# Patient Record
Sex: Female | Born: 1980 | Race: Black or African American | Hispanic: No | Marital: Single | Smoking: Never smoker
Health system: Southern US, Community
[De-identification: ages and names within clinical notes are randomized; demographics above are authoritative.]

## PROBLEM LIST (undated history)

## (undated) DIAGNOSIS — R600 Localized edema: Secondary | ICD-10-CM

## (undated) DIAGNOSIS — E7800 Pure hypercholesterolemia, unspecified: Secondary | ICD-10-CM

## (undated) DIAGNOSIS — R7303 Prediabetes: Secondary | ICD-10-CM

## (undated) DIAGNOSIS — F419 Anxiety disorder, unspecified: Secondary | ICD-10-CM

## (undated) DIAGNOSIS — R0602 Shortness of breath: Secondary | ICD-10-CM

## (undated) DIAGNOSIS — K5900 Constipation, unspecified: Secondary | ICD-10-CM

## (undated) DIAGNOSIS — N979 Female infertility, unspecified: Secondary | ICD-10-CM

## (undated) DIAGNOSIS — I10 Essential (primary) hypertension: Secondary | ICD-10-CM

## (undated) DIAGNOSIS — M549 Dorsalgia, unspecified: Secondary | ICD-10-CM

## (undated) DIAGNOSIS — K219 Gastro-esophageal reflux disease without esophagitis: Secondary | ICD-10-CM

## (undated) DIAGNOSIS — Z8742 Personal history of other diseases of the female genital tract: Secondary | ICD-10-CM

## (undated) DIAGNOSIS — R079 Chest pain, unspecified: Secondary | ICD-10-CM

## (undated) HISTORY — DX: Pure hypercholesterolemia, unspecified: E78.00

## (undated) HISTORY — DX: Female infertility, unspecified: N97.9

## (undated) HISTORY — DX: Anxiety disorder, unspecified: F41.9

## (undated) HISTORY — DX: Shortness of breath: R06.02

## (undated) HISTORY — PX: DILATION AND CURETTAGE OF UTERUS: SHX78

## (undated) HISTORY — DX: Personal history of other diseases of the female genital tract: Z87.42

## (undated) HISTORY — DX: Localized edema: R60.0

## (undated) HISTORY — DX: Dorsalgia, unspecified: M54.9

## (undated) HISTORY — DX: Prediabetes: R73.03

## (undated) HISTORY — DX: Gastro-esophageal reflux disease without esophagitis: K21.9

## (undated) HISTORY — DX: Constipation, unspecified: K59.00

## (undated) HISTORY — DX: Chest pain, unspecified: R07.9

## (undated) HISTORY — PX: NO PAST SURGERIES: SHX2092

---

## 2013-04-21 ENCOUNTER — Encounter: Payer: Self-pay | Admitting: Maternal & Fetal Medicine

## 2013-05-24 ENCOUNTER — Encounter: Payer: Self-pay | Admitting: Pediatric Cardiology

## 2013-06-23 ENCOUNTER — Encounter: Payer: Self-pay | Admitting: Obstetrics and Gynecology

## 2013-06-23 LAB — FERRITIN: FERRITIN (ARMC): 10 ng/mL (ref 8–388)

## 2013-06-23 LAB — T4, FREE: Free Thyroxine: 0.84 ng/dL (ref 0.76–1.46)

## 2013-06-23 LAB — TSH: Thyroid Stimulating Horm: 4 u[IU]/mL

## 2013-06-27 ENCOUNTER — Encounter: Payer: Self-pay | Admitting: Maternal & Fetal Medicine

## 2013-06-30 ENCOUNTER — Encounter: Payer: Self-pay | Admitting: Maternal & Fetal Medicine

## 2013-07-04 ENCOUNTER — Observation Stay: Payer: Self-pay

## 2013-07-07 ENCOUNTER — Encounter: Payer: Self-pay | Admitting: Maternal and Fetal Medicine

## 2013-07-11 ENCOUNTER — Encounter: Payer: Self-pay | Admitting: Maternal & Fetal Medicine

## 2013-07-12 ENCOUNTER — Inpatient Hospital Stay: Payer: Self-pay

## 2013-07-12 LAB — CBC WITH DIFFERENTIAL/PLATELET
BASOS PCT: 1.3 %
Basophil #: 0.1 10*3/uL (ref 0.0–0.1)
EOS ABS: 0.1 10*3/uL (ref 0.0–0.7)
EOS PCT: 1.3 %
HCT: 33.4 % — ABNORMAL LOW (ref 35.0–47.0)
HGB: 10.8 g/dL — AB (ref 12.0–16.0)
LYMPHS PCT: 16 %
Lymphocyte #: 1.2 10*3/uL (ref 1.0–3.6)
MCH: 26.8 pg (ref 26.0–34.0)
MCHC: 32.2 g/dL (ref 32.0–36.0)
MCV: 83 fL (ref 80–100)
Monocyte #: 0.7 x10 3/mm (ref 0.2–0.9)
Monocyte %: 10 %
NEUTROS ABS: 5.2 10*3/uL (ref 1.4–6.5)
NEUTROS PCT: 71.4 %
Platelet: 272 10*3/uL (ref 150–440)
RBC: 4.01 10*6/uL (ref 3.80–5.20)
RDW: 13.9 % (ref 11.5–14.5)
WBC: 7.2 10*3/uL (ref 3.6–11.0)

## 2013-07-14 LAB — PLATELET COUNT: Platelet: 260 10*3/uL (ref 150–440)

## 2013-07-16 LAB — HEMATOCRIT: HCT: 30.7 % — ABNORMAL LOW (ref 35.0–47.0)

## 2014-04-24 ENCOUNTER — Emergency Department: Payer: Self-pay | Admitting: Emergency Medicine

## 2014-06-03 NOTE — Consult Note (Signed)
Referral Information:  Reason for Referral Chronic hypertension in pregnancy   Referring Physician [facility] Department   Prenatal Hx Sarah Burke is a 34 year-old G1 P0 at 51 2/7 weeks ([facility] 08/02/13) who presents for recommendations concerning the management of chronic hypertension in pregnancy. Sarah Burke was diagnosed with chronic hypertension approximately 5-6 years ago but has never required medications. She denies any end-organ complications associated with hypertension.  She also was recently diagnosed with polycystic ovarian syndrome, shortly before becoming pregnant.  She had an early three hour glucose tolerance test this pregnancy, which was normal.  She initiated her prenatal care in [place] but recently moved to [place] to be close to family.  Her BP at [facility] entry was 130/80.   Past Obstetrical Hx G1 P0   Home Medications: Medication Instructions Status  Prenatal Multivitamins Prenatal Multivitamins oral tablet 1 tab(s) orally once a day Active   Allergies:   No Known Allergies:   Vital Signs/Notes:  Nursing Vital Signs: **Vital Signs.:   12-Mar-15 08:00  Pulse Pulse 97  Systolic BP Systolic BP 135  Diastolic BP (mmHg) Diastolic BP (mmHg) 79   Perinatal Consult:  PGyn Hx Denies history of abnormal paps. She has had a remote history of chlamydia that was treated   Past Medical History cont'd Chronic HTN as in HPI Denies history of thyroid disorders or diabetes   PSurg Hx Wisdom teeth extraction, no complications   FHx Denies FH of birth defects, genetic disorders or mental retardation.   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 Lives with family   Occupation Father Lives in [place]. Not involved in the pregnancy currently   Soc Hx single, Denies use of ETOH, tobacco or drugs   Review Of Systems:  Subjective No complaints   Fever/Chills No   Abdominal Pain No   Nausea/Vomiting No   SOB/DOE No   Chest Pain No   Medications/Allergies Reviewed  Medications/Allergies reviewed   Exam:  Today's Weight 218, BMI 39.8    Additional Lab/Radiology Notes Prenatal labs (obtained from prenatal record): Blood type A positive, Antibody screen negative, RPR non-reactive, Hep B non-reactive, Rubella immune, Sickle screen normal, HIV non-reactive, GC/Chl negative, Hgb 12.2, Hgb electorphoresis normal, Early three hour GTT: 80/112/108/45, Varicella immune, Uric acid 4.5, AST 39, Plt count 369, Urine culture no growth  First trimester screen: Down syndrome risk 1 in 19,000, Trisomy 18 risk less than 1 in 100,000  MS AFP: Normal   Impression/Recommendations:  Impression 34 year-old G1 P0 at 25 2/7 weeks with history of chronic hypertension not requiring medications, obesity, and 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   Recommendations 1. Chronic hypertension.  Sarah Burke has never required antihypertensive therapy. It appears that her BPs during this pregnancy have also remained less than 140/90. Given her diagnosis and in the setting, I have recommended the use of  daily baby aspirin ( ) to decrease the risk for preeclampsia.  Baseline labs are normal. Please check urine protein as well if not already done. We reviewed the increased risk for preeclampsia as well as other complications associated with hypertension (maternal stroke, poor fetal growth, placental abruption, stillbirth).  If blood pressures remain less than 140/90, do not recommend initiating anti-hypertensive therapy.  If blood pressures increase to over 140/90 on two measurements, start labetalol 100 mg twice daily and refer back to [place] to discuss management for the remainder of the pregnancy.  If blood pressures remain normal, recommned a growth scan in the  third trimester (scheduled), weekly fetal testing to start at 36 weeks and delivery by due date. If requires anti-hypertensive therapy, refer back to develop further plan.  Recommned EKG. If  evidence of left ventricular hypertrophy, then recommend maternal echo as well.  2. Obesity. We reviewed complications associated with obesity and appropriate weight gain. Please repeat glucola at approximately 28 weeks to screen for gestational diabetes. We have recommended starting a daily baby aspirin.  3. Two vessel cord. We reviewed the implications of two vessel cord (see US report). She had normal first trimester screening and does not desire further genetic testing.  We have scheduled her for a fetal echo and growth scan.  4. The head measurements are as follows: BPD < 5% (though not 2.5 SD below the mean) and head circumference is at the 6%. The intracranial anatomy appears normal. Sarah Burke states that the father of the baby has a "small head".  This likely represents a normal finding.    Total Time Spent with Patient 60 minutes   >50% of visit spent in couseling/coordination of care yes   Office Use Only 99244  Level 4 (60min) NEW office consult low complexity   Coding Description: FETAL - 2nd/3rd TRIMESTER INDICATION(S).   Umbilical cord, 2 vessels, SUA (FDC echo indication).   MATERNAL CONDITIONS/HISTORY INDICATION(S).   Chronic HTN.   Obesity - BMI greater than equal to 30.  Electronic Signatures: Sarah Burke, [place] (MD)  (Signed 12-Mar-15 10:51)  Authored: Referral, Home Medications, Allergies, Vital Signs/Notes, Consult, Exam, Lab/Radiology Notes, Impression, Billing, Coding Description   Last Updated: 12-Mar-15 10:51 by Sarah Burke, [place] (MD)

## 2016-10-04 ENCOUNTER — Emergency Department
Admission: EM | Admit: 2016-10-04 | Discharge: 2016-10-04 | Disposition: A | Discharge status: Home / Self Care | Payer: Medicaid Other | Attending: Emergency Medicine | Admitting: Emergency Medicine

## 2016-10-04 DIAGNOSIS — R29810 Facial weakness: Secondary | ICD-10-CM | POA: Diagnosis present

## 2016-10-04 DIAGNOSIS — G510 Bell's palsy: Secondary | ICD-10-CM | POA: Insufficient documentation

## 2016-10-04 DIAGNOSIS — I10 Essential (primary) hypertension: Secondary | ICD-10-CM | POA: Insufficient documentation

## 2016-10-04 HISTORY — DX: Essential (primary) hypertension: I10

## 2016-10-04 MED ORDER — PREDNISONE 20 MG PO TABS: 60.0000 mg | ORAL_TABLET | Freq: Every day | ORAL | 0 refills | Status: AC

## 2016-10-04 MED ORDER — VALACYCLOVIR HCL 1 G PO TABS: 1000.0000 mg | ORAL_TABLET | Freq: Three times a day (TID) | ORAL | 0 refills | Status: AC

## 2016-10-04 MED ORDER — PREDNISONE 20 MG PO TABS
60.0000 mg | ORAL_TABLET | Freq: Once | ORAL | Status: AC
  Administered 2016-10-04: 60 mg via ORAL
  Filled 2016-10-04: qty 3

## 2016-10-04 MED ORDER — ARTIFICIAL TEARS OPHTHALMIC OINT: TOPICAL_OINTMENT | OPHTHALMIC | 2 refills | Status: AC

## 2016-10-04 NOTE — ED Notes (Signed)
Pt reports swelling to right side of face after eating food that she later found mold on 2 days ago.  Very slight swelling noted when compared to left side of face, throat inspected and no swelling noted.  Pt denies any problems breathing.  Pt c/o right sided headache.

## 2016-10-04 NOTE — ED Provider Notes (Signed)
[facility] Emergency Department Provider Note  ____________________________________________   I have reviewed the triage vital signs and the nursing notes.   HISTORY  Chief Complaint Facial Swelling    HPI Sarah Burke is a 36 y.o. female  Who presents today complaining that she has some degree of weakness in her right face. It began gradually yesterday. She has had tearing from her right eye, and she cannot taste anything. She denies any other neurologic deficit. She states that her face feels "funny". Is not swollen percent is no fever. There is not a lot of pain , rather there is aa sensation of uncomfortableness.no recent tick bites or travel no recent or historical herpetic lesions.  He states that she is having some tearing from her right eye.   Past Medical History:  Diagnosis Date  . Hypertension     There are no active problems to display for this patient.   History reviewed. No pertinent surgical history.  Prior to Admission medications   Not on File    Allergies Patient has no known allergies.  Family History  Problem Relation Age of Onset  . Hypertension Mother   . Diabetes Brother     Social History Social History  Substance Use Topics  . Smoking status: Never Smoker  . Smokeless tobacco: Never Used  . Alcohol use Yes     Comment: occassional    Review of Systems Constitutional: No fever/chills Eyes: No visual changes. ENT: No sore throat. No stiff neck no neck pain Cardiovascular: Denies chest pain. Respiratory: Denies shortness of breath. Gastrointestinal:   no vomiting.  No diarrhea.  No constipation. Genitourinary: Negative for dysuria. Musculoskeletal: Negative lower extremity swelling Skin: Negative for rash. Neurological: Negative for severe headaches, focal weakness or numbness.   ____________________________________________   PHYSICAL EXAM:  VITAL SIGNS: ED Triage Vitals  Enc Vitals Group   BP 10/04/16 1926 (!) 150/90     Pulse Rate 10/04/16 1926 66     Resp 10/04/16 1926 18     Temp 10/04/16 1926 98.9 F (37.2 C)     Temp Source 10/04/16 1926 Oral     SpO2 10/04/16 1926 99 %     Weight 10/04/16 1926 230 lb (104.3 kg)     Height 10/04/16 1926 5\' 2"  (1.575 m)     Head Circumference --      Peak Flow --      Pain Score 10/04/16 1925 9     Pain Loc --      Pain Edu? --      Excl. in GC? --     Constitutional: Alert and oriented. Well appearing and in no acute distress. Eyes: Conjunctivae are normal Head: Atraumatic HEENT: No congestion/rhinnorhea. Mucous membranes are moist.  Oropharynx non-erythematous Neck:   Nontender with no meningismus, no masses, no stridor Cardiovascular: Normal rate, regular rhythm. Grossly normal heart sounds.  Good peripheral circulation. Respiratory: Normal respiratory effort.  No retractions. Lungs CTAB. Abdominal: Soft and nontender. No distention. No guarding no rebound Back:  There is no focal tenderness or step off.  there is no midline tenderness there are no lesions noted. there is no CVA tenderness Musculoskeletal: No lower extremity tenderness, no upper extremity tenderness. No joint effusions, no DVT signs strong distal pulses no edema Neurologic:  Cranial nerves II through XII are grossly intact, with the exception of the seventh nerve which does involve the forehead. There is weakness on that side per she can close her eye  on the right completely however there is some degree of weakness associated with that as well. Sensation appears to be intact bilaterally. 5 out of 5 strength bilateral upper and lower extremity. Finger to nose within normal limits heel to shin within normal limits, speech is normal with no word finding difficulty or dysarthria, reflexes symmetric, pupils are equally round and reactive to light, there is no pronator drift, sensation is normal, vision is intact to confrontation, gait is deferred, there is no nystagmus,  normal neurologic exam no herpetic lesions noted Skin:  Skin is warm, dry and intact. No rash noted. Psychiatric: Mood and affect are normal. Speech and behavior are normal.  ____________________________________________   LABS (all labs ordered are listed, but only abnormal results are displayed)  Labs Reviewed - No data to display ____________________________________________  EKG  I personally interpreted any EKGs ordered by me or triage  ____________________________________________  RADIOLOGY  I reviewed any imaging ordered by me or triage that were performed during my shift and, if possible, patient and/or family made aware of any abnormal findings. ____________________________________________   PROCEDURES  Procedure(s) performed: None  Procedures  Critical Care performed: None  ____________________________________________   INITIAL IMPRESSION / ASSESSMENT AND PLAN / ED COURSE  Pertinent labs & imaging results that were available during my care of the patient were reviewed by me and considered in my medical decision making (see chart for details).  Patient here with what appears to be a Bell's palsy very low suspicion for centrally mediated CVA. Isolated Bell's palsy symptoms with no evidence of significant ocular involvement. We will start her on steroids, and valacyclovir although the studies are somewhat limited in terms of utility of this medication it is well tolerated. We'll start her on eyedrops, and close outpatient follow-up with neurology is advised as well as I did advise her to actually tape her eye shut if she cannot keep it closed while she sleeps. No evidence of ocular pathology at this time.    ____________________________________________   FINAL CLINICAL IMPRESSION(S) / ED DIAGNOSES  Final diagnoses:  None      This chart was dictated using voice recognition software.  Despite best efforts to proofread,  errors can occur which can change  meaning.      Jeanmarie Plant, MD 10/04/16 [phone]

## 2016-10-04 NOTE — ED Triage Notes (Signed)
Patient reports eating fast food yesterday and says she found mold on her food.    Patient reports approx 20 minutes later her eyes began to run and symptoms slowly progressed.  Patient currently c/o right-sided facial swelling, right throat discomfort/pressure, headache.   Patient able to speak in full sentences, maintain secretions, denies SOB and throat tightness.

## 2016-10-06 DIAGNOSIS — Z8669 Personal history of other diseases of the nervous system and sense organs: Secondary | ICD-10-CM | POA: Insufficient documentation

## 2016-10-06 DIAGNOSIS — G510 Bell's palsy: Secondary | ICD-10-CM

## 2017-07-29 DIAGNOSIS — F4323 Adjustment disorder with mixed anxiety and depressed mood: Secondary | ICD-10-CM | POA: Insufficient documentation

## 2017-12-31 ENCOUNTER — Other Ambulatory Visit (HOSPITAL_COMMUNITY)
Admission: RE | Admit: 2017-12-31 | Discharge: 2017-12-31 | Disposition: A | Discharge status: Home / Self Care | Payer: Medicaid Other | Source: Ambulatory Visit | Attending: Family Medicine | Admitting: Family Medicine

## 2017-12-31 ENCOUNTER — Ambulatory Visit: Payer: Medicaid Other | Admitting: Family Medicine

## 2017-12-31 ENCOUNTER — Encounter: Payer: Self-pay | Admitting: Family Medicine

## 2017-12-31 VITALS — BP 124/90 | HR 100 | Temp 98.3°F | Resp 16 | Ht 63.5 in | Wt 247.3 lb

## 2017-12-31 DIAGNOSIS — R102 Pelvic and perineal pain unspecified side: Secondary | ICD-10-CM

## 2017-12-31 DIAGNOSIS — G510 Bell's palsy: Secondary | ICD-10-CM

## 2017-12-31 DIAGNOSIS — R350 Frequency of micturition: Secondary | ICD-10-CM

## 2017-12-31 DIAGNOSIS — E66813 Obesity, class 3: Secondary | ICD-10-CM

## 2017-12-31 DIAGNOSIS — Z23 Encounter for immunization: Secondary | ICD-10-CM | POA: Diagnosis not present

## 2017-12-31 DIAGNOSIS — F4323 Adjustment disorder with mixed anxiety and depressed mood: Secondary | ICD-10-CM

## 2017-12-31 DIAGNOSIS — Z6841 Body Mass Index (BMI) 40.0 and over, adult: Secondary | ICD-10-CM | POA: Insufficient documentation

## 2017-12-31 NOTE — Progress Notes (Signed)
New Patient Office Visit  Subjective:  Patient ID: Sarah Burke, female    DOB: 04/03/1980  Age: 37 y.o. MRN: [id number]  CC:  Chief Complaint  Patient presents with  . Establish Care  . Vaginitis    recurrent bacteria infections    HPI Sarah Burke presents for establish care and for recurrent BV and Obesity.  Recurrent BV: She notes 1 new partner in the last year.  She notes new symptom onset a few weeks ago - went to urgent care and was given Abx, completed medications.  Still having vaginal pain, but no foul odor any more.  She was supposed to start her cycle a few days ago but it has not started. She states she was diagnosed with PCOS, but doesn't have records from previous PCP in [place].   Obesity: She was diagnosed with PCOS in [place], she was going to the gym daily for a month, gained weight and became frustrated, and stopped going to the gym.  She is open to seeing an nutritionist - we will refer and obtain labs.  History Bell's Palsey: She still has some tension on the right side, worsening over the last month - has been seeing neurology - is on a 1 year follow up at this point. She would like to take a steroid again to see if it helps this tension that has been worsening over the last month.  Reviewed notes from Dot Lanes NP, advised pt to call neurology office today.  Situational Anxiety and Depression: When she developed Bells Palsy last year, she started to reduce her stress, cut a lot of people out of her life. She says she has lost a lot of her desire to go out and socialize like she used to, still contacting family multiple times a week and seeing them in person frequently. Declines medication today, declines counseling today. PHQ-9 score of 1 today.  Denies SI/HI.   Past Medical History:  Diagnosis Date  . Hypertension     Past Surgical History:  Procedure Laterality Date  . NO PAST SURGERIES      Family History  Problem Relation Age of Onset    . Hypertension Mother   . Diabetes Brother     Social History   Socioeconomic History  . Marital status: Single    Spouse name: Not on file  . Number of children: 1  . Years of education: Not on file  . Highest education level: Not on file  Occupational History  . Not on file  Social Needs  . Financial resource strain: Not hard at all  . Food insecurity:    Worry: Never true    Inability: Never true  . Transportation needs:    Medical: No    Non-medical: No  Tobacco Use  . Smoking status: Never Smoker  . Smokeless tobacco: Never Used  Substance and Sexual Activity  . Alcohol use: Yes    Comment: occassional  . Drug use: Never  . Sexual activity: Not Currently    Partners: Male  Lifestyle  . Physical activity:    Days per week: 0 days    Minutes per session: 0 min  . Stress: Not at all  Relationships  . Social connections:    Talks on phone: More than three times a week    Gets together: More than three times a week    Attends religious service: 1 to 4 times per year    Active member of club or  organization: No    Attends meetings of clubs or organizations: Never    Relationship status: Never married  . Intimate partner violence:    Fear of current or ex partner: No    Emotionally abused: No    Physically abused: No    Forced sexual activity: No  Other Topics Concern  . Not on file  Social History Narrative  . Not on file    ROS Review of Systems  Constitutional: Negative for fatigue, fever and unexpected weight change.  HENT: Negative.   Eyes: Negative.   Respiratory: Negative for cough and shortness of breath.   Cardiovascular: Negative for chest pain.  Gastrointestinal: Negative for abdominal pain, blood in stool, constipation, diarrhea, nausea and vomiting.  Endocrine: Negative for heat intolerance.  Genitourinary: Positive for frequency. Negative for dysuria and flank pain.  Musculoskeletal: Negative for back pain and myalgias.  Skin: Negative.    Neurological: Positive for facial asymmetry. Negative for speech difficulty and numbness.  Psychiatric/Behavioral: Negative.   All other systems reviewed and are negative.   Objective:   Today's Vitals: BP 124/90 (BP Location: Right Arm, Patient Position: Sitting, Cuff Size: Large)   Pulse 100   Temp 98.3 F (36.8 C) (Oral)   Resp 16   Ht 5' 3.5" (1.613 m)   Wt 247 lb 4.8 oz (112.2 kg)   LMP 11/30/2017   SpO2 99%   BMI 43.12 kg/m   Physical Exam  Constitutional: Patient appears well-developed and well-nourished. No distress.  HENT: Head: Normocephalic and atraumatic. Ears: bilateral TMs with no erythema or effusion; Nose: Nose normal. Mouth/Throat: Oropharynx is clear and moist. No oropharyngeal exudate or tonsillar swelling.  Eyes: Conjunctivae and EOM are normal. No scleral icterus.  Pupils are equal, round, and reactive to light.  Neck: Normal range of motion. Neck supple. No JVD present. No thyromegaly present.  Cardiovascular: Normal rate, regular rhythm and normal heart sounds.  No murmur heard. No BLE edema. Pulmonary/Chest: Effort normal and breath sounds normal. No respiratory distress. Abdominal: Soft. Bowel sounds are normal, no distension. There is no tenderness. No masses. Musculoskeletal: Normal range of motion, no joint effusions. No gross deformities Neurological: Pt is alert and oriented to person, place, and time. Right eye has lower lid that is slightly raised and upper lid opens slightly less compared tot he left, otherwise no cranial nerve deficit. Coordination, balance, strength, speech and gait are normal.  Skin: Skin is warm and dry. No rash noted. No erythema.  Psychiatric: Patient has a normal mood and affect. behavior is normal. Judgment and thought content normal.  [facility] 12/31/2017  Decreased Interest 0  Down, Depressed, Hopeless 0  PHQ - 2 Score 0  Altered sleeping 1  Tired, decreased energy 0  Change in appetite 0  Feeling  bad or failure about yourself  0  Trouble concentrating 0  Moving slowly or fidgety/restless 0  Suicidal thoughts 0  PHQ-9 Score 1  Difficult doing work/chores Not difficult at all    Assessment & Plan:   Problem List Items Addressed This Visit      Nervous and Auditory   Facial paralysis/Bells palsy    She does have mild asymmetry to the right side of her face - eye and lip specifically. She will call her neurologist today to discuss current flare.  No other focal neuro deficit, current symptoms have been ongoing for several months, so we will not refer to ER today.  Other   Situational mixed anxiety and depressive disorder    Declines medication or counseling today. PHQ-9 is normal      Class 3 severe obesity due to excess calories with serious comorbidity and body mass index (BMI) of 40.0 to 44.9 in adult [facility]) - Primary   Relevant Orders   Amb ref to Medical Nutrition Therapy-MNT   Lipid panel   Insulin, random   COMPLETE METABOLIC PANEL WITH GFR   CBC w/Diff/Platelet   TSH   Hemoglobin A1c   Vaginal pain   Relevant Orders   Cervicovaginal ancillary only   Urinary frequency   Relevant Orders   Urine Culture    Other Visit Diagnoses    Need for Tdap vaccination       Relevant Orders   Tdap vaccine greater than or equal to 7yo IM (Completed)   Need for influenza vaccination       Relevant Orders   Flu Vaccine QUAD 6+ mos PF IM (Fluarix Quad PF) (Completed)     Outpatient Encounter Medications as of 12/31/2017  Medication Sig  . acetaminophen (TYLENOL) 500 MG tablet Take by mouth.   No facility-administered encounter medications on file as of 12/31/2017.     Follow-up: Return for 1 month CPE/50month follow up.   Doren Custard, FNP

## 2017-12-31 NOTE — Assessment & Plan Note (Signed)
Declines medication or counseling today. PHQ-9 is normal

## 2017-12-31 NOTE — Patient Instructions (Addendum)
Alphonzo LemmingsPotter, Zachary Eric, MD  [phone]  [facility]  [place] [address]  [phone]  [phone] ([address])    Jairo BenStepp, Ashley Renee, NP  [address]  [place], [place] [address]  [phone]  [phone] (Fax)   Please call your neurologist today regarding your increased tension on the right side of your face.

## 2017-12-31 NOTE — Assessment & Plan Note (Signed)
She does have mild asymmetry to the right side of her face - eye and lip specifically. She will call her neurologist today to discuss current flare.  No other focal neuro deficit, current symptoms have been ongoing for several months, so we will not refer to ER today.

## 2018-01-01 LAB — INSULIN, RANDOM: Insulin: 15.6 u[IU]/mL (ref 2.0–19.6)

## 2018-01-01 LAB — COMPLETE METABOLIC PANEL WITH GFR
AG RATIO: 1.4 (calc) (ref 1.0–2.5)
ALBUMIN MSPROF: 4.5 g/dL (ref 3.6–5.1)
ALT: 9 U/L (ref 6–29)
AST: 12 U/L (ref 10–30)
Alkaline phosphatase (APISO): 62 U/L (ref 33–115)
BILIRUBIN TOTAL: 0.4 mg/dL (ref 0.2–1.2)
BUN: 9 mg/dL (ref 7–25)
CHLORIDE: 104 mmol/L (ref 98–110)
CO2: 25 mmol/L (ref 20–32)
Calcium: 9.3 mg/dL (ref 8.6–10.2)
Creat: 0.84 mg/dL (ref 0.50–1.10)
GFR, EST AFRICAN AMERICAN: 104 mL/min/{1.73_m2} (ref >=60)
GFR, Est Non African American: 89 mL/min/{1.73_m2} (ref >=60)
GLUCOSE: 84 mg/dL (ref 65–99)
Globulin: 3.2 g/dL (calc) (ref 1.9–3.7)
Potassium: 4.3 mmol/L (ref 3.5–5.3)
Sodium: 137 mmol/L (ref 135–146)
TOTAL PROTEIN: 7.7 g/dL (ref 6.1–8.1)

## 2018-01-01 LAB — LIPID PANEL
CHOLESTEROL: 182 mg/dL (ref <200)
HDL: 48 mg/dL — AB (ref >50)
LDL Cholesterol (Calc): 116 mg/dL (calc) — ABNORMAL HIGH
Non-HDL Cholesterol (Calc): 134 mg/dL (calc) — ABNORMAL HIGH (ref <130)
TRIGLYCERIDES: 84 mg/dL (ref <150)
Total CHOL/HDL Ratio: 3.8 (calc) (ref <5.0)

## 2018-01-01 LAB — CERVICOVAGINAL ANCILLARY ONLY
BACTERIAL VAGINITIS: NEGATIVE
Candida vaginitis: POSITIVE — AB
Chlamydia: NEGATIVE
Neisseria Gonorrhea: NEGATIVE
Trichomonas: NEGATIVE

## 2018-01-01 LAB — CBC WITH DIFFERENTIAL/PLATELET
BASOS ABS: 29 {cells}/uL (ref 0–200)
Basophils Relative: 0.5 %
EOS PCT: 1.2 %
Eosinophils Absolute: 68 cells/uL (ref 15–500)
HCT: 38.1 % (ref 35.0–45.0)
Hemoglobin: 12.5 g/dL (ref 11.7–15.5)
LYMPHS ABS: 1568 {cells}/uL (ref 850–3900)
MCH: 27.3 pg (ref 27.0–33.0)
MCHC: 32.8 g/dL (ref 32.0–36.0)
MCV: 83.2 fL (ref 80.0–100.0)
MPV: 10.9 fL (ref 7.5–12.5)
Monocytes Relative: 8.1 %
NEUTROS PCT: 62.7 %
Neutro Abs: 3574 cells/uL (ref 1500–7800)
PLATELETS: 339 10*3/uL (ref 140–400)
RBC: 4.58 10*6/uL (ref 3.80–5.10)
RDW: 12.5 % (ref 11.0–15.0)
Total Lymphocyte: 27.5 %
WBC mixed population: 462 cells/uL (ref 200–950)
WBC: 5.7 10*3/uL (ref 3.8–10.8)

## 2018-01-01 LAB — HEMOGLOBIN A1C
HEMOGLOBIN A1C: 5.8 %{Hb} — AB (ref <5.7)
MEAN PLASMA GLUCOSE: 120 (calc)
eAG (mmol/L): 6.6 (calc)

## 2018-01-01 LAB — TSH: TSH: 2.1 m[IU]/L

## 2018-01-01 LAB — URINE CULTURE
MICRO NUMBER: 91405046
Result:: NO GROWTH
SPECIMEN QUALITY: ADEQUATE

## 2018-01-02 ENCOUNTER — Other Ambulatory Visit: Payer: Self-pay | Admitting: Family Medicine

## 2018-01-02 DIAGNOSIS — B3731 Acute candidiasis of vulva and vagina: Secondary | ICD-10-CM

## 2018-01-02 DIAGNOSIS — B373 Candidiasis of vulva and vagina: Secondary | ICD-10-CM

## 2018-01-02 MED ORDER — FLUCONAZOLE 150 MG PO TABS: 150.0000 mg | ORAL_TABLET | Freq: Once | ORAL | 0 refills | Status: AC

## 2018-01-04 ENCOUNTER — Encounter: Payer: Self-pay | Admitting: Family Medicine

## 2018-01-04 DIAGNOSIS — R7303 Prediabetes: Secondary | ICD-10-CM | POA: Insufficient documentation

## 2018-01-04 DIAGNOSIS — E8881 Metabolic syndrome: Secondary | ICD-10-CM | POA: Insufficient documentation

## 2018-01-04 DIAGNOSIS — E782 Mixed hyperlipidemia: Secondary | ICD-10-CM | POA: Insufficient documentation

## 2018-01-05 ENCOUNTER — Encounter: Payer: Self-pay | Admitting: Family Medicine

## 2018-01-11 ENCOUNTER — Telehealth: Payer: Self-pay

## 2018-01-11 DIAGNOSIS — R7303 Prediabetes: Secondary | ICD-10-CM

## 2018-01-11 DIAGNOSIS — Z6841 Body Mass Index (BMI) 40.0 and over, adult: Secondary | ICD-10-CM

## 2018-01-11 DIAGNOSIS — E782 Mixed hyperlipidemia: Secondary | ICD-10-CM

## 2018-01-11 DIAGNOSIS — E8881 Metabolic syndrome: Secondary | ICD-10-CM

## 2018-01-11 NOTE — Telephone Encounter (Signed)
Copied from CRM [id number]. Topic: Quick Communication - Rx Refill/Question >> Jan 11, 2018 12:14 PM Crist InfanteHarrald, Kathy J wrote: Medication:  Ozempic   Pt sent a mychart message and not sure when she is supposed to come in to learn how to do the pen? Does she need to wait until she picks up Rx and then come in?  Pt states she does not know what to do.

## 2018-01-12 MED ORDER — SEMAGLUTIDE(0.25 OR 0.5MG/DOS) 2 MG/1.5ML ~~LOC~~ SOPN: PEN_INJECTOR | SUBCUTANEOUS | 1 refills | Status: AC

## 2018-01-12 NOTE — Telephone Encounter (Signed)
Please let Ms. Openshaw know that [facility]  She can come in once the PA is completed and she has the pen from the pharmacy for a nurse visit to learn how to use the pen. I have also referred her to nutritionist.  Keep her upcoming follow up appointment with me.

## 2018-01-14 NOTE — Telephone Encounter (Signed)
Patient notified, will come by office once PA is approved. Medication still on hold at pharmacy

## 2018-01-25 ENCOUNTER — Encounter: Payer: Self-pay | Admitting: Dietician

## 2018-01-25 ENCOUNTER — Encounter: Payer: Medicaid Other | Attending: Family Medicine | Admitting: Dietician

## 2018-01-25 VITALS — Ht 63.0 in | Wt 243.2 lb

## 2018-01-25 DIAGNOSIS — Z6841 Body Mass Index (BMI) 40.0 and over, adult: Secondary | ICD-10-CM | POA: Diagnosis not present

## 2018-01-25 DIAGNOSIS — Z713 Dietary counseling and surveillance: Secondary | ICD-10-CM | POA: Insufficient documentation

## 2018-01-25 DIAGNOSIS — R7303 Prediabetes: Secondary | ICD-10-CM | POA: Diagnosis not present

## 2018-01-25 DIAGNOSIS — E785 Hyperlipidemia, unspecified: Secondary | ICD-10-CM | POA: Diagnosis not present

## 2018-01-25 DIAGNOSIS — E669 Obesity, unspecified: Secondary | ICD-10-CM | POA: Insufficient documentation

## 2018-01-25 DIAGNOSIS — E282 Polycystic ovarian syndrome: Secondary | ICD-10-CM | POA: Diagnosis not present

## 2018-01-25 DIAGNOSIS — I10 Essential (primary) hypertension: Secondary | ICD-10-CM | POA: Diagnosis not present

## 2018-01-25 NOTE — Progress Notes (Signed)
Medical Nutrition Therapy: Visit start time: 1430  end time: 1530  Assessment:  Diagnosis: pre-diabetes, obesity Past medical history: HTN, PCOS, hyperlipidemia Psychosocial issues/ stress concerns: none  Preferred learning method:  Jill Alexanders. Visual . Hands-on   Current weight: 243.2lbs Height: 5'3" Medications, supplements: reconciled list in medical record  Progress and evaluation: Patient reports past dieting history of 30-day cleanse diet; green smoothie cleanse; slim-fast -- with only short-term success. Reports craving carbohydrate foods since >4 years ago, and was diagnosed with PCOS. Has been working to reduce sugar intake, including sodas. She is concerned about knowing what she can eat and should not eat, and expanding the variety of foods she is eating. She had been exercising often but gained weight, and has drifted away from regular exercise.    Physical activity: cardio/ walking 30-60 minutes, 1-2x a week.   Dietary Intake:  Usual eating pattern includes 2-3 meals and 1 snacks per day. Dining out frequency: 0-2 meals per week.  Breakfast: occasionally skips; maple cheerios or oatmeal, or 1 pancake with banana no syrup, protein shake Snack: none recently Lunch: chicken strips cooked in air fryer + vegetables ie broccoli, spinach; [place]key sandwich with spinach Snack: none recently Supper: brown rice or whole grain pasta with meat Snack: low sodium triscuit crackers, gluten free pretzels Beverages: water, stopped sodas  Nutrition Care Education: Topics covered: weight control, diabetes prevention, hypertension Basic nutrition: basic food groups, appropriate nutrient balance, appropriate meal and snack schedule, general nutrition guidelines    Weight control: benefits of weight control, appropriate food portions and balance, importance of low sugar and low fat food choices; discussed food portions for about 1500kcal daily intake for weight loss, role of exercise Advanced nutrition:   recipe modification, cooking techniques, dining out, food label reading Diabetes:  appropriate meal and snack schedule, appropriate carb intake and balance, healthy carb choices Hypertension:  identifying high sodium foods, goal for daily sodium intake, identifying food sources of potassium, magnesium   Nutritional Diagnosis:  [place]-2.2 Altered nutrition-related laboratory As related to pre-diabetes.  As evidenced by patient with recent HbA1C of 5.8%. [place]-3.3 Overweight/obesity As related to excess calories, inactivity, PCOS.  As evidenced by patient with current BMI of 43, and patient report of diet and activity history.  Intervention:   Instruction as noted above.  Patient has been working on positive diet changes, and is motivated to continue.  Set goals with direction from patient.  Education Materials given:  . Plate Planner with food lists . Sample menus . Snacking handout . Goals/ instructions   Learner/ who was taught:  . Patient    Level of understanding: Marland Kitchen. Verbalizes/ demonstrates competency   Demonstrated degree of understanding via:   Teach back Learning barriers: . None  Willingness to learn/ readiness for change: . Eager, change in progress   Monitoring and Evaluation:  Dietary intake, exercise, BG control, and body weight      follow up: 03/08/18

## 2018-01-25 NOTE — Patient Instructions (Signed)
   Goal for sodium each day is 2000mg  or less, with each meal 500-600mg  average.   Include large portions of low-carb veggies and control portions of starchy foods.  Try some sugar free flavored waters such as [employer] Splash. Tea without sugar or 1 tsp or less is also a good option.   Resume regular exercise, gradually work up to 30 minutes or more 5x a week, or 150 or more minutes weekly.

## 2018-02-02 ENCOUNTER — Ambulatory Visit: Payer: Medicaid Other | Admitting: Family Medicine

## 2018-02-02 ENCOUNTER — Other Ambulatory Visit (HOSPITAL_COMMUNITY)
Admission: RE | Admit: 2018-02-02 | Discharge: 2018-02-02 | Disposition: A | Discharge status: Home / Self Care | Payer: Medicaid Other | Source: Ambulatory Visit | Attending: Family Medicine | Admitting: Family Medicine

## 2018-02-02 ENCOUNTER — Encounter: Payer: Self-pay | Admitting: Family Medicine

## 2018-02-02 VITALS — BP 134/86 | HR 105 | Temp 97.7°F | Resp 16 | Ht 63.5 in | Wt 241.1 lb

## 2018-02-02 DIAGNOSIS — Z113 Encounter for screening for infections with a predominantly sexual mode of transmission: Secondary | ICD-10-CM

## 2018-02-02 DIAGNOSIS — Z0000 Encounter for general adult medical examination without abnormal findings: Secondary | ICD-10-CM | POA: Diagnosis not present

## 2018-02-02 DIAGNOSIS — Z01419 Encounter for gynecological examination (general) (routine) without abnormal findings: Secondary | ICD-10-CM | POA: Insufficient documentation

## 2018-02-02 DIAGNOSIS — R7303 Prediabetes: Secondary | ICD-10-CM

## 2018-02-02 DIAGNOSIS — Z1159 Encounter for screening for other viral diseases: Secondary | ICD-10-CM | POA: Diagnosis not present

## 2018-02-02 DIAGNOSIS — Z6841 Body Mass Index (BMI) 40.0 and over, adult: Secondary | ICD-10-CM

## 2018-02-02 DIAGNOSIS — E8881 Metabolic syndrome: Secondary | ICD-10-CM

## 2018-02-02 DIAGNOSIS — G4719 Other hypersomnia: Secondary | ICD-10-CM

## 2018-02-02 DIAGNOSIS — Z1231 Encounter for screening mammogram for malignant neoplasm of breast: Secondary | ICD-10-CM

## 2018-02-02 DIAGNOSIS — R0683 Snoring: Secondary | ICD-10-CM

## 2018-02-02 MED ORDER — METFORMIN HCL ER 500 MG PO TB24: 500.0000 mg | ORAL_TABLET | Freq: Every day | ORAL | 0 refills | Status: DC

## 2018-02-02 NOTE — Progress Notes (Signed)
Name: Sarah Burke   MRN: [id number]    DOB: 01-Oct-1980   Date:02/02/2018       Progress Note  Subjective  Chief Complaint  Chief Complaint  Patient presents with  . Annual Exam    HPI  Patient presents for annual CPE.  Diet: She saw nutritionist recently and liked it, is now trying to eat vegetarian - decreased sugar and salt intake.  Exercise: Doing small exercises at home while watching her daugher   USPSTF grade A and B recommendations    Office Visit from 12/31/2017 in [facility]  AUDIT-C Score  1     Depression:  Depression screen [facility] 2/9 02/02/2018 01/25/2018 12/31/2017  Decreased Interest 0 0 0  Down, Depressed, Hopeless 0 0 0  PHQ - 2 Score 0 0 0  Altered sleeping 0 - 1  Tired, decreased energy 0 - 0  Change in appetite 0 - 0  Feeling bad or failure about yourself  0 - 0  Trouble concentrating 0 - 0  Moving slowly or fidgety/restless 0 - 0  Suicidal thoughts 0 - 0  PHQ-9 Score 0 - 1  Difficult doing work/chores Not difficult at all - Not difficult at all   Hypertension: BP Readings from Last 3 Encounters:  02/02/18 134/86  12/31/17 124/90  10/04/16 (!) 135/91   Obesity: Down 6lbs since last visit. Wt Readings from Last 3 Encounters:  02/02/18 241 lb 1.6 oz (109.4 kg)  01/25/18 243 lb 3.2 oz (110.3 kg)  12/31/17 247 lb 4.8 oz (112.2 kg)   BMI Readings from Last 3 Encounters:  02/02/18 42.04 kg/m  01/25/18 43.08 kg/m  12/31/17 43.12 kg/m    Hep C Screening: We will check today STD testing and prevention (HIV/chl/gon/syphilis): We will check today Intimate partner violence: She is safe, no concerns Sexual History/Pain during Intercourse: No concerns Menstrual History/LMP/Abnormal Bleeding: LMP - 01/06/2018 Incontinence Symptoms: No concerns  Advanced Care Planning: A voluntary discussion about advance care planning including the explanation and discussion of advance directives.  Discussed health care proxy and Living  will, and the patient was able to identify a health care proxy as Mom - Sarah Burke.  Patient does not have a living will at present time. If patient does have living will, I have requested they bring this to the clinic to be scanned in to their chart.  Breast cancer: No personal history No results found for: HMMAMMO  BRCA gene screening: Maternal Grandmother, Maternal Aunt x2 only - no genetic testing was done, she notes that they were all older when they were diagnosed. Cervical cancer screening: Due for Pap today  Osteoporosis Screening: No family history No results found for: HMDEXASCAN  Lipids:  Lab Results  Component Value Date   CHOL 182 12/31/2017   Lab Results  Component Value Date   HDL 48 (L) 12/31/2017   Lab Results  Component Value Date   LDLCALC 116 (H) 12/31/2017   Lab Results  Component Value Date   TRIG 84 12/31/2017   Lab Results  Component Value Date   CHOLHDL 3.8 12/31/2017   No results found for: LDLDIRECT  Glucose:  Glucose, Bld  Date Value Ref Range Status  12/31/2017 84 65 - 99 mg/dL Final    Comment:    .            Fasting reference interval .     Skin cancer: No concerning lesions Colorectal cancer: Denies family or personal history of colorectal cancer,  no changes in BM's - no blood in stool, dark and tarry stool, mucus in stool, or constipation/diarrhea.   Lung cancer:  Never smoker; Low Dose CT Chest recommended if Age 45-80 years, 30 pack-year currently smoking OR have quit w/in 15years. Patient does not qualify.   ECG: Not on file.   Patient Active Problem List   Diagnosis Date Noted  . Metabolic syndrome 48/54/6270  . Prediabetes 01/04/2018  . Hyperlipidemia, mixed 01/04/2018  . Class 3 severe obesity due to excess calories with serious comorbidity and body mass index (BMI) of 40.0 to 44.9 in adult ([place]) 12/31/2017  . Vaginal pain 12/31/2017  . Urinary frequency 12/31/2017  . Situational mixed anxiety and depressive disorder  07/29/2017  . 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 NO PAST SURGERIES      Family History  Problem Relation Age of Onset  . Hypertension Mother   . Diabetes Brother   . Hypertension Maternal Grandmother   . Hypercholesterolemia Maternal Grandmother     Social History   Socioeconomic History  . Marital status: Single    Spouse name: Not on file  . Number of children: 1  . Years of education: Not on file  . Highest education level: Associate degree: occupational, Hotel manager, or vocational program  Occupational History  . Not on file  Social Needs  . Financial resource strain: Not hard at all  . Food insecurity:    Worry: Never true    Inability: Never true  . Transportation needs:    Medical: No    Non-medical: No  Tobacco Use  . Smoking status: Never Smoker  . Smokeless tobacco: Never Used  Substance and Sexual Activity  . Alcohol use: Yes    Comment: occassionally  . Drug use: Never  . Sexual activity: Yes    Partners: Male    Birth control/protection: None  Lifestyle  . Physical activity:    Days per week: 0 days    Minutes per session: 0 min  . Stress: Not at all  Relationships  . Social connections:    Talks on phone: More than three times a week    Gets together: More than three times a week    Attends religious service: 1 to 4 times per year    Active member of club or organization: No    Attends meetings of clubs or organizations: Never    Relationship status: Never married  . Intimate partner violence:    Fear of current or ex partner: No    Emotionally abused: No    Physically abused: No    Forced sexual activity: No  Other Topics Concern  . Not on file  Social History Narrative  . Not on file     Current Outpatient Medications:  .  acetaminophen (TYLENOL) 500 MG tablet, Take by mouth., Disp: , Rfl:  .  Semaglutide,0.25 or 0.5MG/DOS, (OZEMPIC, 0.25 OR 0.5 MG/DOSE,) 2 MG/1.5ML SOPN,  Inject 0.25 mg into the skin once a week for 30 days, THEN 0.5 mg once a week. (Patient not taking: Reported on 02/02/2018), Disp: 3 pen, Rfl: 1  No Known Allergies   ROS  Constitutional: Negative for fever or weight change.  Respiratory: Negative for cough and shortness of breath.   Cardiovascular: Negative for chest pain or palpitations.  Gastrointestinal: Negative for abdominal pain, no bowel changes.  Musculoskeletal: Negative for gait problem or joint swelling.  Skin: Negative for rash.  Neurological:  Negative for dizziness or headache.  No other specific complaints in a complete review of systems (except as listed in HPI above).  Objective  Vitals:   02/02/18 0817  BP: 134/86  Pulse: (!) 105  Resp: 16  Temp: 97.7 F (36.5 C)  TempSrc: Oral  SpO2: 99%  Weight: 241 lb 1.6 oz (109.4 kg)  Height: 5' 3.5" (1.613 m)    Body mass index is 42.04 kg/m.  Physical Exam  Constitutional: Patient appears well-developed and well-nourished. No distress.  HENT: Head: Normocephalic and atraumatic. Ears: B TMs ok, no erythema or effusion; Nose: Nose normal. Mouth/Throat: Oropharynx is clear and moist. No oropharyngeal exudate.  Eyes: Conjunctivae and EOM are normal. Pupils are equal, round, and reactive to light. No scleral icterus.  Neck: Normal range of motion. Neck supple. No JVD present. No thyromegaly present.  Cardiovascular: Normal rate, regular rhythm and normal heart sounds.  No murmur heard. No BLE edema. Pulmonary/Chest: Effort normal and breath sounds normal. No respiratory distress. Abdominal: Soft. Bowel sounds are normal, no distension. There is no tenderness. no masses Breast: no lumps or masses, no nipple discharge or rashes FEMALE GENITALIA:  External genitalia normal External urethra normal Vaginal vault normal without discharge or lesions Cervix normal without discharge or lesions Bimanual exam normal without masses RECTAL: no rectal masses or  hemorrhoids Musculoskeletal: Normal range of motion, no joint effusions. No gross deformities Neurological: he is alert and oriented to person, place, and time. No cranial nerve deficit. Coordination, balance, strength, speech and gait are normal.  Skin: Skin is warm and dry. No rash noted. No erythema.  Psychiatric: Patient has a normal mood and affect. behavior is normal. Judgment and thought content normal.  Recent Results (from the past 2160 hour(s))  Cervicovaginal ancillary only     Status: Abnormal   Collection Time: 12/31/17 12:00 AM  Result Value Ref Range   Bacterial vaginitis Negative for Bacterial Vaginitis Microorganisms     Comment: Normal Reference Range - Negative   Candida vaginitis **POSITIVE for Candida species** (A)     Comment: Normal Reference Range - Negative   Chlamydia Negative     Comment: Normal Reference Range - Negative   Neisseria gonorrhea Negative     Comment: Normal Reference Range - Negative   Trichomonas Negative     Comment: Normal Reference Range - Negative  Lipid panel     Status: Abnormal   Collection Time: 12/31/17  9:25 AM  Result Value Ref Range   Cholesterol 182 <200 mg/dL   HDL 48 (L) >50 mg/dL   Triglycerides 84 <150 mg/dL   LDL Cholesterol (Calc) 116 (H) mg/dL (calc)    Comment: Reference range: <100 . Desirable range <100 mg/dL for primary prevention;   <70 mg/dL for patients with CHD or diabetic patients  with > or = 2 CHD risk factors. Marland Kitchen LDL-C is now calculated using the Martin-Hopkins  calculation, which is a validated novel method providing  better accuracy than the Friedewald equation in the  estimation of LDL-C.  Cresenciano Genre et al. Annamaria Helling. 0865;784(69): 2061-2068  (http://education.[facility].com/faq/FAQ164)    Total CHOL/HDL Ratio 3.8 <5.0 (calc)   Non-HDL Cholesterol (Calc) 134 (H) <130 mg/dL (calc)    Comment: For patients with diabetes plus 1 major ASCVD risk  factor, treating to a non-HDL-C goal of <100 mg/dL   (LDL-C of <70 mg/dL) is considered a therapeutic  option.   Insulin, random     Status: None   Collection Time: 12/31/17  9:25  AM  Result Value Ref Range   Insulin 15.6 2.0 - 19.6 uIU/mL    Comment: This insulin assay shows strong cross-reactivity for some insulin analogs (lispro, aspart, and glargine) and much lower cross-reactivity with others (detemir, glulisine).   COMPLETE METABOLIC PANEL WITH GFR     Status: None   Collection Time: 12/31/17  9:25 AM  Result Value Ref Range   Glucose, Bld 84 65 - 99 mg/dL    Comment: .            Fasting reference interval .    BUN 9 7 - 25 mg/dL   Creat 0.84 0.50 - 1.10 mg/dL   GFR, Est Non African American 89 > OR = 60 mL/min/1.7m   GFR, Est African American 104 > OR = 60 mL/min/1.715m  BUN/Creatinine Ratio NOT APPLICABLE 6 - 22 (calc)   Sodium 137 135 - 146 mmol/L   Potassium 4.3 3.5 - 5.3 mmol/L   Chloride 104 98 - 110 mmol/L   CO2 25 20 - 32 mmol/L   Calcium 9.3 8.6 - 10.2 mg/dL   Total Protein 7.7 6.1 - 8.1 g/dL   Albumin 4.5 3.6 - 5.1 g/dL   Globulin 3.2 1.9 - 3.7 g/dL (calc)   AG Ratio 1.4 1.0 - 2.5 (calc)   Total Bilirubin 0.4 0.2 - 1.2 mg/dL   Alkaline phosphatase (APISO) 62 33 - 115 U/L   AST 12 10 - 30 U/L   ALT 9 6 - 29 U/L  CBC w/Diff/Platelet     Status: None   Collection Time: 12/31/17  9:25 AM  Result Value Ref Range   WBC 5.7 3.8 - 10.8 Thousand/uL   RBC 4.58 3.80 - 5.10 Million/uL   Hemoglobin 12.5 11.7 - 15.5 g/dL   HCT 38.1 35.0 - 45.0 %   MCV 83.2 80.0 - 100.0 fL   MCH 27.3 27.0 - 33.0 pg   MCHC 32.8 32.0 - 36.0 g/dL   RDW 12.5 11.0 - 15.0 %   Platelets 339 140 - 400 Thousand/uL   MPV 10.9 7.5 - 12.5 fL   Neutro Abs 3,574 1,500 - 7,800 cells/uL   Lymphs Abs 1,568 850 - 3,900 cells/uL   WBC mixed population 462 200 - 950 cells/uL   Eosinophils Absolute 68 15 - 500 cells/uL   Basophils Absolute 29 0 - 200 cells/uL   Neutrophils Relative % 62.7 %   Total Lymphocyte 27.5 %   Monocytes Relative 8.1  %   Eosinophils Relative 1.2 %   Basophils Relative 0.5 %  TSH     Status: None   Collection Time: 12/31/17  9:25 AM  Result Value Ref Range   TSH 2.10 mIU/L    Comment:           Reference Range .           > or = 20 Years  0.40-4.50 .                Pregnancy Ranges           First trimester    0.26-2.66           Second trimester   0.55-2.73           Third trimester    0.43-2.91   Hemoglobin A1c     Status: Abnormal   Collection Time: 12/31/17  9:25 AM  Result Value Ref Range   Hgb A1c MFr Bld 5.8 (H) <5.7 % of total Hgb  Comment: For someone without known diabetes, a hemoglobin  A1c value between 5.7% and 6.4% is consistent with prediabetes and should be confirmed with a  follow-up test. . For someone with known diabetes, a value <7% indicates that their diabetes is well controlled. A1c targets should be individualized based on duration of diabetes, age, comorbid conditions, and other considerations. . This assay result is consistent with an increased risk of diabetes. . Currently, no consensus exists regarding use of hemoglobin A1c for diagnosis of diabetes for children. .    Mean Plasma Glucose 120 (calc)   eAG (mmol/L) 6.6 (calc)  Urine Culture     Status: None   Collection Time: 12/31/17  9:25 AM  Result Value Ref Range   MICRO NUMBER: [id number]    SPECIMEN QUALITY: Adequate    Sample Source URINE    STATUS: FINAL    Result: No Growth    PHQ2/9: Depression screen [facility] 2/9 02/02/2018 01/25/2018 12/31/2017  Decreased Interest 0 0 0  Down, Depressed, Hopeless 0 0 0  PHQ - 2 Score 0 0 0  Altered sleeping 0 - 1  Tired, decreased energy 0 - 0  Change in appetite 0 - 0  Feeling bad or failure about yourself  0 - 0  Trouble concentrating 0 - 0  Moving slowly or fidgety/restless 0 - 0  Suicidal thoughts 0 - 0  PHQ-9 Score 0 - 1  Difficult doing work/chores Not difficult at all - Not difficult at all   Fall Risk: Fall Risk  02/02/2018 02/02/2018  01/25/2018 12/31/2017  Falls in the past year? 0 0 0 0  Number falls in past yr: 0 0 - -  Injury with Fall? - 0 - 0   Functional Status Survey: Is the patient deaf or have difficulty hearing?: No Does the patient have difficulty seeing, even when wearing glasses/contacts?: No Does the patient have difficulty concentrating, remembering, or making decisions?: No Does the patient have difficulty walking or climbing stairs?: No Does the patient have difficulty dressing or bathing?: No Does the patient have difficulty doing errands alone such as visiting a doctor's office or shopping?: No  Assessment & Plan   1 . Well woman exam -USPSTF grade A and B recommendations reviewed with patient; age-appropriate recommendations, preventive care, screening tests, etc discussed and encouraged; healthy living encouraged; see AVS for patient education given to patient -Discussed importance of 150 minutes of physical activity weekly, eat two servings of fish weekly, eat one serving of tree nuts ( cashews, pistachios, pecans, almonds.Marland Kitchen) every other day, eat 6 servings of fruit/vegetables daily and drink plenty of water and avoid sweet beverages.  - HIV Antibody (routine testing w rflx) - Hepatitis C antibody - RPR - MM 3D SCREEN BREAST BILATERAL; Future - Cytology - PAP  2. Routine screening for STI (sexually transmitted infection) - HIV Antibody (routine testing w rflx) - RPR - Cytology - PAP  3. Need for hepatitis C screening test - Hepatitis C antibody  4. Breast cancer screening by mammogram - MM 3D SCREEN BREAST BILATERAL; Future  5. Class 3 severe obesity due to excess calories with serious comorbidity and body mass index (BMI) of 40.0 to 44.9 in adult [facility]) - Continue with nutritionist and exercises at home.   6. Metabolic syndrome - metFORMIN (GLUCOPHAGE XR) 500 MG 24 hr tablet; Take 1 tablet (500 mg total) by mouth daily with breakfast.  Dispense: 90 tablet; Refill: 0 - Advised due to  history of PCOS - may become  pregnant as this medication can increase fertility.  She verbalizes understanding - declines OCP or referral to GYN for IUD.  7. Prediabetes - metFORMIN (GLUCOPHAGE XR) 500 MG 24 hr tablet; Take 1 tablet (500 mg total) by mouth daily with breakfast.  Dispense: 90 tablet; Refill: 0

## 2018-02-02 NOTE — Addendum Note (Signed)
Addended by: Doren CustardBOYCE, Pelagia Iacobucci E on: 02/02/2018 09:48 AM   Modules accepted: Orders

## 2018-02-02 NOTE — Patient Instructions (Signed)
Preventive Care 18-39 Years, Female Preventive care refers to lifestyle choices and visits with your health care provider that can promote health and wellness. What does preventive care include?   A yearly physical exam. This is also called an annual well check.  Dental exams once or twice a year.  Routine eye exams. Ask your health care provider how often you should have your eyes checked.  Personal lifestyle choices, including: ? Daily care of your teeth and gums. ? Regular physical activity. ? Eating a healthy diet. ? Avoiding tobacco and drug use. ? Limiting alcohol use. ? Practicing safe sex. ? Taking vitamin and mineral supplements as recommended by your health care provider. What happens during an annual well check? The services and screenings done by your health care provider during your annual well check will depend on your age, overall health, lifestyle risk factors, and family history of disease. Counseling Your health care provider may ask you questions about your:  Alcohol use.  Tobacco use.  Drug use.  Emotional well-being.  Home and relationship well-being.  Sexual activity.  Eating habits.  Work and work environment.  Method of birth control.  Menstrual cycle.  Pregnancy history. Screening You may have the following tests or measurements:  Height, weight, and BMI.  Diabetes screening. This is done by checking your blood sugar (glucose) after you have not eaten for a while (fasting).  Blood pressure.  Lipid and cholesterol levels. These may be checked every 5 years starting at age 20.  Skin check.  Hepatitis C blood test.  Hepatitis B blood test.  Sexually transmitted disease (STD) testing.  BRCA-related cancer screening. This may be done if you have a family history of breast, ovarian, tubal, or peritoneal cancers.  Pelvic exam and Pap test. This may be done every 3 years starting at age 21. Starting at age 30, this may be done every 5  years if you have a Pap test in combination with an HPV test. Discuss your test results, treatment options, and if necessary, the need for more tests with your health care provider. Vaccines Your health care provider may recommend certain vaccines, such as:  Influenza vaccine. This is recommended every year.  Tetanus, diphtheria, and acellular pertussis (Tdap, Td) vaccine. You may need a Td booster every 10 years.  Varicella vaccine. You may need this if you have not been vaccinated.  HPV vaccine. If you are 26 or younger, you may need three doses over 6 months.  Measles, mumps, and rubella (MMR) vaccine. You may need at least one dose of MMR. You may also need a second dose.  Pneumococcal 13-valent conjugate (PCV13) vaccine. You may need this if you have certain conditions and were not previously vaccinated.  Pneumococcal polysaccharide (PPSV23) vaccine. You may need one or two doses if you smoke cigarettes or if you have certain conditions.  Meningococcal vaccine. One dose is recommended if you are age 19-21 years and a first-year college student living in a residence hall, or if you have one of several medical conditions. You may also need additional booster doses.  Hepatitis A vaccine. You may need this if you have certain conditions or if you travel or work in places where you may be exposed to hepatitis A.  Hepatitis B vaccine. You may need this if you have certain conditions or if you travel or work in places where you may be exposed to hepatitis B.  Haemophilus influenzae type b (Hib) vaccine. You may need this if you   have certain risk factors. Talk to your health care provider about which screenings and vaccines you need and how often you need them. This information is not intended to replace advice given to you by your health care provider. Make sure you discuss any questions you have with your health care provider. Document Released: 03/25/2001 Document Revised: 09/09/2016  Document Reviewed: 11/28/2014 Elsevier Interactive Patient Education  2019 [employer].

## 2018-02-04 LAB — RPR: RPR: NONREACTIVE

## 2018-02-04 LAB — HIV ANTIBODY (ROUTINE TESTING W REFLEX): HIV 1&2 Ab, 4th Generation: NONREACTIVE

## 2018-02-05 LAB — CYTOLOGY - PAP
Adequacy: ABSENT
DIAGNOSIS: NEGATIVE
HPV: NOT DETECTED

## 2018-02-16 ENCOUNTER — Encounter: Payer: Self-pay | Admitting: Family Medicine

## 2018-03-01 ENCOUNTER — Institutional Professional Consult (permissible substitution): Payer: Medicaid Other | Admitting: Internal Medicine

## 2018-03-03 ENCOUNTER — Encounter: Payer: Self-pay | Admitting: Internal Medicine

## 2018-03-04 ENCOUNTER — Institutional Professional Consult (permissible substitution): Payer: Medicaid Other | Admitting: Internal Medicine

## 2018-03-08 ENCOUNTER — Ambulatory Visit: Payer: Medicaid Other | Admitting: Dietician

## 2018-03-11 ENCOUNTER — Institutional Professional Consult (permissible substitution): Payer: Medicaid Other | Admitting: Internal Medicine

## 2018-03-12 ENCOUNTER — Telehealth: Payer: Self-pay | Admitting: Dietician

## 2018-03-12 NOTE — Telephone Encounter (Signed)
Called patient to check on rescheduling her cancelled appointment from 03/08/18. Rescheduled for 03/15/18 at 5:30pm.

## 2018-03-15 ENCOUNTER — Encounter: Payer: Self-pay | Admitting: Dietician

## 2018-03-15 ENCOUNTER — Encounter: Payer: Medicaid Other | Attending: Family Medicine | Admitting: Dietician

## 2018-03-15 VITALS — Ht 63.0 in | Wt 238.5 lb

## 2018-03-15 DIAGNOSIS — E669 Obesity, unspecified: Secondary | ICD-10-CM | POA: Insufficient documentation

## 2018-03-15 DIAGNOSIS — R7303 Prediabetes: Secondary | ICD-10-CM | POA: Diagnosis not present

## 2018-03-15 DIAGNOSIS — E282 Polycystic ovarian syndrome: Secondary | ICD-10-CM | POA: Insufficient documentation

## 2018-03-15 DIAGNOSIS — E785 Hyperlipidemia, unspecified: Secondary | ICD-10-CM | POA: Diagnosis not present

## 2018-03-15 DIAGNOSIS — Z6841 Body Mass Index (BMI) 40.0 and over, adult: Secondary | ICD-10-CM | POA: Insufficient documentation

## 2018-03-15 DIAGNOSIS — Z713 Dietary counseling and surveillance: Secondary | ICD-10-CM | POA: Insufficient documentation

## 2018-03-15 DIAGNOSIS — I10 Essential (primary) hypertension: Secondary | ICD-10-CM | POA: Insufficient documentation

## 2018-03-15 NOTE — Patient Instructions (Signed)
   Continue to find a variety of healthy food/ meal options, great job so far!  Keep building up your regular exercise to add more time and/or days. Take advantage of spring-like weather and longer daylight for opportunities to walk or do other outdoor activity.

## 2018-03-15 NOTE — Progress Notes (Signed)
Medical Nutrition Therapy: Visit start time: 1730  end time: 1730  Assessment:  Diagnosis: pre-diabetes, obesity Medical history changes: no changes since 12/16 per patient Psychosocial issues/ stress concerns: none  Current weight: 238.5lbs Height: 5'3" Medications, supplement changes: reconciled list in medical record  Progress and evaluation: Weight loss of 4.4lbs since previous visit on 01/25/18. Patient has worked on controlling portions of carbohydrate foods, and has increased exercise slightly. She voices some disappointment that she has not lost more weight.   Physical activity: cardio/ walking 30-60 minutes, 2-3 times a week  Dietary Intake:  Usual eating pattern includes 3 meals and 1 snacks per day. Dining out frequency: not assessed today.  Breakfast: Cheerios, Health visitor sandwich, Premier protein shake or bar Snack: none Lunch: healthy choice meal or [place] sandwich Snack: grapes or apple; veggie chips Supper: whole grain rice or pasta with grilled/ baked meat + veggies Snack: usually none Beverages: water, sometimes with Crystal Light mix  Nutrition Care Education: Topics covered: weight control diabetes prevention Weight control: Reviewed patient's progress since previous visit; determining reasonable weight loss rate, non-food factors that can affect weight loss, including PCOS and pre-diabetes (elevated insulin); reviewed role of exercise and options for increasing Diabetes prevention:  appropriate carb portions and healthy carb choices; balanced meal options for increasing variety of intake.    Nutritional Diagnosis:  [place]-2.2 Altered nutrition-related laboratory As related to pre-diabetes.  As evidenced by patient with recent HbA1C of 5.8%. [place]-3.3 Overweight/obesity As related to history of excess calories, limited activity, PCOS.  As evidenced by patient with current BMI of 42.8, and patient report of medical, dietary, and activity history.  Intervention:    Discussion and instruction as noted above.  Commended patient for progress made.   Updated goals with input from patient.   Patient deferred scheduling another follow-up until she sees PCP next month.   Education Materials given:  . Recipes . Goals/ instructions  Learner/ who was taught:  . Patient   Level of understanding: Marland Kitchen Verbalizes/ demonstrates competency   Demonstrated degree of understanding via:   Teach back Learning barriers: . None  Willingness to learn/ readiness for change: . Eager, change in progress  Monitoring and Evaluation:  Dietary intake, exercise, BG control, and body weight      follow up: prn

## 2018-04-19 ENCOUNTER — Other Ambulatory Visit: Payer: Self-pay | Admitting: Family Medicine

## 2018-04-19 DIAGNOSIS — R7303 Prediabetes: Secondary | ICD-10-CM

## 2018-04-19 DIAGNOSIS — Z6841 Body Mass Index (BMI) 40.0 and over, adult: Principal | ICD-10-CM

## 2018-04-19 DIAGNOSIS — E8881 Metabolic syndrome: Secondary | ICD-10-CM

## 2018-05-25 ENCOUNTER — Encounter: Payer: Self-pay | Admitting: Family Medicine

## 2018-06-02 ENCOUNTER — Telehealth: Payer: Medicaid Other | Admitting: Family

## 2018-06-02 DIAGNOSIS — N760 Acute vaginitis: Secondary | ICD-10-CM | POA: Diagnosis not present

## 2018-06-02 DIAGNOSIS — B9689 Other specified bacterial agents as the cause of diseases classified elsewhere: Secondary | ICD-10-CM | POA: Diagnosis not present

## 2018-06-02 MED ORDER — METRONIDAZOLE 500 MG PO TABS: 500.0000 mg | ORAL_TABLET | Freq: Two times a day (BID) | ORAL | 0 refills | Status: DC

## 2018-06-02 NOTE — Progress Notes (Signed)

## 2018-06-04 ENCOUNTER — Other Ambulatory Visit: Payer: Self-pay

## 2018-06-04 ENCOUNTER — Ambulatory Visit: Payer: Medicaid Other | Admitting: Family Medicine

## 2018-06-28 ENCOUNTER — Encounter: Payer: Self-pay | Admitting: Family Medicine

## 2018-06-28 ENCOUNTER — Ambulatory Visit (INDEPENDENT_AMBULATORY_CARE_PROVIDER_SITE_OTHER): Payer: Medicaid Other | Admitting: Family Medicine

## 2018-06-28 DIAGNOSIS — F4323 Adjustment disorder with mixed anxiety and depressed mood: Secondary | ICD-10-CM

## 2018-06-28 DIAGNOSIS — E8881 Metabolic syndrome: Secondary | ICD-10-CM | POA: Diagnosis not present

## 2018-06-28 DIAGNOSIS — B373 Candidiasis of vulva and vagina: Secondary | ICD-10-CM

## 2018-06-28 DIAGNOSIS — Z6841 Body Mass Index (BMI) 40.0 and over, adult: Secondary | ICD-10-CM

## 2018-06-28 DIAGNOSIS — Z8669 Personal history of other diseases of the nervous system and sense organs: Secondary | ICD-10-CM

## 2018-06-28 DIAGNOSIS — R7303 Prediabetes: Secondary | ICD-10-CM | POA: Diagnosis not present

## 2018-06-28 DIAGNOSIS — B3731 Acute candidiasis of vulva and vagina: Secondary | ICD-10-CM

## 2018-06-28 DIAGNOSIS — E782 Mixed hyperlipidemia: Secondary | ICD-10-CM

## 2018-06-28 MED ORDER — FLUCONAZOLE 150 MG PO TABS: 150.0000 mg | ORAL_TABLET | Freq: Once | ORAL | 0 refills | Status: AC

## 2018-06-28 MED ORDER — HYDROXYZINE PAMOATE 25 MG PO CAPS: 25.0000 mg | ORAL_CAPSULE | Freq: Three times a day (TID) | ORAL | 0 refills | Status: DC | PRN

## 2018-06-28 NOTE — Progress Notes (Signed)
Name: Sarah Burke   MRN: [id number]    DOB: 1980/06/05   Date:06/28/2018       Progress Note  Subjective  Chief Complaint  Chief Complaint  Patient presents with  . Follow-up  . Vaginal Discharge    pt thinks still has BV    I connected with  Kassie Mends  on 06/28/18 at  7:40 AM EDT by a video enabled telemedicine application and verified that I am speaking with the correct person using two identifiers.  I discussed the limitations of evaluation and management by telemedicine and the availability of in person appointments. The patient expressed understanding and agreed to proceed. Staff also discussed with the patient that there may be a patient responsible charge related to this service. Patient Location: Home Provider Location: Home Additional Individuals present: None  HPI   Social: She just started a new job with [employer], she is relocating to [place] for this job, but is going back and forth between [employer] and  every 2 weeks.   Recurrent BV/Candidiasis: She notes 1 new partner in the last year.  She was recently treated on 06/02/2018 via Evisit for BV. Completed flagyl, but still having a little vaginal irritation.  She notes odor has much improved, but she is still having some vaginal itching. She is not having any discharge.  We will send in diflucan, if still not improving, will come in for swab. No abdominal pain, fevers, or chills  Obesity/Prediabetes/Metabolic Syndrome: She was diagnosed with PCOS in [place].  Has prediabetes.  She is taking the metformin, but has not seen a major change in her weight.  She is tolerating metformin well.  She is not exercising consistently right now due to Gyms being closed from COVID-19 pandemic.   History Bell's Palsey: She still has some tension on the right side eye (always has some tension on the right side of her face) - has been seeing neurology - is on a 1 year follow up at this point.  Situational Anxiety and  Depression: When she developed Bells Palsy last year, she started to reduce her stress, cut a lot of people out of her life. She says she has lost a lot of her desire to go out and socialize like she used to, still contacting family multiple times a week.  Her new job is stressful, but she is trying to cope.  She does want something PRN for her anxiety - we will provide hydroxyzine PRN for anxiety.  Declines medication today, declines counseling today. PHQ-9 score of 1 today.  Denies SI/HI.     Office Visit from 06/28/2018 in [facility]  PHQ-9 Total Score  0     Patient Active Problem List   Diagnosis Date Noted  . Metabolic syndrome 01/04/2018  . Prediabetes 01/04/2018  . Hyperlipidemia, mixed 01/04/2018  . Class 3 severe obesity due to excess calories with serious comorbidity and body mass index (BMI) of 40.0 to 44.9 in adult (HCC) 12/31/2017  . Vaginal pain 12/31/2017  . Urinary frequency 12/31/2017  . Situational mixed anxiety and depressive disorder 07/29/2017  . 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 NO PAST SURGERIES      Family History  Problem Relation Age of Onset  . Hypertension Mother   . Diabetes Brother   . Hypertension Maternal Grandmother   . Hypercholesterolemia Maternal Grandmother     Social History   Socioeconomic History  .  Marital status: Single    Spouse name: Not on file  . Number of children: 1  . Years of education: Not on file  . Highest education level: Associate degree: occupational, Scientist, product/process development, or vocational program  Occupational History  . Not on file  Social Needs  . Financial resource strain: Not hard at all  . Food insecurity:    Worry: Never true    Inability: Never true  . Transportation needs:    Medical: No    Non-medical: No  Tobacco Use  . Smoking status: Never Smoker  . Smokeless tobacco: Never Used  Substance and Sexual Activity  . Alcohol use: Yes     Comment: occassionally  . Drug use: Never  . Sexual activity: Yes    Partners: Male    Birth control/protection: None  Lifestyle  . Physical activity:    Days per week: 0 days    Minutes per session: 0 min  . Stress: Not at all  Relationships  . Social connections:    Talks on phone: More than three times a week    Gets together: More than three times a week    Attends religious service: 1 to 4 times per year    Active member of club or organization: No    Attends meetings of clubs or organizations: Never    Relationship status: Never married  . Intimate partner violence:    Fear of current or ex partner: No    Emotionally abused: No    Physically abused: No    Forced sexual activity: No  Other Topics Concern  . Not on file  Social History Narrative   37yo daugher Belva Crome      Current Outpatient Medications:  .  acetaminophen (TYLENOL) 500 MG tablet, Take by mouth., Disp: , Rfl:  .  metFORMIN (GLUCOPHAGE-XR) 500 MG 24 hr tablet, TAKE 1 TABLET(500 MG) BY MOUTH DAILY WITH BREAKFAST, Disp: 90 tablet, Rfl: 0 .  metroNIDAZOLE (FLAGYL) 500 MG tablet, Take 1 tablet (500 mg total) by mouth 2 (two) times daily. (Patient not taking: Reported on 06/28/2018), Disp: 14 tablet, Rfl: 0  No Known Allergies  I personally reviewed active problem list, medication list, allergies, lab results with the patient/caregiver today.   ROS Constitutional: Negative for fever or weight change.  Respiratory: Negative for cough and shortness of breath.   Cardiovascular: Negative for chest pain or palpitations.  Gastrointestinal: Negative for abdominal pain, no bowel changes.  Musculoskeletal: Negative for gait problem or joint swelling.  Skin: Negative for rash.  Neurological: Negative for dizziness or headache.  No other specific complaints in a complete review of systems (except as listed in HPI above).  Objective  Virtual encounter, vitals not obtained.  There is no height or weight on  file to calculate BMI.  Physical Exam  Constitutional: Patient appears well-developed and well-nourished. No distress.  HENT: Head: Normocephalic and atraumatic.  Neck: Normal range of motion. Pulmonary/Chest: Effort normal. No respiratory distress. Speaking in complete sentences Neurological: Pt is alert and oriented to person, place, and time. Coordination, speech and gait are normal.  Psychiatric: Patient has a normal mood and affect. behavior is normal. Judgment and thought content normal.  No results found for this or any previous visit (from the past 72 hour(s)).  PHQ2/9: Depression screen [facility] 2/9 06/28/2018 02/02/2018 01/25/2018 12/31/2017  Decreased Interest 0 0 0 0  Down, Depressed, Hopeless 0 0 0 0  PHQ - 2 Score 0 0 0 0  Altered sleeping  0 0 - 1  Tired, decreased energy 0 0 - 0  Change in appetite 0 0 - 0  Feeling bad or failure about yourself  0 0 - 0  Trouble concentrating 0 0 - 0  Moving slowly or fidgety/restless 0 0 - 0  Suicidal thoughts 0 0 - 0  PHQ-9 Score 0 0 - 1  Difficult doing work/chores Not difficult at all Not difficult at all - Not difficult at all   PHQ-2/9 Result is negative.    Fall Risk: Fall Risk  06/28/2018 03/15/2018 02/02/2018 02/02/2018 01/25/2018  Falls in the past year? 0 0 0 0 0  Number falls in past yr: 0 - 0 0 -  Injury with Fall? 0 - - 0 -    Assessment & Plan  1. Candida vaginitis - If not improving after 72 hours of diflucan, will have her come in for swab.  Consider GYN for recurrent BV infections. - fluconazole (DIFLUCAN) 150 MG tablet; Take 1 tablet (150 mg total) by mouth once for 1 dose.  Dispense: 1 tablet; Refill: 0  2. Class 3 severe obesity due to excess calories with serious comorbidity and body mass index (BMI) of 40.0 to 44.9 in adult [facility](HCC) - Discussed importance of 150 minutes of physical activity weekly, eat two servings of fish weekly, eat one serving of tree nuts ( cashews, pistachios, pecans, almonds.Marland Kitchen.) every other  day, eat 6 servings of fruit/vegetables daily and drink plenty of water and avoid sweet beverages.  - Hemoglobin A1c - COMPLETE METABOLIC PANEL WITH GFR  3. Prediabetes - discussed diet and exercise as above, continue Metformin - Hemoglobin A1c - COMPLETE METABOLIC PANEL WITH GFR  4. Metabolic syndrome - Hemoglobin A1c - Lipid panel - COMPLETE METABOLIC PANEL WITH GFR  5. Hyperlipidemia, mixed - Lipid panel  6. History of Bell's palsy - Seeing neurology annually, [place] - encouraged her to find new PCP and request referral to neurology if needed.  7. Situational mixed anxiety and depressive disorder - Stress reduction techniques encouraged. - hydrOXYzine (VISTARIL) 25 MG capsule; Take 1 capsule (25 mg total) by mouth every 8 (eight) hours as needed for anxiety.  Dispense: 30 capsule; Refill: 0  I discussed the assessment and treatment plan with the patient. The patient was provided an opportunity to ask questions and all were answered. The patient agreed with the plan and demonstrated an understanding of the instructions.  The patient was advised to call back or seek an in-person evaluation if the symptoms worsen or if the condition fails to improve as anticipated.  I provided 26 minutes of non-face-to-face time during this encounter.

## 2018-07-01 ENCOUNTER — Ambulatory Visit: Payer: Medicaid Other | Admitting: Family Medicine

## 2018-08-31 ENCOUNTER — Encounter: Payer: Self-pay | Admitting: Family Medicine

## 2018-08-31 ENCOUNTER — Telehealth: Payer: Medicaid Other | Admitting: Family

## 2018-08-31 DIAGNOSIS — N898 Other specified noninflammatory disorders of vagina: Secondary | ICD-10-CM

## 2018-08-31 NOTE — Progress Notes (Signed)
Based on what you shared with me, I feel your condition warrants further evaluation and I recommend that you be seen for a face to face office visit.  We need to check the discharge since you are also having belly pain and a new partner. Please schedule an appointment with your primary care provider.    NOTE: If you entered your credit card information for this eVisit, you will not be charged. You may see a "hold" on your card for the $35 but that hold will drop off and you will not have a charge processed.  If you are having a true medical emergency please call 911.     For an urgent face to face visit, [facility] has five urgent care centers for your convenience:    [email or web address] to reserve your spot online an avoid wait times  [facility] (New Address!) [address], [place], [place] [address] *Just off [address], across the road from [place] hours of operation: Monday-Friday, 12 PM to 6 PM  Closed Saturday & Sunday   The following sites will take your insurance:  . [facility]    [phone]                  Get Driving Directions  [address], [place] [address] . 10 am to 8 pm Monday-Friday . 12 pm to 8 pm Saturday-Sunday   . [facility] at [place]                  Get Driving Directions  [address], [place], [place] [address] . 8 am to 8 pm Monday-Friday . 9 am to 6 pm Saturday . 11 am to 6 pm Sunday   . [facility] at [place]                  Get Driving Directions   [address] [place], [place] [address] . 8 am to 8 pm Monday-Friday . 8 am to 4 pm Saturday-Sunday    . [facility] at [place]                    Get Driving Directions  [phone]  [address], [place], [place] [address]  . Monday-Friday, 12 PM to 6 PM     Your e-visit answers were reviewed by a board certified advanced clinical practitioner to complete your personal care plan.  Thank you for using e-Visits.

## 2018-09-01 ENCOUNTER — Ambulatory Visit (INDEPENDENT_AMBULATORY_CARE_PROVIDER_SITE_OTHER): Payer: Medicaid Other | Admitting: Family Medicine

## 2018-09-01 ENCOUNTER — Encounter: Payer: Self-pay | Admitting: Family Medicine

## 2018-09-01 ENCOUNTER — Other Ambulatory Visit: Payer: Self-pay

## 2018-09-01 DIAGNOSIS — B373 Candidiasis of vulva and vagina: Secondary | ICD-10-CM | POA: Diagnosis not present

## 2018-09-01 DIAGNOSIS — N3000 Acute cystitis without hematuria: Secondary | ICD-10-CM

## 2018-09-01 DIAGNOSIS — B3731 Acute candidiasis of vulva and vagina: Secondary | ICD-10-CM

## 2018-09-01 MED ORDER — FLUCONAZOLE 150 MG PO TABS: ORAL_TABLET | ORAL | 0 refills | Status: DC

## 2018-09-01 MED ORDER — SULFAMETHOXAZOLE-TRIMETHOPRIM 800-160 MG PO TABS: 1.0000 | ORAL_TABLET | Freq: Two times a day (BID) | ORAL | 0 refills | Status: AC

## 2018-09-01 NOTE — Progress Notes (Signed)
Name: Sarah Burke   MRN: [id number]    DOB: April 27, 1980   Date:09/01/2018       Progress Note  Subjective  Chief Complaint  Chief Complaint  Patient presents with  . Vaginitis    recurrent BV    I connected with  Mellody Life  on 09/01/18 at  9:00 AM EDT by a video enabled telemedicine application and verified that I am speaking with the correct person using two identifiers.  I discussed the limitations of evaluation and management by telemedicine and the availability of in person appointments. The patient expressed understanding and agreed to proceed. Staff also discussed with the patient that there may be a patient responsible charge related to this service. Patient Location: Home Provider Location: Home office Additional Individuals present: None  HPI  RecurrentBV/Candidiasis/?UTI: Most recent episode started about a week ago.  She noticed thin white discharge with some pieces of white "chunks" in it, then had urine foul odor and dark in color. Denies dysuria, but is having some lower abdominal discomfort. No fevers/chills, no back/flank pain, no hematuria, no history of kidney stones.  Lengthy discussion regarding recurrent candida and BV.  She has tried several types of condoms with her partner, all of which cause irritation; recommend she use latex-free and low spermicide to reduce irritation.    Patient Active Problem List   Diagnosis Date Noted  . Metabolic syndrome 28/31/5176  . Prediabetes 01/04/2018  . Hyperlipidemia, mixed 01/04/2018  . Class 3 severe obesity due to excess calories with serious comorbidity and body mass index (BMI) of 40.0 to 44.9 in adult ([place]) 12/31/2017  . Vaginal pain 12/31/2017  . Urinary frequency 12/31/2017  . Situational mixed anxiety and depressive disorder 07/29/2017  . History of Bell's palsy 10/06/2016    Social History   Tobacco Use  . Smoking status: Never Smoker  . Smokeless tobacco: Never Used  Substance Use Topics  .  Alcohol use: Yes    Comment: occassionally     Current Outpatient Medications:  .  hydrOXYzine (VISTARIL) 25 MG capsule, Take 1 capsule (25 mg total) by mouth every 8 (eight) hours as needed for anxiety., Disp: 30 capsule, Rfl: 0 .  metFORMIN (GLUCOPHAGE-XR) 500 MG 24 hr tablet, TAKE 1 TABLET(500 MG) BY MOUTH DAILY WITH BREAKFAST, Disp: 90 tablet, Rfl: 0 .  acetaminophen (TYLENOL) 500 MG tablet, Take by mouth., Disp: , Rfl:   No Known Allergies  I personally reviewed active problem list, medication list, allergies, notes from last encounter, lab results with the patient/caregiver today.  ROS  Ten systems reviewed and is negative except as mentioned in HPI  Objective  Virtual encounter, vitals not obtained.  There is no height or weight on file to calculate BMI.  Nursing Note and Vital Signs reviewed.  Physical Exam  Constitutional: Patient appears well-developed and well-nourished. No distress.  HENT: Head: Normocephalic and atraumatic.  Neck: Normal range of motion. Pulmonary/Chest: Effort normal. No respiratory distress. Speaking in complete sentences Neurological: Pt is alert and oriented to person, place, and time. Coordination, speech are normal.  Psychiatric: Patient has a normal mood and affect. behavior is normal. Judgment and thought content normal. Abdominal: Self palpation - states minimal tenderness in BLQ, otherwise no concerns.  No results found for this or any previous visit (from the past 72 hour(s)).  Assessment & Plan  1. Candida vaginitis - fluconazole (DIFLUCAN) 150 MG tablet; Take 1 tablet once.  If not improving in 72 hours, repeat dose.  Dispense: 2 tablet; Refill: 0  2. Acute cystitis without hematuria - sulfamethoxazole-trimethoprim (BACTRIM DS) 800-160 MG tablet; Take 1 tablet by mouth 2 (two) times daily for 3 days.  Dispense: 6 tablet; Refill: 0   Advised she start taking daily probiotic.  Also recommend her female partner see a provider as  there have been reports of high sugar content in ejaculate causing recurrent vaginal candidiasis.  I recommend she try latex free condoms for about a month to avoid contact with ejaculate to see if this reduces her recurrent infection.  Symptoms are consistent with UTI and candida today, if not improving by Friday (3 days), she will message me and we will send in BV treatment.  -Red flags and when to present for emergency care or RTC including fever >101.45F, chest pain, shortness of breath, new/worsening/un-resolving symptoms, reviewed with patient at time of visit. Follow up and care instructions discussed and provided in AVS. - I discussed the assessment and treatment plan with the patient. The patient was provided an opportunity to ask questions and all were answered. The patient agreed with the plan and demonstrated an understanding of the instructions.  I provided 16 minutes of non-face-to-face time during this encounter.  Doren CustardEmily E [facility], FNP

## 2018-09-01 NOTE — Patient Instructions (Addendum)
Start taking a probiotic daily.

## 2018-10-29 ENCOUNTER — Encounter: Payer: Self-pay | Admitting: Family Medicine

## 2018-10-30 ENCOUNTER — Encounter: Payer: Self-pay | Admitting: Family Medicine

## 2018-11-02 ENCOUNTER — Encounter: Payer: Self-pay | Admitting: Family Medicine

## 2018-11-02 DIAGNOSIS — E782 Mixed hyperlipidemia: Secondary | ICD-10-CM

## 2018-11-02 DIAGNOSIS — E8881 Metabolic syndrome: Secondary | ICD-10-CM

## 2018-11-02 DIAGNOSIS — Z6841 Body Mass Index (BMI) 40.0 and over, adult: Secondary | ICD-10-CM

## 2018-11-02 DIAGNOSIS — R7303 Prediabetes: Secondary | ICD-10-CM

## 2018-11-08 ENCOUNTER — Telehealth: Payer: Self-pay

## 2018-11-08 NOTE — Telephone Encounter (Signed)
Copied from [place] [phone]. Topic: Referral - Status >> Nov 05, 2018  9:56 AM Lennox Solders wrote: Reason for CRM: [facility] does not participating with medicaid only insurance patient for bariatric surgery however caren beasley does [facility] surgery weight loss phone number [phone]   Referral has been changed as requested.

## 2018-12-13 ENCOUNTER — Encounter: Payer: Self-pay | Admitting: Family Medicine

## 2018-12-13 ENCOUNTER — Ambulatory Visit: Payer: Medicaid Other | Admitting: Family Medicine

## 2018-12-13 ENCOUNTER — Other Ambulatory Visit: Payer: Self-pay

## 2018-12-13 ENCOUNTER — Other Ambulatory Visit (HOSPITAL_COMMUNITY)
Admission: RE | Admit: 2018-12-13 | Discharge: 2018-12-13 | Disposition: A | Discharge status: Home / Self Care | Payer: Medicaid Other | Source: Ambulatory Visit | Attending: Family Medicine | Admitting: Family Medicine

## 2018-12-13 VITALS — BP 126/82 | HR 96 | Temp 97.5°F | Resp 16 | Ht 63.0 in | Wt 258.6 lb

## 2018-12-13 DIAGNOSIS — B9689 Other specified bacterial agents as the cause of diseases classified elsewhere: Secondary | ICD-10-CM

## 2018-12-13 DIAGNOSIS — G510 Bell's palsy: Secondary | ICD-10-CM

## 2018-12-13 DIAGNOSIS — N898 Other specified noninflammatory disorders of vagina: Secondary | ICD-10-CM | POA: Insufficient documentation

## 2018-12-13 DIAGNOSIS — N760 Acute vaginitis: Secondary | ICD-10-CM

## 2018-12-13 DIAGNOSIS — F4323 Adjustment disorder with mixed anxiety and depressed mood: Secondary | ICD-10-CM

## 2018-12-13 DIAGNOSIS — R7303 Prediabetes: Secondary | ICD-10-CM | POA: Diagnosis not present

## 2018-12-13 DIAGNOSIS — Z6841 Body Mass Index (BMI) 40.0 and over, adult: Secondary | ICD-10-CM

## 2018-12-13 DIAGNOSIS — E782 Mixed hyperlipidemia: Secondary | ICD-10-CM | POA: Diagnosis not present

## 2018-12-13 DIAGNOSIS — Z113 Encounter for screening for infections with a predominantly sexual mode of transmission: Secondary | ICD-10-CM

## 2018-12-13 DIAGNOSIS — E8881 Metabolic syndrome: Secondary | ICD-10-CM

## 2018-12-13 MED ORDER — PREDNISONE 10 MG PO TABS: ORAL_TABLET | ORAL | 0 refills | Status: DC

## 2018-12-13 MED ORDER — BUPROPION HCL ER (XL) 150 MG PO TB24: 150.0000 mg | ORAL_TABLET | Freq: Every day | ORAL | 1 refills | Status: DC

## 2018-12-13 NOTE — Patient Instructions (Signed)
DO NOT START WELLBUTRIN Until you finish your Prednisone

## 2018-12-13 NOTE — Progress Notes (Signed)
Name: Sarah Burke   MRN: [id number]    DOB: 03/13/1980   Date:12/13/2018       Progress Note  Subjective  Chief Complaint  Chief Complaint  Patient presents with  . Follow-up    HPI  RecurrentBV/Candidiasis: She notes 1 partner in the last year - states she did find out he was cheating, so we will check STI screen today as well. She notes odor and some mild white thin discharge.  Denies abdominal pain, vaginal pain, vaginal itching. She is not having any discharge.  Obesity/Prediabetes/Metabolic Syndrome: She was diagnosed with PCOS when living in [place].  Has prediabetes.  She is tolerating metformin well, however she is up 20lbs since last visit and is hoping to lose weight with help of medication.  She is not exercising consistently right now, but is walking more; Gyms are closed from COVID-19 pandemic. No known family history of thyroid cancer, no personal history of pancreatitis or thyroid cancer.  Denies polyuria, polydipsia, or polyphagia.  Will consider Victoza depending on labs. We will also start Wellbutrin.  History Bell's Palsey: She still has some tension on the right side eye (always has some tension on the right side of her face) - has been seeing neurology - is on a 1 year follow up at this point.  Over the last 2 months she's had some increase in tension and twitching of the right eye.  She is due for eye appointment and neuro follow up.  Will provide 5 day prednisone taper today for her mild symptoms.   Situational Anxiety and Depression: When she developed Bells Palsy in 2018, she started to reduce her stress, cut a lot of people out of her life. She says she has lost a lot of her desire to go out and socialize like she used to, still contacting family multiple times a week.  She takes hydroxyzine PRN for anxiety - does work when she needs it.  Doing hair out of her home right now.  Would like to try Wellbutrin for motivation and for weight loss - discussed  possible increase in anxiety - she verbalizes understanding and will stop medication immediately if this occurs.  Denies SI/HI.    Patient Active Problem List   Diagnosis Date Noted  . Metabolic syndrome 34/74/2595  . Prediabetes 01/04/2018  . Hyperlipidemia, mixed 01/04/2018  . Class 3 severe obesity due to excess calories with serious comorbidity and body mass index (BMI) of 40.0 to 44.9 in adult ([facility]) 12/31/2017  . Vaginal pain 12/31/2017  . Urinary frequency 12/31/2017  . Situational mixed anxiety and depressive disorder 07/29/2017  . History of Bell's palsy 10/06/2016    Past Surgical History:  Procedure Laterality Date  . NO PAST SURGERIES      Family History  Problem Relation Age of Onset  . Hypertension Mother   . Diabetes Brother   . Hypertension Maternal Grandmother   . Hypercholesterolemia Maternal Grandmother     Social History   Socioeconomic History  . Marital status: Single    Spouse name: Not on file  . Number of children: 1  . Years of education: Not on file  . Highest education level: Associate degree: occupational, Hotel manager, or vocational program  Occupational History  . Not on file  Social Needs  . Financial resource strain: Not hard at all  . Food insecurity    Worry: Never true    Inability: Never true  . Transportation needs    Medical: No  Non-medical: No  Tobacco Use  . Smoking status: Never Smoker  . Smokeless tobacco: Never Used  Substance and Sexual Activity  . Alcohol use: Yes    Comment: occassionally  . Drug use: Never  . Sexual activity: Yes    Partners: Male    Birth control/protection: None  Lifestyle  . Physical activity    Days per week: 0 days    Minutes per session: 0 min  . Stress: Not at all  Relationships  . Social connections    Talks on phone: More than three times a week    Gets together: More than three times a week    Attends religious service: 1 to 4 times per year    Active member of club or  organization: No    Attends meetings of clubs or organizations: Never    Relationship status: Never married  . Intimate partner violence    Fear of current or ex partner: No    Emotionally abused: No    Physically abused: No    Forced sexual activity: No  Other Topics Concern  . Not on file  Social History Narrative   37yo daugher Belva Crome      Current Outpatient Medications:  .  acetaminophen (TYLENOL) 500 MG tablet, Take by mouth., Disp: , Rfl:  .  hydrOXYzine (VISTARIL) 25 MG capsule, Take 1 capsule (25 mg total) by mouth every 8 (eight) hours as needed for anxiety., Disp: 30 capsule, Rfl: 0 .  metFORMIN (GLUCOPHAGE-XR) 500 MG 24 hr tablet, TAKE 1 TABLET(500 MG) BY MOUTH DAILY WITH BREAKFAST, Disp: 90 tablet, Rfl: 0 .  fluconazole (DIFLUCAN) 150 MG tablet, Take 1 tablet once.  If not improving in 72 hours, repeat dose. (Patient not taking: Reported on 12/13/2018), Disp: 2 tablet, Rfl: 0  No Known Allergies  I personally reviewed active problem list, medication list, allergies, notes from last encounter, lab results with the patient/caregiver today.   ROS  Ten systems reviewed and is negative except as mentioned in HPI  Objective  Vitals:   12/13/18 0803  BP: 126/82  Pulse: 96  Resp: 16  Temp: (!) 97.5 F (36.4 C)  TempSrc: Temporal  SpO2: 97%  Weight: 258 lb 9.6 oz (117.3 kg)  Height: 5\' 3"  (1.6 m)    Body mass index is 45.81 kg/m.  Physical Exam  Constitutional: Patient appears well-developed and well-nourished. No distress.  HENT: Head: Normocephalic and atraumatic. Ears: bilateral TMs with no erythema or effusion; Nose: Nose normal. Mouth/Throat: Oropharynx is clear and moist. No oropharyngeal exudate or tonsillar swelling.  Eyes: Conjunctivae and EOM are normal. No scleral icterus.  Pupils are equal, round, and reactive to light.  Neck: Normal range of motion. Neck supple. No JVD present. No thyromegaly present.  Cardiovascular: Normal rate, regular rhythm  and normal heart sounds.  No murmur heard. No BLE edema. Pulmonary/Chest: Effort normal and breath sounds normal. No respiratory distress. Abdominal: Soft. Bowel sounds are normal, no distension. There is no tenderness. No masses. No CVA tenderness. Musculoskeletal: Normal range of motion, no joint effusions. No gross deformities Neurological: Pt is alert and oriented to person, place, and time. Coordination, balance, strength, speech and gait are normal.  There is a slight droop to the right eye - slightly worse than her typical baseline; no other facial droop, weakness, or abnormalities, no other cranial nerve deficit Skin: Skin is warm and dry. No rash noted. No erythema.  Psychiatric: Patient has a normal mood and affect. behavior is  normal. Judgment and thought content normal.  No results found for this or any previous visit (from the past 72 hour(s)).  PHQ2/9: [facility] 12/13/2018 09/01/2018 06/28/2018 02/02/2018 01/25/2018  Decreased Interest 0 0 0 0 0  Down, Depressed, Hopeless 0 0 0 0 0  PHQ - 2 Score 0 0 0 0 0  Altered sleeping 0 0 0 0 -  Tired, decreased energy 0 0 0 0 -  Change in appetite 0 0 0 0 -  Feeling bad or failure about yourself  0 0 0 0 -  Trouble concentrating 0 0 0 0 -  Moving slowly or fidgety/restless 0 0 0 0 -  Suicidal thoughts 0 0 0 0 -  PHQ-9 Score 0 0 0 0 -  Difficult doing work/chores Not difficult at all Not difficult at all Not difficult at all Not difficult at all -   PHQ-2/9 Result is negative.    Fall Risk: Fall Risk  12/13/2018 09/01/2018 06/28/2018 03/15/2018 02/02/2018  Falls in the past year? 0 0 0 0 0  Number falls in past yr: 0 0 0 - 0  Injury with Fall? 0 0 0 - -  Follow up Falls evaluation completed Falls evaluation completed - - -   Assessment & Plan  1. Metabolic syndrome - Hemoglobin A1c - COMPLETE METABOLIC PANEL WITH GFR - Lipid panel  2. Prediabetes - Hemoglobin A1c - COMPLETE METABOLIC PANEL WITH GFR - Lipid panel   3. Class 3 severe obesity due to excess calories with serious comorbidity and body mass index (BMI) of 40.0 to 44.9 in adult [facility](HCC) - Wellbutrin to try to help with energy, cravings, and weight loss - buPROPion (WELLBUTRIN XL) 150 MG 24 hr tablet; Take 1 tablet (150 mg total) by mouth daily.  Dispense: 90 tablet; Refill: 1  4. Hyperlipidemia, mixed - Lipid panel  5. Bell's palsy - Prednisone per orders - 5 day taper for mild flare; stroke symptoms discussed in detail with her.  NEeds to call to schedule follow up with neurology.  6. Situational mixed anxiety and depressive disorder - Discussed STOPPING wellbutrin if anxiety heightens.  Do not start until after prednisone taper is completed. - buPROPion (WELLBUTRIN XL) 150 MG 24 hr tablet; Take 1 tablet (150 mg total) by mouth daily.  Dispense: 90 tablet; Refill: 1  7. BV (bacterial vaginosis) - Cervicovaginal ancillary only  8. Routine screening for STI (sexually transmitted infection) - Urine Culture - Cervicovaginal ancillary only - HIV Antibody (routine testing w rflx) - RPR  9. Vaginal odor - Urine Culture - Cervicovaginal ancillary only

## 2018-12-14 ENCOUNTER — Encounter: Payer: Self-pay | Admitting: Family Medicine

## 2018-12-14 LAB — CERVICOVAGINAL ANCILLARY ONLY
Bacterial Vaginitis (gardnerella): NEGATIVE
Candida Glabrata: NEGATIVE
Candida Vaginitis: NEGATIVE
Chlamydia: NEGATIVE
Comment: NEGATIVE
Comment: NEGATIVE
Comment: NEGATIVE
Comment: NEGATIVE
Comment: NEGATIVE
Comment: NORMAL
Neisseria Gonorrhea: NEGATIVE
Trichomonas: NEGATIVE

## 2018-12-15 LAB — COMPLETE METABOLIC PANEL WITH GFR
AG Ratio: 1.4 (calc) (ref 1.0–2.5)
ALT: 15 U/L (ref 6–29)
AST: 12 U/L (ref 10–30)
Albumin: 4.1 g/dL (ref 3.6–5.1)
Alkaline phosphatase (APISO): 64 U/L (ref 31–125)
BUN/Creatinine Ratio: 7 (calc) (ref 6–22)
BUN: 6 mg/dL — ABNORMAL LOW (ref 7–25)
CO2: 24 mmol/L (ref 20–32)
Calcium: 9.5 mg/dL (ref 8.6–10.2)
Chloride: 106 mmol/L (ref 98–110)
Creat: 0.87 mg/dL (ref 0.50–1.10)
GFR, Est African American: 99 mL/min/{1.73_m2} (ref >=60)
GFR, Est Non African American: 85 mL/min/{1.73_m2} (ref >=60)
Globulin: 2.9 g/dL (calc) (ref 1.9–3.7)
Glucose, Bld: 106 mg/dL — ABNORMAL HIGH (ref 65–99)
Potassium: 4.4 mmol/L (ref 3.5–5.3)
Sodium: 139 mmol/L (ref 135–146)
Total Bilirubin: 0.2 mg/dL (ref 0.2–1.2)
Total Protein: 7 g/dL (ref 6.1–8.1)

## 2018-12-15 LAB — HEMOGLOBIN A1C
Hgb A1c MFr Bld: 5.9 % of total Hgb — ABNORMAL HIGH (ref <5.7)
Mean Plasma Glucose: 123 (calc)
eAG (mmol/L): 6.8 (calc)

## 2018-12-15 LAB — HIV ANTIBODY (ROUTINE TESTING W REFLEX): HIV 1&2 Ab, 4th Generation: NONREACTIVE

## 2018-12-15 LAB — LIPID PANEL
Cholesterol: 156 mg/dL (ref <200)
HDL: 56 mg/dL (ref >=50)
LDL Cholesterol (Calc): 84 mg/dL (calc)
Non-HDL Cholesterol (Calc): 100 mg/dL (calc) (ref <130)
Total CHOL/HDL Ratio: 2.8 (calc) (ref <5.0)
Triglycerides: 71 mg/dL (ref <150)

## 2018-12-15 LAB — URINE CULTURE
MICRO NUMBER:: 1055752
SPECIMEN QUALITY:: ADEQUATE

## 2018-12-15 LAB — RPR: RPR Ser Ql: NONREACTIVE

## 2019-03-10 ENCOUNTER — Ambulatory Visit (INDEPENDENT_AMBULATORY_CARE_PROVIDER_SITE_OTHER): Payer: Medicaid Other | Admitting: Family Medicine

## 2019-03-10 ENCOUNTER — Other Ambulatory Visit: Payer: Self-pay

## 2019-03-10 ENCOUNTER — Encounter (INDEPENDENT_AMBULATORY_CARE_PROVIDER_SITE_OTHER): Payer: Self-pay | Admitting: Family Medicine

## 2019-03-10 VITALS — BP 121/76 | HR 81 | Temp 98.6°F | Ht 62.0 in | Wt 254.0 lb

## 2019-03-10 DIAGNOSIS — Z6841 Body Mass Index (BMI) 40.0 and over, adult: Secondary | ICD-10-CM

## 2019-03-10 DIAGNOSIS — R0602 Shortness of breath: Secondary | ICD-10-CM | POA: Diagnosis not present

## 2019-03-10 DIAGNOSIS — R7303 Prediabetes: Secondary | ICD-10-CM

## 2019-03-10 DIAGNOSIS — F3289 Other specified depressive episodes: Secondary | ICD-10-CM

## 2019-03-10 DIAGNOSIS — E282 Polycystic ovarian syndrome: Secondary | ICD-10-CM | POA: Diagnosis not present

## 2019-03-10 DIAGNOSIS — E7849 Other hyperlipidemia: Secondary | ICD-10-CM | POA: Diagnosis not present

## 2019-03-10 DIAGNOSIS — R5383 Other fatigue: Secondary | ICD-10-CM

## 2019-03-10 DIAGNOSIS — Z0289 Encounter for other administrative examinations: Secondary | ICD-10-CM

## 2019-03-10 NOTE — Progress Notes (Signed)
Dear Sarah Small, NP,   Thank you for referring Sarah Burke to our clinic. The following note includes my evaluation and treatment recommendations.  Chief Complaint:   OBESITY Sarah Burke (MR# [id number]) is a 39 y.o. female who presents for evaluation and treatment of obesity and related comorbidities. Current BMI is Body mass index is 46.46 kg/m. Sarah Burke has been struggling with her weight for many years and has been unsuccessful in either losing weight, maintaining weight loss, or reaching her healthy weight goal.  Sarah Burke is currently in the action stage of change and ready to dedicate time achieving and maintaining a healthier weight. Sarah Burke is interested in becoming our patient and working on intensive lifestyle modifications including (but not limited to) diet and exercise for weight loss.  Sarah Burke's habits were reviewed today and are as follows: Her family sometimes eats meals together, she thinks her family will possibly eat healthier with her, her desired weight loss is 82 pounds, she started gaining weight when she was diagnosed with PCOS in 2014, her heaviest weight ever was 260 pounds, she craves carbs like bread, pasta, and rice, she snacks in the evenings at times, she is frequently drinking liquids with calories, she sometimes makes poor food choices and she sometimes eats larger portions than normal.  Depression Screen Sarah Burke's Food and Mood (modified PHQ-9) score was 8.  Depression screen [facility] 2/9 03/10/2019  Decreased Interest 2  Down, Depressed, Hopeless 0  PHQ - 2 Score 2  Altered sleeping 2  Tired, decreased energy 3  Change in appetite 0  Feeling bad or failure about yourself  0  Trouble concentrating 0  Moving slowly or fidgety/restless 1  Suicidal thoughts 0  PHQ-9 Score 8  Difficult doing work/chores Somewhat difficult   Subjective:   1. Other fatigue Sarah Burke admits to daytime somnolence and reports waking up still tired. Patent has a history of symptoms of  daytime fatigue, morning fatigue and morning headache. Sarah Burke generally gets 4-8 hours of sleep per night, and states that she has poor sleep quality. Snoring is present. Apneic episodes are not present. Epworth Sleepiness Score is 7.  2. Shortness of breath on exertion Sarah Burke notes increasing shortness of breath with exercising and seems to be worsening over time with weight gain. She notes getting out of breath sooner with activity than she used to. This has gotten worse recently. Sarah Burke denies shortness of breath at rest or orthopnea.  3. PCOS (polycystic ovarian syndrome) Sarah Burke has the diagnosis of PCOS.  4. Prediabetes Sarah Burke has a diagnosis of prediabetes based on her elevated HgA1c and was informed this puts her at greater risk of developing diabetes. She continues to work on diet and exercise to decrease her risk of diabetes. She denies nausea or hypoglycemia.  Lab Results  Component Value Date   HGBA1C 5.9 (H) 12/13/2018   5. Other hyperlipidemia Sarah Burke has hyperlipidemia and has been trying to improve her cholesterol levels with intensive lifestyle modification including a low saturated fat diet, exercise and weight loss. She denies any chest pain, claudication or myalgias.    Lab Results  Component Value Date   ALT 15 12/13/2018   AST 12 12/13/2018   BILITOT 0.2 12/13/2018   Lab Results  Component Value Date   CHOL 156 12/13/2018   HDL 56 12/13/2018   LDLCALC 84 12/13/2018   TRIG 71 12/13/2018   CHOLHDL 2.8 12/13/2018   6. Other depression, with emotional eating Sarah Burke is struggling with emotional eating  and using food for comfort to the extent that it is negatively impacting her health. She has been working on behavior modification techniques to help reduce her emotional eating and has been unsuccessful. She shows no sign of suicidal or homicidal ideations.  She is taking Wellbutrin 150 mg daily.  PHQ-9 is 8.  Assessment/Plan:   1. Other fatigue Sarah Burke does feel that her weight is  causing her energy to be lower than it should be. Fatigue may be related to obesity, depression or many other causes. Labs will be ordered, and in the meanwhile, Sarah Burke will focus on self care including making healthy food choices, increasing physical activity and focusing on stress reduction.  Orders - EKG 12-Lead - Ambulatory referral to Neurology - Anemia panel - Vitamin B12 - CBC with Differential/Platelet - T3 - T4, free - TSH - VITAMIN D 25 Hydroxy (Vit-D Deficiency, Fractures)  2. Shortness of breath on exertion Sarah Burke does feel that she gets out of breath more easily that she used to when she exercises. Sarah Burke shortness of breath appears to be obesity related and exercise induced. She has agreed to work on weight loss and gradually increase exercise to treat her exercise induced shortness of breath. Will continue to monitor closely. - Lipid Panel With LDL/HDL Ratio  3. PCOS (polycystic ovarian syndrome) Intensive lifestyle modifications are first line treatment for this issue. We discussed several lifestyle modifications today and she will continue to work on diet, exercise and weight loss efforts. Orders and follow up as documented in patient record.  Counseling . PCOS is a leading cause of menstrual irregularities and infertility. It is also associated with obesity, hirsutism (excessive hair growth on the face, chest, or back), and cardiovascular risk factors such as high cholesterol and insulin resistance. . Insulin resistance appears to play a central role.  . Women with PCOS have been shown to have impaired appetite-regulating hormones. . Metformin is one medication that can improve metabolic parameters.  . Women with polycystic ovary syndrome (PCOS) have an increased risk for cardiovascular disease (CVD) - European Journal of Preventive Cardiology. - Lipid Panel With LDL/HDL Ratio  4. Prediabetes Sarah Burke will continue to work on weight loss, exercise, and decreasing simple  carbohydrates to help decrease the risk of diabetes.   Orders - Comprehensive metabolic panel - Hemoglobin A1c - Insulin, random  5. Other hyperlipidemia Cardiovascular risk and specific lipid/LDL goals reviewed.  We discussed several lifestyle modifications today and Sarah Burke will continue to work on diet, exercise and weight loss efforts. Orders and follow up as documented in patient record.   Counseling Intensive lifestyle modifications are the first line treatment for this issue. . Dietary changes: Increase soluble fiber. Decrease simple carbohydrates. . Exercise changes: Moderate to vigorous-intensity aerobic activity 150 minutes per week if tolerated. . Lipid-lowering medications: see documented in medical record. - Lipid Panel With LDL/HDL Ratio  6. Other depression, with emotional eating Behavior modification techniques were discussed today to help Sarah Burke deal with her emotional/non-hunger eating behaviors.  Orders and follow up as documented in patient record.   7. Class 3 severe obesity with serious comorbidity and body mass index (BMI) of 45.0 to 49.9 in adult, unspecified obesity type (HCC) Sarah Burke is currently in the action stage of change and her goal is to continue with weight loss efforts. I recommend Sarah Burke begin the structured treatment plan as follows:  She has agreed to the Category 3 Plan.  Exercise goals: For substantial health benefits, adults should do at least 150  minutes (2 hours and 30 minutes) a week of moderate-intensity, or 75 minutes (1 hour and 15 minutes) a week of vigorous-intensity aerobic physical activity, or an equivalent combination of moderate- and vigorous-intensity aerobic activity. Aerobic activity should be performed in episodes of at least 10 minutes, and preferably, it should be spread throughout the week. Adults should also include muscle-strengthening activities that involve all major muscle groups on 2 or more days a week.   Behavioral modification  strategies: increasing lean protein intake, decreasing simple carbohydrates, increasing vegetables and increasing water intake.  She was informed of the importance of frequent follow-up visits to maximize her success with intensive lifestyle modifications for her multiple health conditions. She was informed we would discuss her lab results at her next visit unless there is a critical issue that needs to be addressed sooner. Sarah Burke agreed to keep her next visit at the agreed upon time to discuss these results.  Objective:   Blood pressure 121/76, pulse 81, temperature 98.6 F (37 C), temperature source Oral, height 5\' 2"  (1.575 m), weight 254 lb (115.2 kg), last menstrual period 03/03/2019, SpO2 100 %. Body mass index is 46.46 kg/m.  EKG: Normal sinus rhythm, rate 76 bpm.  Indirect Calorimeter completed today shows a VO2 of 280 and a REE of 1947.  Her calculated basal metabolic rate is 03/05/2019 thus her basal metabolic rate is worse than expected.  General: Cooperative, alert, well developed, in no acute distress. HEENT: Conjunctivae and lids unremarkable. Cardiovascular: Regular rhythm.  Lungs: Normal work of breathing. Neurologic: No focal deficits.   Lab Results  Component Value Date   CREATININE 0.87 12/13/2018   BUN 6 (L) 12/13/2018   NA 139 12/13/2018   K 4.4 12/13/2018   CL 106 12/13/2018   CO2 24 12/13/2018   Lab Results  Component Value Date   ALT 15 12/13/2018   AST 12 12/13/2018   BILITOT 0.2 12/13/2018   Lab Results  Component Value Date   HGBA1C 5.9 (H) 12/13/2018   HGBA1C 5.8 (H) 12/31/2017   Lab Results  Component Value Date   TSH 2.10 12/31/2017   Lab Results  Component Value Date   CHOL 156 12/13/2018   HDL 56 12/13/2018   LDLCALC 84 12/13/2018   TRIG 71 12/13/2018   CHOLHDL 2.8 12/13/2018   Lab Results  Component Value Date   WBC 5.7 12/31/2017   HGB 12.5 12/31/2017   HCT 38.1 12/31/2017   MCV 83.2 12/31/2017   PLT 339 12/31/2017   Lab Results    Component Value Date   FERRITIN 10 06/23/2013   Attestation Statements:   This is the patient's first visit at [facility]. The patient's NEW PATIENT PACKET was reviewed at length. Included in the packet: current and past health history, medications, allergies, ROS, gynecologic history (women only), surgical history, family history, social history, weight history, weight loss surgery history (for those that have had weight loss surgery), nutritional evaluation, mood and food questionnaire, PHQ9, Epworth questionnaire, sleep habits questionnaire, patient life and health improvement goals questionnaire. These will all be scanned into the patient's chart under media.   During the visit, I independently reviewed the patient's EKG, bioimpedance scale results, and indirect calorimeter results. I used this information to tailor a meal plan for the patient that will help her to lose weight and will improve her obesity-related conditions going forward. I performed a medically necessary appropriate examination and/or evaluation. I discussed the assessment and treatment plan with the patient. The  patient was provided an opportunity to ask questions and all were answered. The patient agreed with the plan and demonstrated an understanding of the instructions. Labs were ordered at this visit and will be reviewed at the next visit unless more critical results need to be addressed immediately. Clinical information was updated and documented in the EMR.   Time spent on visit including pre-visit chart review and post-visit care was 60 minutes.   I, Insurance claims handler, CMA, am acting as Energy manager for [employer], DO.  I have reviewed the above documentation for accuracy and completeness, and I agree with the above. Helane Rima, DO

## 2019-03-11 LAB — COMPREHENSIVE METABOLIC PANEL
ALT: 15 IU/L (ref 0–32)
AST: 17 IU/L (ref 0–40)
Albumin/Globulin Ratio: 1.3 (ref 1.2–2.2)
Albumin: 4.2 g/dL (ref 3.8–4.8)
Alkaline Phosphatase: 87 IU/L (ref 39–117)
BUN/Creatinine Ratio: 10 (ref 9–23)
BUN: 8 mg/dL (ref 6–20)
Bilirubin Total: 0.3 mg/dL (ref 0.0–1.2)
CO2: 22 mmol/L (ref 20–29)
Calcium: 9.6 mg/dL (ref 8.7–10.2)
Chloride: 106 mmol/L (ref 96–106)
Creatinine, Ser: 0.84 mg/dL (ref 0.57–1.00)
GFR calc Af Amer: 103 mL/min/{1.73_m2} (ref >59)
GFR calc non Af Amer: 89 mL/min/{1.73_m2} (ref >59)
Globulin, Total: 3.3 g/dL (ref 1.5–4.5)
Glucose: 84 mg/dL (ref 65–99)
Potassium: 4.4 mmol/L (ref 3.5–5.2)
Sodium: 143 mmol/L (ref 134–144)
Total Protein: 7.5 g/dL (ref 6.0–8.5)

## 2019-03-11 LAB — HEMOGLOBIN A1C
Est. average glucose Bld gHb Est-mCnc: 128 mg/dL
Hgb A1c MFr Bld: 6.1 % — ABNORMAL HIGH (ref 4.8–5.6)

## 2019-03-11 LAB — CBC WITH DIFFERENTIAL/PLATELET
Basophils Absolute: 0 10*3/uL (ref 0.0–0.2)
Basos: 1 %
EOS (ABSOLUTE): 0.1 10*3/uL (ref 0.0–0.4)
Eos: 2 %
Hemoglobin: 12.1 g/dL (ref 11.1–15.9)
Immature Grans (Abs): 0 10*3/uL (ref 0.0–0.1)
Immature Granulocytes: 0 %
Lymphocytes Absolute: 1.6 10*3/uL (ref 0.7–3.1)
Lymphs: 28 %
MCH: 27.2 pg (ref 26.6–33.0)
MCHC: 32.3 g/dL (ref 31.5–35.7)
MCV: 84 fL (ref 79–97)
Monocytes Absolute: 0.4 10*3/uL (ref 0.1–0.9)
Monocytes: 7 %
Neutrophils Absolute: 3.7 10*3/uL (ref 1.4–7.0)
Neutrophils: 62 %
Platelets: 304 10*3/uL (ref 150–450)
RBC: 4.45 x10E6/uL (ref 3.77–5.28)
RDW: 12.7 % (ref 11.7–15.4)
WBC: 5.9 10*3/uL (ref 3.4–10.8)

## 2019-03-11 LAB — ANEMIA PANEL
Ferritin: 36 ng/mL (ref 15–150)
Folate, Hemolysate: 296 ng/mL
Folate, RBC: 789 ng/mL (ref >498)
Hematocrit: 37.5 % (ref 34.0–46.6)
Iron Saturation: 18 % (ref 15–55)
Iron: 61 ug/dL (ref 27–159)
Retic Ct Pct: 1.7 % (ref 0.6–2.6)
Total Iron Binding Capacity: 346 ug/dL (ref 250–450)
UIBC: 285 ug/dL (ref 131–425)
Vitamin B-12: 374 pg/mL (ref 232–1245)

## 2019-03-11 LAB — VITAMIN D 25 HYDROXY (VIT D DEFICIENCY, FRACTURES): Vit D, 25-Hydroxy: 7.3 ng/mL — ABNORMAL LOW (ref 30.0–100.0)

## 2019-03-11 LAB — T4, FREE: Free T4: 1.18 ng/dL (ref 0.82–1.77)

## 2019-03-11 LAB — LIPID PANEL WITH LDL/HDL RATIO
Cholesterol, Total: 190 mg/dL (ref 100–199)
HDL: 51 mg/dL (ref >39)
LDL Chol Calc (NIH): 126 mg/dL — ABNORMAL HIGH (ref 0–99)
LDL/HDL Ratio: 2.5 ratio (ref 0.0–3.2)
Triglycerides: 73 mg/dL (ref 0–149)
VLDL Cholesterol Cal: 13 mg/dL (ref 5–40)

## 2019-03-11 LAB — INSULIN, RANDOM: INSULIN: 18.5 u[IU]/mL (ref 2.6–24.9)

## 2019-03-11 LAB — TSH: TSH: 2.33 u[IU]/mL (ref 0.450–4.500)

## 2019-03-11 LAB — T3: T3, Total: 119 ng/dL (ref 71–180)

## 2019-03-24 ENCOUNTER — Ambulatory Visit (INDEPENDENT_AMBULATORY_CARE_PROVIDER_SITE_OTHER): Payer: Medicaid Other | Admitting: Family Medicine

## 2019-03-24 ENCOUNTER — Encounter (INDEPENDENT_AMBULATORY_CARE_PROVIDER_SITE_OTHER): Payer: Self-pay | Admitting: Family Medicine

## 2019-03-24 ENCOUNTER — Encounter: Payer: Self-pay | Admitting: Neurology

## 2019-03-24 ENCOUNTER — Ambulatory Visit: Payer: Medicaid Other | Admitting: Neurology

## 2019-03-24 ENCOUNTER — Other Ambulatory Visit: Payer: Self-pay

## 2019-03-24 VITALS — BP 125/81 | HR 69 | Temp 98.6°F | Ht 62.0 in | Wt 251.0 lb

## 2019-03-24 VITALS — BP 136/80 | HR 74 | Temp 97.7°F | Ht 62.0 in | Wt 253.0 lb

## 2019-03-24 DIAGNOSIS — Z6841 Body Mass Index (BMI) 40.0 and over, adult: Secondary | ICD-10-CM

## 2019-03-24 DIAGNOSIS — E7849 Other hyperlipidemia: Secondary | ICD-10-CM | POA: Diagnosis not present

## 2019-03-24 DIAGNOSIS — E282 Polycystic ovarian syndrome: Secondary | ICD-10-CM | POA: Diagnosis not present

## 2019-03-24 DIAGNOSIS — R7303 Prediabetes: Secondary | ICD-10-CM

## 2019-03-24 DIAGNOSIS — Z72821 Inadequate sleep hygiene: Secondary | ICD-10-CM

## 2019-03-24 DIAGNOSIS — F3289 Other specified depressive episodes: Secondary | ICD-10-CM | POA: Diagnosis not present

## 2019-03-24 DIAGNOSIS — E662 Morbid (severe) obesity with alveolar hypoventilation: Secondary | ICD-10-CM

## 2019-03-24 DIAGNOSIS — E8881 Metabolic syndrome: Secondary | ICD-10-CM | POA: Diagnosis not present

## 2019-03-24 DIAGNOSIS — E559 Vitamin D deficiency, unspecified: Secondary | ICD-10-CM

## 2019-03-24 DIAGNOSIS — Z8669 Personal history of other diseases of the nervous system and sense organs: Secondary | ICD-10-CM

## 2019-03-24 DIAGNOSIS — G44009 Cluster headache syndrome, unspecified, not intractable: Secondary | ICD-10-CM

## 2019-03-24 DIAGNOSIS — F5104 Psychophysiologic insomnia: Secondary | ICD-10-CM

## 2019-03-24 NOTE — Progress Notes (Signed)
SLEEP MEDICINE CLINIC    Provider:  Larey Seat, MD  Primary Care Physician:  Hubbard Hartshorn, [place] [id number]     Referring Provider: Briscoe Deutscher, DO        Chief Complaint according to patient   Patient presents with:    . New Patient (Initial Visit)           HISTORY OF PRESENT ILLNESS:  Sarah Burke is a 39 year- old African American female patient seen on 03/24/2019 , referred by healthy weight and wellness, Dr Briscoe Deutscher, DO. Chief concern according to patient :  " my primary referred me a while ago before I joined weight and wellness, I have a g hard time falling asleep, I wake up snoring and with headches" Sometimes I wake from headaches in the middle of the night. I go to the bathroom 3-4 times nightly.    I have the pleasure of seeing Sarah Burke today, a right -handed Black or [place] American female with a possible sleep disorder.  She  has a past medical history of Anxiety, Back pain, Chest pain, Constipation, Edema, lower extremity, GERD (gastroesophageal reflux disease), High cholesterol, History of PCOS, Hypertension, Infertility, female, Prediabetes, and Shortness of breath.. She has headaches in AM and headaches that are sharp and stabbing and wake her up out of sleep.   Sleep relevant medical history: Bell's palsy 2 years ago- right face, Nocturia, Sleep walking; yes in childhood, Sarah Burke affected too- mother used to- , Night terrors- in her Sarah Burke, no Tonsillectomy but wisdom teeth were removed, neck pain - she gets numb in both arms, mostly left - arising from the neck at night. No family history of OSA.      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  Tobacco use_ never - no passive exposure either .  ETOH use: seldomly,  Caffeine intake in form of Coffee( 2  a week ) Soda( none) Tea ( none) or energy drinks. Regular exercise: none - too fatigued.   Hobbies: none    Sleep habits are as follows: The patient's dinner time is between 6-8  PM. The patient goes to bed at 1-3 AM and continues to sleep for 2-3 hours, wakes for many bathroom breaks, the first time at 3 AM.   The preferred sleep position is side and prone, with the support of 1- pillow.  Dreams are reportedly present many night- not nightmarish.   7.30  AM is the usual rise time. The patient wakes up with an alarm at 7 AM. She reports not feeling refreshed or restored in AM, with symptoms such as dry mouth , morning headaches and residual fatigue. Naps are taken infrequently, she is busy all day- Sarah Burke  Is 39 years old, school starts at 7.45 virtual.   Review of Systems: Out of a complete 14 system review, the patient complains of only the following symptoms, and all other reviewed systems are negative.:  Fatigue, sleepiness , snoring, fragmented sleep, Insomnia - sleep deprived.     How likely are you to doze in the following situations: 0 = not likely, 1 = slight chance, 2 = moderate chance, 3 = high chance   Sitting and Reading? Watching Television? Sitting inactive in a public place (theater or meeting)? As  a passenger in a car for an hour without a break? Lying down in the afternoon when circumstances permit? Sitting and talking to someone? Sitting quietly after lunch without alcohol? In a car, while stopped for a few minutes in traffic?   Total = 14/ 24 points   FSS endorsed at 59/ 63 points.   Social History   Socioeconomic History  . Marital status: Single    Spouse name: Not on file  . Number of children: 1  . Years of education: Not on file  . Highest education level: Associate degree: occupational, Scientist, product/process development, or vocational program  Occupational History  . Occupation: homemaker  Tobacco Use  . Smoking status: Never Smoker  . Smokeless tobacco: Never Used    Substance and Sexual Activity  . Alcohol use: Yes    Comment: occassionally  . Drug use: Never  . Sexual activity: Yes    Partners: Male    Birth control/protection: None  Other Topics Concern  . Not on file  Social History Narrative   38yo daugher - Arts administrator    Social Determinants of Health   Financial Resource Strain:   . Difficulty of Paying Living Expenses: Not on file  Food Insecurity:   . Worried About Programme researcher, broadcasting/film/video in the Last Year: Not on file  . Ran Out of Food in the Last Year: Not on file  Transportation Needs:   . Lack of Transportation (Medical): Not on file  . Lack of Transportation (Non-Medical): Not on file  Physical Activity:   . Days of Exercise per Week: Not on file  . Minutes of Exercise per Session: Not on file  Stress:   . Feeling of Stress : Not on file  Social Connections:   . Frequency of Communication with Friends and Family: Not on file  . Frequency of Social Gatherings with Friends and Family: Not on file  . Attends Religious Services: Not on file  . Active Member of Clubs or Organizations: Not on file  . Attends Banker Meetings: Not on file  . Marital Status: Not on file    Family History  Problem Relation Age of Onset  . Hypertension Mother   . Obesity Mother   . Diabetes Brother   . Hypertension Father   . Hyperlipidemia Father   . Heart disease Father   . Hypertension Maternal Grandmother   . Hypercholesterolemia Maternal Grandmother     Past Medical History:  Diagnosis Date  . Anxiety   . Back pain   . Chest pain   . Constipation   . Edema, lower extremity   . GERD (gastroesophageal reflux disease)   . High cholesterol   . History of PCOS   . Hypertension   . Infertility, female   . Prediabetes   . Shortness of breath     Past Surgical History:  Procedure Laterality Date  . NO PAST SURGERIES       Current Outpatient Medications on File Prior to Visit  Medication Sig Dispense Refill  .  acetaminophen (TYLENOL) 500 MG tablet Take by mouth.    Marland Kitchen buPROPion (WELLBUTRIN XL) 150 MG 24 hr tablet Take 1 tablet (150 mg total) by mouth daily. 90 tablet 1   No current facility-administered medications on file prior to visit.    Physical exam:  Today's Vitals   03/24/19 0942  BP: 136/80  Pulse: 74  Temp: 97.7 F (36.5 C)  Weight: 253 lb (114.8 kg)  Height: 5\' 2"  (1.575 m)  Body mass index is 46.27 kg/m.   Wt Readings from Last 3 Encounters:  03/24/19 253 lb (114.8 kg)  03/10/19 254 lb (115.2 kg)  12/13/18 258 lb 9.6 oz (117.3 kg)     Ht Readings from Last 3 Encounters:  03/24/19 5\' 2"  (1.575 m)  03/10/19 5\' 2"  (1.575 m)  12/13/18 5\' 3"  (1.6 m)      General: The patient is awake, alert and appears not in acute distress. The patient is well groomed. Head: Normocephalic, atraumatic. Neck is supple. Mallampati: 3,  neck circumference:16. 5  inches . Nasal airflow  patent.  Retrognathia is seen.  Dental status: intact  Cardiovascular:  Regular rate and cardiac rhythm by pulse,  without distended neck veins. Respiratory: Lungs are clear to auscultation.  Skin:  tattooed. Without evidence of ankle edema, or rash. Trunk: The patient's posture is erect.   Neurologic exam : The patient is awake and alert, oriented to place and time.   Memory subjective described as intact.  Attention span & concentration ability appears normal.  Speech is fluent,  without  dysarthria, dysphonia or aphasia.  Mood and affect are appropriate.   Cranial nerves: no loss of smell or taste reported  Pupils are equal and briskly reactive to light. Funduscopic exam deferred.  Extraocular movements in vertical and horizontal planes were intact and without nystagmus. No Diplopia. Visual fields by finger perimetry are intact. Hearing was intact to soft voice and finger rubbing.  Facial sensation intact to fine touch. Facial motor strength is symmetric and tongue and uvula move midline.  Neck ROM  : rotation, tilt and flexion extension were normal for age and shoulder shrug was symmetrical.    Motor exam:  Symmetric bulk, tone and ROM.   Normal tone without cog wheeling, symmetric grip strength .  Sensory:  Fine touch, pinprick and vibration were normal.  Proprioception tested in the upper extremities was normal. Coordination: Rapid alternating movements in the fingers/hands were of normal speed.  The Finger-to-nose maneuver was intact without evidence of ataxia, dysmetria or tremor. Gait and station: Patient could rise unassisted from a seated position, walked without assistive device.  Toe and heel walk were deferred.  Deep tendon reflexes: in the  upper and lower extremities are symmetric and intact.  Babinski response was deferred.     Mrs. Mairena endorsed blurred vision, fatigue, snoring and shortness of breath, aching muscles, she is definitely not getting enough sleep and this may result in her decreased energy during the day.  There is insomnia at night and sleepiness during the day but as she keeps busy she is not allowing herself to fall asleep.  She has a past history of polycystic ovarian disorder, GERD, infertility, edema, Bell's palsy hypertension, diabetes, high cholesterol and some anxiety disorder.  Polycystic ovarian syndrome was diagnosed in 2015 so in 2014 and her heaviest weight was 260 pounds.  She has started to crave carbohydrates and she has had some trouble with eating larger portions.  Has reached a BMI of 46 which is considered morbidly obese class III.  It certainly has put her into a higher risk group of developing diabetes which is also related to polycystic ovarian syndrome, and she is definitely excessively daytime sleepy and fatigued her lab results show an HbA1c of 5.9 which is elevated long-term diabetic management.  Normal kidney function normal liver function tests.  She is taking Wellbutrin 150 mg in the morning for an elevated level of  depression.  She is referred  for an EKG, she did an anemia panel, vitamin B12 level CBC with differential thyroid panel and vitamin D panel, it is very likely that she has developed insulin resistance due to the polycystic ovarian syndrome, I reviewed all her labs from November of last year.    My goal is to obtain a sleep study to rule out obstructive sleep apnea but given that the patient has cluster headaches at night as well as morning headaches she wakes up with there is a high risk of obesity hypoventilation which creates hypoxemia at night.    I would prefer to evaluate her in an in lab study if possible.   After spending a total time of  45 minutes face to face and additional time for physical and neurologic examination, review of laboratory studies,  personal review of imaging studies, reports and results of other testing and review of referral information / records as far as provided in visit, I have established the following assessments:  OHV, OSA, Morbid obesity, insomnia, sleep hygiene.    D/c caffeine in PM, create a bedroom conducive to sleep- eliminate the screens an devices. No electronics in the bedroom- go to bed before midnight, cool, quiet and dark. Reading - a book with pages.  Take melatonin 5 mg at 11 PM.  Don't go to bed to nest there.    My Plan is to proceed with:  1) prefer in-lab sleep study.   I would like to thank Doren Custard, FNP and Doren Custard, Fnp [address] [place],  [place] [address] for allowing me to meet with and to take care of this pleasant patient.   In short, JONISE WEIGHTMAN will follow up either personally or through our NP within 2-3  month.     Electronically signed by: Melvyn Novas, MD 03/24/2019 10:04 AM  [facility] Board certified by The [employer] of Sleep Medicine and Diplomate of the [employer] of Sleep Medicine. Board certified In Neurology through the ABPN,  Fellow of the [employer] of Neu[employer]. Medical Director of [facility].

## 2019-03-24 NOTE — Patient Instructions (Signed)

## 2019-03-25 MED ORDER — VITAMIN D (ERGOCALCIFEROL) 1.25 MG (50000 UNIT) PO CAPS: 50000.0000 [IU] | ORAL_CAPSULE | ORAL | 0 refills | Status: DC

## 2019-03-28 ENCOUNTER — Encounter (INDEPENDENT_AMBULATORY_CARE_PROVIDER_SITE_OTHER): Payer: Self-pay | Admitting: Family Medicine

## 2019-03-28 NOTE — Progress Notes (Signed)
Chief Complaint:   OBESITY Sarah Burke is here to discuss her progress with her obesity treatment plan along with follow-up of her obesity related diagnoses. [name] is on the Category 3 Plan and states she is following her eating plan approximately 99% of the time. [name] states she is walking 45 minutes 3 times per week.  Today's visit was #: 2 Starting weight: 254 lbs Starting date: 03/10/2019 Today's weight: 251 lbs Today's date: 03/24/2019 Total lbs lost to date: 3 Total lbs lost since last in-office visit: 3  Interim History: Sarah Burke is working to stay on the plan. Her 87 year old daughter manages her snacks, and gave her 3 grapes and a few crackers.  Subjective:   Vitamin D deficiency  [name]'s Vitamin D level was 7.3 on 03/10/2019. She is not currently taking vit D. She denies nausea, vomiting or muscle weakness.  Prediabetes Sarah Burke has a diagnosis of prediabetes based on her elevated Hgb A1c and was informed this puts her at greater risk of developing diabetes. Her last A1c was 6.1 (03/10/19). Sarah Burke was previously on metformin with no side effects, other than constipation, but she did not see the benefit of metformin. She continues to work on diet and exercise to decrease her risk of diabetes. She denies nausea or hypoglycemia.  Lab Results  Component Value Date   HGBA1C 6.1 (H) 03/10/2019   Lab Results  Component Value Date   INSULIN 18.5 03/10/2019   Other hyperlipidemia Sarah Burke has hyperlipidemia and she has LDL of 126 (03/10/19). She has been trying to improve her cholesterol levels with intensive lifestyle modification including a low saturated fat diet, exercise and weight loss.  Lab Results  Component Value Date   ALT 15 03/10/2019   AST 17 03/10/2019   ALKPHOS 87 03/10/2019   BILITOT 0.3 03/10/2019   Lab Results  Component Value Date   CHOL 190 03/10/2019   HDL 51 03/10/2019   LDLCALC 126 (H) 03/10/2019   TRIG 73 03/10/2019   CHOLHDL 2.8 12/13/2018   PCOS  (polycystic ovarian syndrome) Sarah Burke has a diagnosis of polycystic ovarian syndrome.  Lab Results  Component Value Date   HGBA1C 6.1 (H) 03/10/2019   HGBA1C 5.9 (H) 12/13/2018   HGBA1C 5.8 (H) 12/31/2017   Other depression, with emotional eating Sarah Burke is struggling with emotional eating and using food for comfort to the extent that it is negatively impacting her health. She is on Wellbutrin currently. She has been working on behavior modification techniques to help reduce her emotional eating and has been somewhat successful. She shows no sign of suicidal or homicidal ideations.  Assessment/Plan:   Vitamin D deficiency  Low Vitamin D level contributes to fatigue and are associated with obesity, breast, and colon cancer. [name] agrees to start prescription Vitamin D @50 ,000 IU every week #4 with no refills and she will follow-up for routine testing of Vitamin D, at least 2-3 times per year to avoid over-replacement.  Prediabetes Sarah Burke will read about Saxenda and let me know if it is okay to try for prior authorization. Sarah Burke will continue to work on weight loss, exercise, and decreasing simple carbohydrates to help decrease the risk of diabetes.   Other hyperlipidemia Cardiovascular risk and specific lipid/LDL goals reviewed.  We discussed several lifestyle modifications today and Sarah Burke will continue to work on diet, exercise and weight loss efforts. Orders and follow up as documented in patient record.   Counseling Intensive lifestyle modifications are the first line treatment for  this issue. . Dietary changes: Increase soluble fiber. Decrease simple carbohydrates. . Exercise changes: Moderate to vigorous-intensity aerobic activity 150 minutes per week if tolerated. . Lipid-lowering medications: see documented in medical record.  PCOS (polycystic ovarian syndrome) Intensive lifestyle modifications are first line treatment for this issue. We discussed several lifestyle modifications today and  she will continue to work on diet, exercise and weight loss efforts. Orders and follow up as documented in patient record.  Counseling . PCOS is a leading cause of menstrual irregularities and infertility. It is also associated with obesity, hirsutism (excessive hair growth on the face, chest, or back), and cardiovascular risk factors such as high cholesterol and insulin resistance. . Insulin resistance appears to play a central role.  . Women with PCOS have been shown to have impaired appetite-regulating hormones. . Metformin is one medication that can improve metabolic parameters.  . Women with polycystic ovary syndrome (PCOS) have an increased risk for cardiovascular disease (CVD) - European Journal of Preventive Cardiology.  Other depression, with emotional eating Behavior modification techniques were discussed today to help Sarah Burke deal with her emotional/non-hunger eating behaviors. Sarah Burke will continue with Wellbutrin. Orders and follow up as documented in patient record.   Class 3 severe obesity with serious comorbidity and body mass index (BMI) of 45.0 to 49.9 in adult, unspecified obesity type (HCC) Sarah Burke is currently in the action stage of change. As such, her goal is to continue with weight loss efforts. She has agreed to the Category 3 Plan.   Exercise goals: Sarah Burke will continue her current exercise regimen.  Behavioral modification strategies: increasing lean protein intake, decreasing simple carbohydrates, increasing vegetables and increasing water intake.  Sarah Burke has agreed to follow-up with our clinic in 2 weeks. She was informed of the importance of frequent follow-up visits to maximize her success with intensive lifestyle modifications for her multiple health conditions.   Objective:   Blood pressure 125/81, pulse 69, temperature 98.6 F (37 C), temperature source Oral, height 5\' 2"  (1.575 m), weight 251 lb (113.9 kg), last menstrual period 03/03/2019, SpO2 100 %. Body mass index  is 45.91 kg/m.  General: Cooperative, alert, well developed, in no acute distress. HEENT: Conjunctivae and lids unremarkable. Cardiovascular: Regular rhythm.  Lungs: Normal work of breathing. Neurologic: No focal deficits.   Lab Results  Component Value Date   CREATININE 0.84 03/10/2019   BUN 8 03/10/2019   NA 143 03/10/2019   K 4.4 03/10/2019   CL 106 03/10/2019   CO2 22 03/10/2019   Lab Results  Component Value Date   ALT 15 03/10/2019   AST 17 03/10/2019   ALKPHOS 87 03/10/2019   BILITOT 0.3 03/10/2019   Lab Results  Component Value Date   HGBA1C 6.1 (H) 03/10/2019   HGBA1C 5.9 (H) 12/13/2018   HGBA1C 5.8 (H) 12/31/2017   Lab Results  Component Value Date   INSULIN 18.5 03/10/2019   Lab Results  Component Value Date   TSH 2.330 03/10/2019   Lab Results  Component Value Date   CHOL 190 03/10/2019   HDL 51 03/10/2019   LDLCALC 126 (H) 03/10/2019   TRIG 73 03/10/2019   CHOLHDL 2.8 12/13/2018   Lab Results  Component Value Date   WBC 5.9 03/10/2019   HGB 12.1 03/10/2019   HCT 37.5 03/10/2019   MCV 84 03/10/2019   PLT 304 03/10/2019   Lab Results  Component Value Date   IRON 61 03/10/2019   TIBC 346 03/10/2019   FERRITIN 36 03/10/2019  Ref. Range 03/10/2019 13:16  Vitamin D, 25-Hydroxy Latest Ref Range: 30.0 - 100.0 ng/mL 7.3 (L)   Obesity Behavioral Intervention:   Approximately 15 minutes were spent on the discussion below.  ASK: We discussed the diagnosis of obesity with Hinton Dyer today and Shirline agreed to give [place] permission to discuss obesity behavioral modification therapy today.  ASSESS: Jakita has the diagnosis of obesity and her BMI today is 45.9. Siboney is in the action stage of change.   ADVISE: Claryce was educated on the multiple health risks of obesity as well as the benefit of weight loss to improve her health. She was advised of the need for long term treatment and the importance of lifestyle modifications to improve her current health and  to decrease her risk of future health problems.  AGREE: Multiple dietary modification options and treatment options were discussed and Jacque agreed to follow the recommendations documented in the above note.  ARRANGE: Nusaybah was educated on the importance of frequent visits to treat obesity as outlined per CMS and USPSTF guidelines and agreed to schedule her next follow up appointment today.  Attestation Statements:   Reviewed by clinician on day of visit: allergies, medications, problem list, medical history, surgical history, family history, social history, and previous encounter notes.  Corey Skains, am acting as Location manager for [employer], DO.  I have reviewed the above documentation for accuracy and completeness, and I agree with the above. Briscoe Deutscher, DO

## 2019-04-01 ENCOUNTER — Other Ambulatory Visit: Payer: Self-pay | Admitting: Family Medicine

## 2019-04-01 DIAGNOSIS — Z01419 Encounter for gynecological examination (general) (routine) without abnormal findings: Secondary | ICD-10-CM

## 2019-04-01 DIAGNOSIS — Z1231 Encounter for screening mammogram for malignant neoplasm of breast: Secondary | ICD-10-CM

## 2019-04-13 ENCOUNTER — Ambulatory Visit (INDEPENDENT_AMBULATORY_CARE_PROVIDER_SITE_OTHER): Payer: Medicaid Other | Admitting: Neurology

## 2019-04-13 ENCOUNTER — Other Ambulatory Visit: Payer: Self-pay

## 2019-04-13 ENCOUNTER — Encounter (INDEPENDENT_AMBULATORY_CARE_PROVIDER_SITE_OTHER): Payer: Self-pay | Admitting: Family Medicine

## 2019-04-13 ENCOUNTER — Ambulatory Visit (INDEPENDENT_AMBULATORY_CARE_PROVIDER_SITE_OTHER): Payer: Medicaid Other | Admitting: Family Medicine

## 2019-04-13 VITALS — BP 120/78 | HR 65 | Temp 98.2°F | Ht 62.0 in | Wt 247.0 lb

## 2019-04-13 DIAGNOSIS — R7303 Prediabetes: Secondary | ICD-10-CM | POA: Diagnosis not present

## 2019-04-13 DIAGNOSIS — Z6841 Body Mass Index (BMI) 40.0 and over, adult: Secondary | ICD-10-CM

## 2019-04-13 DIAGNOSIS — Z8669 Personal history of other diseases of the nervous system and sense organs: Secondary | ICD-10-CM

## 2019-04-13 DIAGNOSIS — E559 Vitamin D deficiency, unspecified: Secondary | ICD-10-CM

## 2019-04-13 DIAGNOSIS — E282 Polycystic ovarian syndrome: Secondary | ICD-10-CM | POA: Diagnosis not present

## 2019-04-13 DIAGNOSIS — E8881 Metabolic syndrome: Secondary | ICD-10-CM

## 2019-04-13 DIAGNOSIS — F3289 Other specified depressive episodes: Secondary | ICD-10-CM | POA: Diagnosis not present

## 2019-04-13 DIAGNOSIS — F5104 Psychophysiologic insomnia: Secondary | ICD-10-CM

## 2019-04-13 DIAGNOSIS — G4719 Other hypersomnia: Secondary | ICD-10-CM

## 2019-04-13 DIAGNOSIS — Z72821 Inadequate sleep hygiene: Secondary | ICD-10-CM

## 2019-04-13 DIAGNOSIS — R0683 Snoring: Secondary | ICD-10-CM

## 2019-04-13 DIAGNOSIS — E662 Morbid (severe) obesity with alveolar hypoventilation: Secondary | ICD-10-CM

## 2019-04-13 DIAGNOSIS — G44009 Cluster headache syndrome, unspecified, not intractable: Secondary | ICD-10-CM

## 2019-04-13 MED ORDER — METFORMIN HCL 500 MG PO TABS: 500.0000 mg | ORAL_TABLET | Freq: Every day | ORAL | 0 refills | Status: DC

## 2019-04-13 NOTE — Progress Notes (Signed)
Chief Complaint:   OBESITY Sarah Burke is here to discuss her progress with her obesity treatment plan along with follow-up of her obesity related diagnoses. Sarah Burke is on the Category 3 Plan and states she is following her eating plan approximately 94% of the time. Sarah Burke states she is walking for 30-45 minutes 3 times per week.  Today's visit was #: 3 Starting weight: 254 lbs Starting date: 03/10/2019 Today's weight: 247 lbs Today's date: 04/13/2019 Total lbs lost to date: 7 lbs Total lbs lost since last in-office visit: 4 lbs  Interim History: Sarah Burke provided the following food recall today: Breakfast:  Protein pancakes / smoothie / yogurt / Special K and protein smoothie. Lunch:  Sandwih or microwave meal. Dinner:  Around 6 ounces of protein at dinner.  Working on getting to 8 ounces consistently. Snacks:  Pita/tomato, popcorn, Yasso. Drink:  Water - 4 x 16 ounces, sugar-free tea, flavored water.  Sarah Burke is up 1 pound of water.  Subjective:   1. Vitamin D deficiency Sarah Burke's Vitamin D level was 7.3 on 03/10/2019. She is currently taking vit D. She denies nausea, vomiting or muscle weakness.  2. PCOS (polycystic ovarian syndrome) Sarah Burke has a diagnosis of polycystic ovarian syndrome.  3. Prediabetes Sarah Burke has a diagnosis of prediabetes based on her elevated HgA1c and was informed this puts her at greater risk of developing diabetes. She continues to work on diet and exercise to decrease her risk of diabetes. She denies nausea or hypoglycemia.  Lab Results  Component Value Date   HGBA1C 6.1 (H) 03/10/2019   Lab Results  Component Value Date   INSULIN 18.5 03/10/2019   4. Excessive daytime sleepiness Sarah Burke suffers from excessive daytime sleepiness.  5. Other depression, with emotional eating Sarah Burke is struggling with emotional eating and using food for comfort to the extent that it is negatively impacting her health. She has been working on behavior modification techniques to help reduce  her emotional eating and has been unsuccessful. She shows no sign of suicidal or homicidal ideations.  Assessment/Plan:   1. Vitamin D deficiency Low Vitamin D level contributes to fatigue and are associated with obesity, breast, and colon cancer. She agrees to continue to take prescription Vitamin D @50 ,000 IU every week and will follow-up for routine testing of Vitamin D, at least 2-3 times per year to avoid over-replacement.  2. PCOS (polycystic ovarian syndrome) Counseling: Intensive lifestyle modifications are the first line treatment for this issue. We discussed several lifestyle modifications today and she will continue to work on diet, exercise and weight loss efforts. We will continue to monitor. Orders and follow up as documented in patient record.  3. Prediabetes Sarah Burke will continue to work on weight loss, exercise, and decreasing simple carbohydrates to help decrease the risk of diabetes.   Orders - metFORMIN (GLUCOPHAGE) 500 MG tablet; Take 1 tablet (500 mg total) by mouth daily.  Dispense: 30 tablet; Refill: 0  4. Excessive daytime sleepiness Sarah Burke has a sleep study tonight.  5. Other depression, with emotional eating Behavior modification techniques were discussed today to help Sarah Burke deal with her emotional/non-hunger eating behaviors.  Orders and follow up as documented in patient record.   6. Class 3 severe obesity with serious comorbidity and body mass index (BMI) of 45.0 to 49.9 in adult, unspecified obesity type (HCC) Sarah Burke is currently in the action stage of change. As such, her goal is to continue with weight loss efforts. She has agreed to the Category 3 Plan.  Exercise goals: As is.  Behavioral modification strategies: increasing lean protein intake and increasing water intake.  Sarah Burke has agreed to follow-up with our clinic in 2 weeks. She was informed of the importance of frequent follow-up visits to maximize her success with intensive lifestyle modifications for her  multiple health conditions.   Objective:   Blood pressure 120/78, pulse 65, temperature 98.2 F (36.8 C), temperature source Oral, height 5\' 2"  (1.575 m), weight 247 lb (112 kg), last menstrual period 04/08/2019, SpO2 99 %. Body mass index is 45.18 kg/m.  General: Cooperative, alert, well developed, in no acute distress. HEENT: Conjunctivae and lids unremarkable. Cardiovascular: Regular rhythm.  Lungs: Normal work of breathing. Neurologic: No focal deficits.   Lab Results  Component Value Date   CREATININE 0.84 03/10/2019   BUN 8 03/10/2019   NA 143 03/10/2019   K 4.4 03/10/2019   CL 106 03/10/2019   CO2 22 03/10/2019   Lab Results  Component Value Date   ALT 15 03/10/2019   AST 17 03/10/2019   ALKPHOS 87 03/10/2019   BILITOT 0.3 03/10/2019   Lab Results  Component Value Date   HGBA1C 6.1 (H) 03/10/2019   HGBA1C 5.9 (H) 12/13/2018   HGBA1C 5.8 (H) 12/31/2017   Lab Results  Component Value Date   INSULIN 18.5 03/10/2019   Lab Results  Component Value Date   TSH 2.330 03/10/2019   Lab Results  Component Value Date   CHOL 190 03/10/2019   HDL 51 03/10/2019   LDLCALC 126 (H) 03/10/2019   TRIG 73 03/10/2019   CHOLHDL 2.8 12/13/2018   Lab Results  Component Value Date   WBC 5.9 03/10/2019   HGB 12.1 03/10/2019   HCT 37.5 03/10/2019   MCV 84 03/10/2019   PLT 304 03/10/2019   Lab Results  Component Value Date   IRON 61 03/10/2019   TIBC 346 03/10/2019   FERRITIN 36 03/10/2019   Attestation Statements:   Reviewed by clinician on day of visit: allergies, medications, problem list, medical history, surgical history, family history, social history, and previous encounter notes.  I, 03/12/2019, CMA, am acting as Insurance claims handler for Energy manager, DO.  I have reviewed the above documentation for accuracy and completeness, and I agree with the above. [employer], DO

## 2019-04-27 DIAGNOSIS — E662 Morbid (severe) obesity with alveolar hypoventilation: Secondary | ICD-10-CM | POA: Insufficient documentation

## 2019-04-27 DIAGNOSIS — Z6841 Body Mass Index (BMI) 40.0 and over, adult: Secondary | ICD-10-CM | POA: Insufficient documentation

## 2019-04-27 DIAGNOSIS — Z72821 Inadequate sleep hygiene: Secondary | ICD-10-CM | POA: Insufficient documentation

## 2019-04-27 DIAGNOSIS — G44009 Cluster headache syndrome, unspecified, not intractable: Secondary | ICD-10-CM | POA: Insufficient documentation

## 2019-04-27 DIAGNOSIS — F5104 Psychophysiologic insomnia: Secondary | ICD-10-CM | POA: Insufficient documentation

## 2019-04-27 DIAGNOSIS — E66813 Obesity, class 3: Secondary | ICD-10-CM | POA: Insufficient documentation

## 2019-04-27 NOTE — Procedures (Signed)
PATIENT'S NAME:  Sarah Burke, Sarah Burke DOB:      [birth year]      MR#:    [id number]     DATE OF RECORDING: 04/13/2019 REFERRING M.D.:  Helane Rima, DO Study Performed:   Baseline Polysomnogram HISTORY:  Sarah Burke is a 39 year- old African American female patient seen on 03/24/2019, referred by [facility], through Dr Helane Rima, DO. Chief concern according to patient :  " my primary referred me a while ago before I joined [facility]', I have a hard time falling asleep, I wake up snoring and with headaches" Sometimes I wake from headaches in the middle of the night. I go to the bathroom 3-4 times nightly.    Sarah Burke has a past medical history of Anxiety, Back pain, Chest pain, Constipation, Edema, lower extremity, GERD (gastroesophageal reflux disease), High cholesterol, History of PCOS, Hypertension, Infertility, female, Prediabetes, and Shortness of breath. She has headaches in AM and headaches that are sharp and stabbing and wake her up out of sleep- possible Cluster HA ?Marland Kitchen   Sleep relevant medical history: Bell's palsy 2 years ago- right face, Nocturia, Sleep walking; yes in childhood, daughter affected too- mother used to- she endorsed Night terrors- in her daughter, no Tonsillectomy but wisdom teeth were removed, and she endorsed neck pain - she gets numb in both arms, mostly left - arising from the neck at night. No family history of OSA.     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 she lives in a household with 4 persons, her parents and her daughter- Pets are present, she has a Nurse, mental health.  The patient endorsed the Epworth Sleepiness Scale at 14/24 points.   The patient's weight 253 pounds with a height of 62 (inches), resulting in a BMI of 46.7 kg/m2. The patient's neck circumference measured 16.5 inches.  CURRENT MEDICATIONS: Tylenol, Wellbutrin XL.   PROCEDURE:  This is a multichannel digital polysomnogram  utilizing the Somnostar 11.2 system.  Electrodes and sensors were applied and monitored per AASM Specifications.   EEG, EOG, Chin and Limb EMG, were sampled at 200 Hz.  ECG, Snore and Nasal Pressure, Thermal Airflow, Respiratory Effort, CPAP Flow and Pressure, Oximetry was sampled at 50 Hz. Digital video and audio were recorded.      BASELINE STUDY: Lights Out was at 21:59 and Lights On at 05:00.  Total recording time (TRT) was 421 minutes, with a total sleep time (TST) of 317 minutes.   The patient's sleep latency was 71.5 minutes.  REM latency was 146.5 minutes.  The sleep efficiency was poor at  75.3 %.     SLEEP ARCHITECTURE: WASO (Wake after sleep onset) was 39.5 minutes.  There were 13 minutes in Stage N1, 199 minutes Stage N2, 74.5 minutes Stage N3 and 30.5 minutes in Stage REM.  The percentage of Stage N1 was 4.1%, Stage N2 was 62.8%, Stage N3 was 23.5% and Stage R (REM sleep) was 9.6%.     RESPIRATORY ANALYSIS:  There were a total of 13 respiratory events:  1 obstructive apnea, 0 central apneas and 0 mixed apneas with a total of 1 apnea and 12 hypopneas .     The total APNEA/HYPOPNEA INDEX (AHI) was 2.5/hour.  6 events occurred in REM sleep and 12 events in NREM. The REM AHI was  11.8 /hour, versus a non-REM AHI of 1.5. The patient spent 44.5 minutes of total sleep time  in the supine position and 273 minutes in non-supine.. The supine AHI was 8.1 versus a non-supine AHI of 1.5.  OXYGEN SATURATION & C02:  The Wake baseline 02 saturation was 97%, with the lowest being 91%. Time spent below 89% saturation equaled 0 minutes. The patient had frequent leg movements when awake, not in sleep.   The arousals were noted as: most of the arousals ( 62) were spontaneous, 0 were associated with PLMs, 4 were associated with respiratory events.  The patient had a total of 0 Periodic Limb Movements.   Audio and video analysis did not show any abnormal or unusual movements, behaviors, phonations or  vocalizations.   Snoring was noted. EKG was in keeping with regular rhythm - some QRS-complexes were split and may indicate a BBB. See screen shot Epoch 542.   IMPRESSION:  1. No clinically relevant degree of Obstructive Sleep Apnea(OSA) at AHI 2.5/h and no significant hypoxia. 2. Mild Snoring 3. Non-specific abnormal EKG 4. Fragmented sleep architecture.    RECOMMENDATIONS:  NO return to sleep clinic needed but optional. No intervention is required. Spontaneous arousals are often related to high anxiety, depression and can be due to discomfort.   I certify that I have reviewed the entire raw data recording prior to the issuance of this report in accordance with the Standards of Accreditation of the [employer] (AASM)   Larey Seat, MD Medical Director, [facility], [place] of Neurology and Sleep Medicine (Neurology and Sleep Medicine)

## 2019-04-27 NOTE — Progress Notes (Signed)
IMPRESSION:  1. No clinically relevant degree of Obstructive Sleep Apnea (OSA) at AHI 2.5/h and no significant hypoxia. 2. Mild Snoring 3. Non-specific abnormal EKG 4. Fragmented sleep architecture.    RECOMMENDATIONS:  No return to sleep clinic needed but optional- her headaches appear unrelated to hypoxia. No intervention is required. Spontaneous arousals are often related to high anxiety, depression and can be due to discomfort.

## 2019-04-28 ENCOUNTER — Telehealth: Payer: Self-pay | Admitting: Neurology

## 2019-04-28 NOTE — Telephone Encounter (Signed)
Called the patient to review the sleep study results with her. Advised that overall there was no significant apnea present. Encouraged the patient to sleep on her side as apnea was worse on her back. Advised that snoring was present but oxygen level looked good. Patient verbalized understanding and had no questions. Advised that she would not need to follow up on the sleep clinic.

## 2019-04-28 NOTE — Telephone Encounter (Signed)
-----   Message from Melvyn Novas, MD sent at 04/27/2019  6:00 PM EDT ----- IMPRESSION:  1. No clinically relevant degree of Obstructive Sleep Apnea (OSA) at AHI 2.5/h and no significant hypoxia. 2. Mild Snoring 3. Non-specific abnormal EKG 4. Fragmented sleep architecture.    RECOMMENDATIONS:  No return to sleep clinic needed but optional- her headaches appear unrelated to hypoxia. No intervention is required. Spontaneous arousals are often related to high anxiety, depression and can be due to discomfort.

## 2019-04-29 DIAGNOSIS — R300 Dysuria: Secondary | ICD-10-CM | POA: Diagnosis not present

## 2019-04-29 DIAGNOSIS — R102 Pelvic and perineal pain: Secondary | ICD-10-CM | POA: Diagnosis not present

## 2019-04-29 DIAGNOSIS — Z3202 Encounter for pregnancy test, result negative: Secondary | ICD-10-CM | POA: Diagnosis not present

## 2019-05-02 ENCOUNTER — Ambulatory Visit (INDEPENDENT_AMBULATORY_CARE_PROVIDER_SITE_OTHER): Payer: Medicaid Other | Admitting: Family Medicine

## 2019-05-02 ENCOUNTER — Other Ambulatory Visit (INDEPENDENT_AMBULATORY_CARE_PROVIDER_SITE_OTHER): Payer: Self-pay | Admitting: Bariatrics

## 2019-05-02 ENCOUNTER — Other Ambulatory Visit: Payer: Self-pay

## 2019-05-02 ENCOUNTER — Encounter (INDEPENDENT_AMBULATORY_CARE_PROVIDER_SITE_OTHER): Payer: Self-pay | Admitting: Bariatrics

## 2019-05-02 ENCOUNTER — Ambulatory Visit (INDEPENDENT_AMBULATORY_CARE_PROVIDER_SITE_OTHER): Payer: Medicaid Other | Admitting: Bariatrics

## 2019-05-02 VITALS — BP 107/66 | HR 80 | Temp 99.1°F | Ht 62.0 in | Wt 247.0 lb

## 2019-05-02 DIAGNOSIS — E559 Vitamin D deficiency, unspecified: Secondary | ICD-10-CM

## 2019-05-02 DIAGNOSIS — Z6841 Body Mass Index (BMI) 40.0 and over, adult: Secondary | ICD-10-CM | POA: Diagnosis not present

## 2019-05-02 DIAGNOSIS — R7303 Prediabetes: Secondary | ICD-10-CM

## 2019-05-02 MED ORDER — VITAMIN D (ERGOCALCIFEROL) 1.25 MG (50000 UNIT) PO CAPS: 50000.0000 [IU] | ORAL_CAPSULE | ORAL | 0 refills | Status: DC

## 2019-05-02 NOTE — Progress Notes (Signed)
Chief Complaint:   Sarah Burke is here to discuss her progress with her obesity treatment plan along with follow-up of her obesity related diagnoses. Sarah Burke is on the Category 3 Plan and states she is following her eating plan approximately 96% of the time. Sarah Burke states she is walking 45 minutes 3 times per week.  Today's visit was #: 4 Starting weight: 254 lbs Starting date: 03/10/2019 Today's weight: 247 lbs Today's date: 05/02/2019 Total lbs lost to date: 7 Total lbs lost since last in-office visit: 0  Interim History: Sarah Burke's weight remains the same. She has not struggled.  Subjective:   Vitamin D deficiency. Sarah Burke is taking Vitamin D. Last Vitamin D 7.3 on 03/10/2019.  Prediabetes. Sarah Burke has a diagnosis of prediabetes based on her elevated HgA1c and was informed this puts her at greater risk of developing diabetes. She continues to work on diet and exercise to decrease her risk of diabetes. She denies nausea or hypoglycemia. Sarah Burke is taking Glucophage.  Lab Results  Component Value Date   HGBA1C 6.1 (H) 03/10/2019   Lab Results  Component Value Date   INSULIN 18.5 03/10/2019   Assessment/Plan:   Vitamin D deficiency. Low Vitamin D level contributes to fatigue and are associated with obesity, breast, and colon cancer. She was given a prescription for Vitamin D, Ergocalciferol, (DRISDOL) 1.25 MG (50000 UNIT) CAPS capsule every week #4 with 0 refills and will follow-up for routine testing of Vitamin D, at least 2-3 times per year to avoid over-replacement.    Prediabetes. [name] will continue to work on weight loss, exercise, increasing healthy fats and protein, and decreasing simple carbohydrates to help decrease the risk of diabetes.   Class 3 severe obesity with serious comorbidity and body mass index (BMI) of 45.0 to 49.9 in adult, unspecified obesity type ([place]).  [name] is currently in the action stage of change. As such, her goal is to continue with weight loss  efforts. She has agreed to the Category 3 Plan.   She will work on meal planning, increasing her water and protein intake, and will start using the scale.  Exercise goals: Sarah Burke will continue walking.  Behavioral modification strategies: increasing lean protein intake, decreasing simple carbohydrates, increasing vegetables, increasing water intake, decreasing eating out, no skipping meals, meal planning and cooking strategies, keeping healthy foods in the home and planning for success.  Sarah Burke has agreed to follow-up with our clinic in 2 weeks. She was informed of the importance of frequent follow-up visits to maximize her success with intensive lifestyle modifications for her multiple health conditions.   Objective:   Blood pressure 107/66, pulse 80, temperature 99.1 F (37.3 C), temperature source Oral, height 5\' 2"  (1.575 m), weight 247 lb (112 kg), last menstrual period 04/08/2019, SpO2 99 %. Body mass index is 45.18 kg/m.  General: Cooperative, alert, well developed, in no acute distress. HEENT: Conjunctivae and lids unremarkable. Cardiovascular: Regular rhythm.  Lungs: Normal work of breathing. Neurologic: No focal deficits.   Lab Results  Component Value Date   CREATININE 0.84 03/10/2019   BUN 8 03/10/2019   NA 143 03/10/2019   K 4.4 03/10/2019   CL 106 03/10/2019   CO2 22 03/10/2019   Lab Results  Component Value Date   ALT 15 03/10/2019   AST 17 03/10/2019   ALKPHOS 87 03/10/2019   BILITOT 0.3 03/10/2019   Lab Results  Component Value Date   HGBA1C 6.1 (H) 03/10/2019   HGBA1C 5.9 (H) 12/13/2018  HGBA1C 5.8 (H) 12/31/2017   Lab Results  Component Value Date   INSULIN 18.5 03/10/2019   Lab Results  Component Value Date   TSH 2.330 03/10/2019   Lab Results  Component Value Date   CHOL 190 03/10/2019   HDL 51 03/10/2019   LDLCALC 126 (H) 03/10/2019   TRIG 73 03/10/2019   CHOLHDL 2.8 12/13/2018   Lab Results  Component Value Date   WBC 5.9 03/10/2019     HGB 12.1 03/10/2019   HCT 37.5 03/10/2019   MCV 84 03/10/2019   PLT 304 03/10/2019   Lab Results  Component Value Date   IRON 61 03/10/2019   TIBC 346 03/10/2019   FERRITIN 36 03/10/2019   Attestation Statements:   Reviewed by clinician on day of visit: allergies, medications, problem list, medical history, surgical history, family history, social history, and previous encounter notes.  Fernanda Drum, am acting as Energy manager for [employer], DO   I have reviewed the above documentation for accuracy and completeness, and I agree with the above. Corinna Capra, DO

## 2019-05-03 ENCOUNTER — Encounter: Payer: Self-pay | Admitting: Family Medicine

## 2019-05-23 ENCOUNTER — Ambulatory Visit (INDEPENDENT_AMBULATORY_CARE_PROVIDER_SITE_OTHER): Payer: Medicaid Other | Admitting: Family Medicine

## 2019-05-23 ENCOUNTER — Encounter (INDEPENDENT_AMBULATORY_CARE_PROVIDER_SITE_OTHER): Payer: Self-pay | Admitting: Family Medicine

## 2019-05-23 ENCOUNTER — Other Ambulatory Visit: Payer: Self-pay

## 2019-05-23 VITALS — BP 115/72 | HR 67 | Temp 98.4°F | Ht 62.0 in | Wt 244.0 lb

## 2019-05-23 DIAGNOSIS — E559 Vitamin D deficiency, unspecified: Secondary | ICD-10-CM | POA: Diagnosis not present

## 2019-05-23 DIAGNOSIS — Z6841 Body Mass Index (BMI) 40.0 and over, adult: Secondary | ICD-10-CM

## 2019-05-23 DIAGNOSIS — R7303 Prediabetes: Secondary | ICD-10-CM

## 2019-05-23 DIAGNOSIS — Z9189 Other specified personal risk factors, not elsewhere classified: Secondary | ICD-10-CM

## 2019-05-23 DIAGNOSIS — G4709 Other insomnia: Secondary | ICD-10-CM

## 2019-05-23 DIAGNOSIS — E66813 Obesity, class 3: Secondary | ICD-10-CM

## 2019-05-23 MED ORDER — TRAZODONE HCL 50 MG PO TABS: 25.0000 mg | ORAL_TABLET | Freq: Every evening | ORAL | 0 refills | Status: DC | PRN

## 2019-05-23 NOTE — Progress Notes (Signed)
Chief Complaint:   OBESITY Sarah Burke is here to discuss her progress with her obesity treatment plan along with follow-up of her obesity related diagnoses. [name] is on the Category 3 Plan and states she is following her eating plan approximately 75% of the time. Sarah Burke states she is walking for 30 minutes 3-4 times per week.  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  She reports that it is "depressing".  She has a daughter in [place] (online).  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   Subjective:   1. Prediabetes Meridith has a diagnosis of prediabetes based on her elevated HgA1c and was informed this puts her at greater risk of developing diabetes. She continues to work on diet and exercise to decrease her risk of diabetes. She denies nausea or hypoglycemia.  Lab Results  Component Value Date   HGBA1C 6.1 (H) 03/10/2019   Lab Results  Component Value Date   INSULIN 18.5 03/10/2019   2. Vitamin D deficiency Sarah Burke's Vitamin D level was 7.3 on 03/10/2019. She is currently taking prescription vitamin D 50,000 IU each week. She denies nausea, vomiting or muscle weakness.  3. Other insomnia Sarah Burke endorses difficulty sleeping.  Assessment/Plan:   1. Prediabetes Sarah Burke will continue to work on weight loss, exercise, and decreasing simple carbohydrates to help decrease the risk of diabetes.   2. Vitamin D deficiency Low Vitamin D level contributes to fatigue and are associated with obesity, breast, and colon cancer. She agrees to continue to take prescription Vitamin D @50 ,000 IU every week and will follow-up for routine testing of Vitamin D, at least 2-3 times per year to avoid over-replacement.  3. Other insomnia Will try Marena on  trazodone 50 mg 1/2 to 1 tablet at bedtime for insomnia.  4. At risk for side effect of medication Yanai was given approximately 15 minutes of drug side effect counseling today.  We discussed side effect possibility and risk versus benefits. Lalaine agreed to the medication and will contact this office if these side effects are intolerable.  Repetitive spaced learning was employed today to elicit superior memory formation and behavioral change.  5. Class 3 severe obesity with serious comorbidity and body mass index (BMI) of 40.0 to 44.9 in adult, unspecified obesity type (HCC) Sarah Burke is currently in the action stage of change. As such, her goal is to continue with weight loss efforts. She has agreed to the Category 3 Plan.   Exercise goals: For substantial health benefits, adults should do at least 150 minutes (2 hours and 30 minutes) a week of moderate-intensity, or 75 minutes (1 hour and 15 minutes) a week of vigorous-intensity aerobic physical activity, or an equivalent combination of moderate- and vigorous-intensity aerobic activity. Aerobic activity should be performed in episodes of at least 10 minutes, and preferably, it should be spread throughout the week.  Behavioral modification strategies: increasing lean protein intake and increasing water intake.  Sarah Burke has agreed to follow-up with our clinic in 2 weeks. She was informed of the importance of frequent follow-up visits to maximize her success with intensive lifestyle modifications for her multiple health conditions.   Objective:   Blood pressure 115/72, pulse 67, temperature 98.4 F (36.9 C), temperature source Oral, height  5\' 2"  (1.575 m), weight 244 lb (110.7 kg), last menstrual period 05/13/2019, SpO2 99 %. Body mass index is 44.63 kg/m.  General: Cooperative, alert, well developed, in no acute distress. HEENT: Conjunctivae and lids unremarkable. Cardiovascular: Regular rhythm.  Lungs: Normal work of breathing. Neurologic: No focal  deficits.   Lab Results  Component Value Date   CREATININE 0.84 03/10/2019   BUN 8 03/10/2019   NA 143 03/10/2019   K 4.4 03/10/2019   CL 106 03/10/2019   CO2 22 03/10/2019   Lab Results  Component Value Date   ALT 15 03/10/2019   AST 17 03/10/2019   ALKPHOS 87 03/10/2019   BILITOT 0.3 03/10/2019   Lab Results  Component Value Date   HGBA1C 6.1 (H) 03/10/2019   HGBA1C 5.9 (H) 12/13/2018   HGBA1C 5.8 (H) 12/31/2017   Lab Results  Component Value Date   INSULIN 18.5 03/10/2019   Lab Results  Component Value Date   TSH 2.330 03/10/2019   Lab Results  Component Value Date   CHOL 190 03/10/2019   HDL 51 03/10/2019   LDLCALC 126 (H) 03/10/2019   TRIG 73 03/10/2019   CHOLHDL 2.8 12/13/2018   Lab Results  Component Value Date   WBC 5.9 03/10/2019   HGB 12.1 03/10/2019   HCT 37.5 03/10/2019   MCV 84 03/10/2019   PLT 304 03/10/2019   Lab Results  Component Value Date   IRON 61 03/10/2019   TIBC 346 03/10/2019   FERRITIN 36 03/10/2019   Attestation Statements:   Reviewed by clinician on day of visit: allergies, medications, problem list, medical history, surgical history, family history, social history, and previous encounter notes.  I, Water quality scientist, CMA, am acting as Location manager for [employer], DO.  I have reviewed the above documentation for accuracy and completeness, and I agree with the above. Briscoe Deutscher, DO

## 2019-06-15 ENCOUNTER — Other Ambulatory Visit: Payer: Self-pay | Admitting: Family Medicine

## 2019-06-15 ENCOUNTER — Ambulatory Visit (INDEPENDENT_AMBULATORY_CARE_PROVIDER_SITE_OTHER): Payer: Medicaid Other | Admitting: Family Medicine

## 2019-06-15 ENCOUNTER — Encounter (INDEPENDENT_AMBULATORY_CARE_PROVIDER_SITE_OTHER): Payer: Self-pay | Admitting: *Deleted

## 2019-06-15 DIAGNOSIS — F4323 Adjustment disorder with mixed anxiety and depressed mood: Secondary | ICD-10-CM

## 2019-06-15 DIAGNOSIS — Z6841 Body Mass Index (BMI) 40.0 and over, adult: Secondary | ICD-10-CM

## 2019-06-15 NOTE — Telephone Encounter (Signed)
Requested Prescriptions  Pending Prescriptions Disp Refills  . buPROPion (WELLBUTRIN XL) 150 MG 24 hr tablet [Pharmacy Med Name: BUPROPION XL 150MG  TABLETS (24 H)] 90 tablet 0    Sig: TAKE 1 TABLET(150 MG) BY MOUTH DAILY     Psychiatry: Antidepressants - bupropion Failed - 06/15/2019  1:35 AM      Failed - Valid encounter within last 6 months    Recent Outpatient Visits          6 months ago Metabolic syndrome   [facility], FNP   9 months ago Candida vaginitis   [facility], FNP   11 months ago Candida vaginitis   [facility], FNP   1 year ago Well woman exam   [facility], FNP   1 year ago Class 3 severe obesity due to excess calories with serious comorbidity and body mass index (BMI) of 40.0 to 44.9 in adult [facility])   [facility], FNP      Future Appointments            In 4 weeks Doren Custard, FNP [facility]           Passed - Completed PHQ-2 or PHQ-9 in the last 360 days.      Passed - Last BP in normal range    BP Readings from Last 1 Encounters:  05/23/19 115/72         Patient was seen 6 months ago on 12/13/2018.  Has upcoming appt.

## 2019-06-20 ENCOUNTER — Encounter (INDEPENDENT_AMBULATORY_CARE_PROVIDER_SITE_OTHER): Payer: Self-pay | Admitting: Family Medicine

## 2019-06-20 ENCOUNTER — Ambulatory Visit (INDEPENDENT_AMBULATORY_CARE_PROVIDER_SITE_OTHER): Payer: Medicaid Other | Admitting: Physician Assistant

## 2019-06-20 ENCOUNTER — Encounter (INDEPENDENT_AMBULATORY_CARE_PROVIDER_SITE_OTHER): Payer: Self-pay | Admitting: Physician Assistant

## 2019-06-20 ENCOUNTER — Other Ambulatory Visit (INDEPENDENT_AMBULATORY_CARE_PROVIDER_SITE_OTHER): Payer: Self-pay | Admitting: Physician Assistant

## 2019-06-20 ENCOUNTER — Other Ambulatory Visit: Payer: Self-pay

## 2019-06-20 VITALS — BP 120/81 | HR 75 | Temp 98.3°F | Ht 62.0 in | Wt 245.0 lb

## 2019-06-20 DIAGNOSIS — F3289 Other specified depressive episodes: Secondary | ICD-10-CM

## 2019-06-20 DIAGNOSIS — R7303 Prediabetes: Secondary | ICD-10-CM

## 2019-06-20 DIAGNOSIS — G4709 Other insomnia: Secondary | ICD-10-CM

## 2019-06-20 DIAGNOSIS — E559 Vitamin D deficiency, unspecified: Secondary | ICD-10-CM

## 2019-06-20 DIAGNOSIS — Z6841 Body Mass Index (BMI) 40.0 and over, adult: Secondary | ICD-10-CM

## 2019-06-20 MED ORDER — VITAMIN D (ERGOCALCIFEROL) 1.25 MG (50000 UNIT) PO CAPS: 50000.0000 [IU] | ORAL_CAPSULE | ORAL | 0 refills | Status: DC
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
## 2019-06-20 NOTE — Progress Notes (Signed)
Chief Complaint:   Sarah Burke is here to discuss her progress with her obesity treatment plan along with follow-up of her obesity related diagnoses. Sarah Burke is on the Category 3 Plan and states she is following her eating plan approximately 75% of the time. Sarah Burke states she is exercising 0 minutes 0 times per week.  Today's visit was #: 6 Starting weight: 254 lbs Starting date: 03/10/2019 Today's weight: 245 lbs Today's date: 06/20/2019 Total lbs lost to date: 9 Total lbs lost since last in-office visit: 0  Interim History: Sarah Burke states that she skipped meals on a few occasions. She had an emotional last few weeks and overate her calories.  Subjective:   Vitamin D deficiency. Last Vitamin D 7.3 on 03/10/2019. Sarah Burke is on prescription Vitamin D weekly.  Prediabetes. Sarah Burke has a diagnosis of prediabetes based on her elevated HgA1c and was informed this puts her at greater risk of developing diabetes. She continues to work on diet and exercise to decrease her risk of diabetes. She denies nausea, vomiting, or diarrhea. Sarah Burke is on metformin.  Lab Results  Component Value Date   HGBA1C 6.1 (H) 03/10/2019   Lab Results  Component Value Date   INSULIN 18.5 03/10/2019   Other depression, with emotional eating. Sarah Burke is struggling with emotional eating and using food for comfort to the extent that it is negatively impacting her health. She has been working on behavior modification techniques to help reduce her emotional eating and has been somewhat successful. She shows no sign of suicidal or homicidal ideations. Sarah Burke is on Wellbutrin.  Other insomnia. Sarah Burke is on Trazodone and reports it is helping her sleep better.  Assessment/Plan:   Vitamin D deficiency. Low Vitamin D level contributes to fatigue and are associated with obesity, breast, and colon cancer. She was given a refill on her Vitamin D, Ergocalciferol, (DRISDOL) 1.25 MG (50000 UNIT) CAPS capsule every week #4 with 0  refills and will follow-up for routine testing of Vitamin D, at least 2-3 times per year to avoid over-replacement.     Prediabetes. Sarah Burke will continue to work on weight loss, exercise, and decreasing simple carbohydrates to help decrease the risk of diabetes. She will continue metformin as directed.  Other depression, with emotional eating. Behavior modification techniques were discussed today to help Sarah Burke deal with her emotional/non-hunger eating behaviors.  Orders and follow up as documented in patient record. She will be referred to Dr. Mallie Mussel, our bariatric psychologist, for evaluation.  Other insomnia. The problem of recurrent insomnia was discussed. Orders and follow up as documented in patient record. Counseling: Intensive lifestyle modifications are the first line treatment for this issue. We discussed several lifestyle modifications today and she will continue to work on diet, exercise and weight loss efforts. Sarah Burke will continue Trazodone as directed.  Counseling  Limit or avoid alcohol, caffeinated beverages, and cigarettes, especially close to bedtime.   Do not eat a large meal or eat spicy foods right before bedtime. This can lead to digestive discomfort that can make it hard for you to sleep.  Keep a sleep diary to help you and your health care provider figure out what could be causing your insomnia.  . Make your bedroom a dark, comfortable place where it is easy to fall asleep. ? Put up shades or blackout curtains to block light from outside. ? Use a white noise machine to block noise. ? Keep the temperature cool. . Limit screen use before bedtime. This includes: ?  Watching TV. ? Using your smartphone, tablet, or computer. . Stick to a routine that includes going to bed and waking up at the same times every day and night. This can help you fall asleep faster. Consider making a quiet activity, such as reading, part of your nighttime routine. . Try to avoid taking naps during the  day so that you sleep better at night. . Get out of bed if you are still awake after 15 minutes of trying to sleep. Keep the lights down, but try reading or doing a quiet activity. When you feel sleepy, go back to bed.  Class 3 severe obesity with serious comorbidity and body mass index (BMI) of 40.0 to 44.9 in adult, unspecified obesity type (HCC).  Sarah Burke is currently in the action stage of change. As such, her goal is to continue with weight loss efforts. She has agreed to the Category 3 Plan.   Exercise goals: For substantial health benefits, adults should do at least 150 minutes (2 hours and 30 minutes) a week of moderate-intensity, or 75 minutes (1 hour and 15 minutes) a week of vigorous-intensity aerobic physical activity, or an equivalent combination of moderate- and vigorous-intensity aerobic activity. Aerobic activity should be performed in episodes of at least 10 minutes, and preferably, it should be spread throughout the week.  Behavioral modification strategies: no skipping meals and meal planning and cooking strategies.  Sarah Burke has agreed to follow-up with our clinic in 2-3 weeks. She was informed of the importance of frequent follow-up visits to maximize her success with intensive lifestyle modifications for her multiple health conditions.   Objective:   Blood pressure 120/81, pulse 75, temperature 98.3 F (36.8 C), temperature source Oral, height 5\' 2"  (1.575 m), weight 245 lb (111.1 kg), SpO2 100 %. Body mass index is 44.81 kg/m.  General: Cooperative, alert, well developed, in no acute distress. HEENT: Conjunctivae and lids unremarkable. Cardiovascular: Regular rhythm.  Lungs: Normal work of breathing. Neurologic: No focal deficits.   Lab Results  Component Value Date   CREATININE 0.84 03/10/2019   BUN 8 03/10/2019   NA 143 03/10/2019   K 4.4 03/10/2019   CL 106 03/10/2019   CO2 22 03/10/2019   Lab Results  Component Value Date   ALT 15 03/10/2019   AST 17  03/10/2019   ALKPHOS 87 03/10/2019   BILITOT 0.3 03/10/2019   Lab Results  Component Value Date   HGBA1C 6.1 (H) 03/10/2019   HGBA1C 5.9 (H) 12/13/2018   HGBA1C 5.8 (H) 12/31/2017   Lab Results  Component Value Date   INSULIN 18.5 03/10/2019   Lab Results  Component Value Date   TSH 2.330 03/10/2019   Lab Results  Component Value Date   CHOL 190 03/10/2019   HDL 51 03/10/2019   LDLCALC 126 (H) 03/10/2019   TRIG 73 03/10/2019   CHOLHDL 2.8 12/13/2018   Lab Results  Component Value Date   WBC 5.9 03/10/2019   HGB 12.1 03/10/2019   HCT 37.5 03/10/2019   MCV 84 03/10/2019   PLT 304 03/10/2019   Lab Results  Component Value Date   IRON 61 03/10/2019   TIBC 346 03/10/2019   FERRITIN 36 03/10/2019   Attestation Statements:   Reviewed by clinician on day of visit: allergies, medications, problem list, medical history, surgical history, family history, social history, and previous encounter notes.  I01/30/2021, am acting as [employer] for [employer] manager, PA-C   I have reviewed the above documentation for accuracy and  completeness, and I agree with the above. Abby Potash, PA-C

## 2019-06-22 ENCOUNTER — Other Ambulatory Visit: Payer: Self-pay

## 2019-06-22 ENCOUNTER — Telehealth (INDEPENDENT_AMBULATORY_CARE_PROVIDER_SITE_OTHER): Payer: Medicaid Other | Admitting: Psychology

## 2019-06-22 NOTE — Progress Notes (Signed)
Office: [phone]  /  Fax: [phone]    Date: Jun 28, 2019   Appointment Start Time: 10:59am Duration: 35 minutes Provider: Glennie Burke, Psy.D. Type of Session: Intake for Individual Therapy  Location of Patient: Home Location of Provider: Provider's Home Type of Contact: Telepsychological Visit via MyChart Video Visit  Informed Consent: Prior to proceeding with today's appointment, two pieces of identifying information were obtained. In addition, Sarah Burke's physical location at the time of this appointment was obtained as well a phone number she could be reached at in the event of technical difficulties. Sarah Burke and this provider participated in today's telepsychological service.   The provider's role was explained to Sarah Burke. The provider reviewed and discussed issues of confidentiality, privacy, and limits therein (e.g., reporting obligations). In addition to verbal informed consent, written informed consent for psychological services was obtained prior to the initial appointment. Since the clinic is not a 24/7 crisis center, mental health emergency resources were shared and this  provider explained MyChart, e-mail, voicemail, and/or other messaging systems should be utilized only for non-emergency reasons. This provider also explained that information obtained during appointments will be placed in Sarah Burke's medical record and relevant information will be shared with other providers at [facility] for coordination of care. Moreover, Sarah Burke agreed information may be shared with other [facility] providers as needed for coordination of care. By signing the service agreement document, Sarah Burke provided written consent for coordination of care. Prior to initiating telepsychological services, Sarah Burke completed an informed consent document, which included the development of a safety plan (i.e., an emergency contact, nearest emergency room, and emergency resources) in the event  of an emergency/crisis. Sarah Burke expressed understanding of the rationale of the safety plan. Sarah Burke verbally acknowledged understanding she is ultimately responsible for understanding her insurance benefits for telepsychological and in-person services. This provider also reviewed confidentiality, as it relates to telepsychological services, as well as the rationale for telepsychological services (i.e., to reduce exposure risk to COVID-19). Sarah Burke  acknowledged understanding that appointments cannot be recorded without both party consent and she is aware she is responsible for securing confidentiality on her end of the session. Sarah Burke verbally consented to proceed.  Chief Complaint/HPI: Sarah Burke was referred by Sarah Potash, PA-C due to other depression, with emotional eating. Per the note for the visit with Sarah Potash, PA-C on Jun 20, 2019, "Sarah Burke is struggling with emotional eating and using food for comfort to the extent that it is negatively impacting her health. She has been working on behavior modification techniques to help reduce her emotional eating and has been somewhat successful. She shows no sign of suicidal or homicidal ideations. Sarah Burke is on Wellbutrin." The note for the initial appointment with Dr. Briscoe Burke on March 10, 2019 indicated the following: "Sarah Burke's habits were reviewed today and are as follows: Her family sometimes eats meals together, she thinks her family will possibly eat healthier with her, her desired weight loss is 82 pounds, she started gaining weight when she was diagnosed with PCOS in 2014, her heaviest weight ever was 260 pounds, she craves carbs like bread, pasta, and rice, she snacks in the evenings at times, she is frequently drinking liquids with calories, she sometimes makes poor food choices and she sometimes eats larger portions than normal." Sarah Burke's Food and Mood (modified PHQ-9) score on March 10, 2019 was 8.  During today's appointment, Sarah Burke was verbally administered a  questionnaire assessing various behaviors related to emotional eating. Sarah Burke endorsed the  following: find food is comforting to you and eat as a reward. She acknowledged skipping meals (e.g., breakfast). In addition, Sarah Burke denied a history of binge eating. Sarah Burke denied a history of restricting food intake, purging and engagement in other compensatory strategies, and has never been diagnosed with an eating disorder. She also denied a history of treatment for emotional eating. Furthermore, Sarah Burke shared about two recent losses, noting that impacted her eating habits resulting in Sarah Burke, Sarah Burke reportedly referring Sarah Burke to this provider.   Mental Status Examination:  Appearance: well groomed and appropriate hygiene  Behavior: appropriate to circumstances Mood: euthymic Affect: mood congruent Speech: normal in rate, volume, and tone Eye Contact: appropriate Psychomotor Activity: appropriate Gait: unable to assess Thought Process: linear, logical, and goal directed  Thought Content/Perception: denies suicidal and homicidal ideation, plan, and intent and no hallucinations, delusions, bizarre thinking or behavior reported or observed Orientation: time, person, place and purpose of appointment Memory/Concentration: memory, attention, language, and fund of knowledge intact  Insight/Judgment: good  Family & Psychosocial History: Sarah Burke reported she is not in a relationship and she has one daughter (age 68). She indicated she is currently not employed. Additionally, Sarah Burke shared her highest level of education obtained is a high school diploma and she attended cosmetology school. Currently, Sarah Burke's social support system consists of her mother and great friend. Moreover, Sarah Burke stated she resides with her maternal grandmother and daughter.   Medical History:  Past Medical History:  Diagnosis Date  . Anxiety   . Back pain   . Chest pain   . Constipation   . Edema, lower extremity   . GERD (gastroesophageal  reflux disease)   . High cholesterol   . History of PCOS   . Hypertension   . Infertility, female   . Prediabetes   . Shortness of breath    Past Surgical History:  Procedure Laterality Date  . NO PAST SURGERIES     Current Outpatient Medications on File Prior to Visit  Medication Sig Dispense Refill  . acetaminophen (TYLENOL) 500 MG tablet Take by mouth.    Marland Kitchen buPROPion (WELLBUTRIN XL) 150 MG 24 hr tablet TAKE 1 TABLET(150 MG) BY MOUTH DAILY 90 tablet 0  . metFORMIN (GLUCOPHAGE) 500 MG tablet Take 1 tablet (500 mg total) by mouth daily. 30 tablet 0  . traZODone (DESYREL) 50 MG tablet Take 0.5-1 tablets (25-50 mg total) by mouth at bedtime as needed for sleep. 30 tablet 0  . Vitamin D, Ergocalciferol, (DRISDOL) 1.25 MG (50000 UNIT) CAPS capsule Take 1 capsule (50,000 Units total) by mouth every 7 (seven) days. 4 capsule 0   No current facility-administered medications on file prior to visit.  Mirren denied a history of head injuries and loss of consciousness.    Mental Health History: Laketa reported there is no history of therapeutic and psychiatric services. Jeilani reported there is no history of hospitalizations for psychiatric concerns. Hema stated her PCP prescribes Wellbutrin and Trazodone. She stated she is medication compliant. Arynn denied a family history of mental health related concerns. Kaly reported there is no history of trauma including psychological, physical  and sexual abuse, as well as neglect.   Danique described her typical mood lately as "kind of chill." Zakiyah endorsed consuming 1-2 standard pours of wine "two or three times a month." She denied tobacco use. She denied illicit/recreational substance use. Regarding caffeine intake, Jonaya reported consuming 2-3 glasses of sugar free tea, noting it is not daily. Furthermore, Aleera indicated she is not  experiencing the following: hallucinations and delusions, paranoia, symptoms of mania , social withdrawal, crying spells, panic attacks  and decreased motivation. She also denied history of and current suicidal ideation, plan, and intent; history of and current homicidal ideation, plan, and intent; and history of and current engagement in self-harm.  The following strengths were reported by [name]: soft heart, people person, and caring. The following strengths were observed by this provider: ability to express thoughts and feelings during the therapeutic session, ability to establish and benefit from a therapeutic relationship, willingness to work toward established goal(s) with the clinic and ability to engage in reciprocal conversation.  Legal History: Danetra reported there is no history of legal involvement.   Structured Assessments Results: The Patient Health Questionnaire-9 (PHQ-9) is a self-report measure that assesses symptoms and severity of depression over the course of the last two weeks. Tekela obtained a score of 3 suggesting minimal depression. Caitriona finds the endorsed symptoms to be not difficult at all. [0= Not at all; 1= Several days; 2= More than half the days; 3= Nearly every day] Little interest or pleasure in doing things 0  Feeling down, depressed, or hopeless 0  Trouble falling or staying asleep, or sleeping too much 1  Feeling tired or having little energy 0  Poor appetite or overeating 2  Feeling bad about yourself --- or that you are a failure or have let yourself or your family down 0  Trouble concentrating on things, such as reading the newspaper or watching television 0  Moving or speaking so slowly that other people could have noticed? Or the opposite --- being so fidgety or restless that you have been moving around a lot more than usual 0  Thoughts that you would be better off dead or hurting yourself in some way 0  PHQ-9 Score 3    The Generalized Anxiety Disorder-7 (GAD-7) is a brief self-report measure that assesses symptoms of anxiety over the course of the last two weeks. Ronell obtained a score of 0. [0=  Not at all; 1= Several days; 2= Over half the days; 3= Nearly every day] Feeling nervous, anxious, on edge 0  Not being able to stop or control worrying 0  Worrying too much about different things 0  Trouble relaxing 0  Being so restless that it's hard to sit still 0  Becoming easily annoyed or irritable 0  Feeling afraid as if something awful might happen 0  GAD-7 Score 0   Interventions:  Conducted a chart review Focused on rapport building Verbally administered PHQ-9 and GAD-7 for symptom monitoring Verbally administered Food & Mood questionnaire to assess various behaviors related to emotional eating Provided emphatic reflections and validation Collaborated with patient on a treatment goal  Psychoeducation provided regarding physical versus emotional hunger  Provisional DSM-5 Diagnosis(es): 311 (F32.8) Other Specified Depressive Disorder, Emotional Eating Behaviors  Plan: Shaela appears able and willing to participate as evidenced by collaboration on a treatment goal, engagement in reciprocal conversation, and asking questions as needed for clarification. The next appointment will be scheduled in two weeks, which will be via MyChart Video Visit. The following treatment goal was established: increase coping skills.This provider will regularly review the treatment plan and medical chart to keep informed of status changes. Haeleigh expressed understanding and agreement with the initial treatment plan of care. Ardenia will be sent a handout via e-mail to utilize between now and the next appointment to increase awareness of hunger patterns and subsequent eating. Havannah provided verbal consent during  today's appointment for this provider to send the handout via e-mail.

## 2019-06-23 ENCOUNTER — Telehealth (INDEPENDENT_AMBULATORY_CARE_PROVIDER_SITE_OTHER): Payer: Medicaid Other | Admitting: Psychology

## 2019-06-28 ENCOUNTER — Telehealth (INDEPENDENT_AMBULATORY_CARE_PROVIDER_SITE_OTHER): Payer: Medicaid Other | Admitting: Psychology

## 2019-06-28 ENCOUNTER — Other Ambulatory Visit: Payer: Self-pay

## 2019-06-28 DIAGNOSIS — F3289 Other specified depressive episodes: Secondary | ICD-10-CM

## 2019-06-28 NOTE — Progress Notes (Signed)
  Office: [phone]  /  Fax: [phone]    Date: July 12, 2019   Appointment Start Time: 9:37am Duration: 21 minutes Provider: Lawerance Cruel, Psy.D. Type of Session: Individual Therapy  Location of Patient: Private location at the [facility] in [place], [place] Location of Provider: Provider's Home Type of Contact: Telepsychological Visit via Telephone  Session Content: This provider called Sarah Burke at 9:32am as she did not present for the telepsychological appointment. A HIPAA compliant voicemail was left requesting a call back. She was observed joining via [employer] Visit after the grace period; however, this provider agreed to continue for a shorter appointment with Sarah Burke's verbal consent. Additionally, due to connection issues via MyChart Video Visit, Sarah Burke provided verbal consent to proceed via telephone call. As such, today's appointment was initiated 7 minutes late. Sarah Burke is a 39 y.o. female presenting via telephone for a follow-up appointment to address the previously established treatment goal of increasing coping skills. Today's appointment was a telepsychological visit due to COVID-19. Sarah Burke provided verbal consent for today's telepsychological appointment and she is aware she is responsible for securing confidentiality on her end of the session. Prior to proceeding with today's appointment, Sarah Burke's physical location at the time of this appointment was obtained as well a phone number she could be reached at in the event of technical difficulties. Sarah Burke and this provider participated in today's telepsychological service.   This provider conducted a brief check-in and she shared about recent events. Sarah Burke also reported an increase in hydration; however, acknowledged a decreased appetite due to being sick. Emotional and physical hunger were reviewed. Sarah Burke acknowledged she is often "not consistent" with eating. As such, psychoeducation regarding the consequences of not eating regularly was  provided. Additionally, she was receptive to setting alarms on her phone as reminders to eat small meals/snacks regularly. Possible obstacles/barriers were processed. Sarah Burke was receptive to today's appointment as evidenced by openness to sharing, responsiveness to feedback, and willingness to implement discussed strategies.  Mental Status Examination:  Appearance: unable to assess  Behavior: unable to assess Mood: euthymic Affect: unable to fully assess Speech: normal in rate, volume, and tone Eye Contact: unable to assess Psychomotor Activity: unable to assess  Gait: unable to assess Thought Process: linear, logical, and goal directed  Thought Content/Perception: no hallucinations, delusions, bizarre thinking or behavior reported or observed and no evidence of suicidal and homicidal ideation, plan, and intent Orientation: time, person, place and purpose of appointment Memory/Concentration: memory, attention, language, and fund of knowledge intact  Insight/Judgment: good  Interventions:  Conducted a brief chart review Provided empathic reflections and validation Employed supportive psychotherapy interventions to facilitate reduced distress and to improve coping skills with identified stressors Employed motivational interviewing skills to assess patient's willingness/desire to adhere to recommended medical treatments and assignments  Engaged patient in problem solving  DSM-5 Diagnosis(es): 311 (F32.8) Other Specified Depressive Disorder, Emotional Eating Behaviors  Treatment Goal & Progress: During the initial appointment with this provider, the following treatment goal was established: increase coping skills. Progress is limited, as Sarah Burke has just begun treatment with this provider; however, she is receptive to the interaction and interventions and rapport is being established.   Plan: The next appointment will be scheduled in approximately two weeks, which will be via MyChart Video Visit.  The next session will focus on working towards the established treatment goal.

## 2019-07-12 ENCOUNTER — Other Ambulatory Visit: Payer: Self-pay

## 2019-07-12 ENCOUNTER — Telehealth (INDEPENDENT_AMBULATORY_CARE_PROVIDER_SITE_OTHER): Payer: Self-pay | Admitting: Psychology

## 2019-07-12 ENCOUNTER — Telehealth (INDEPENDENT_AMBULATORY_CARE_PROVIDER_SITE_OTHER): Payer: Medicaid Other | Admitting: Psychology

## 2019-07-12 DIAGNOSIS — F3289 Other specified depressive episodes: Secondary | ICD-10-CM

## 2019-07-12 NOTE — Telephone Encounter (Signed)
°  Office: [phone]  /  Fax: [phone]  Date of Call: July 12, 2019  Time of Call: 9:32am Provider: Lawerance Cruel, PsyD  CONTENT: This provider called Francina to check-in as she did not present for today's MyChart Video Visit appointment at 9:30am. A HIPAA compliant voicemail was left requesting a call back. Of note, this provider stayed on the MyChart Video Visit appointment for 5 minutes prior to signing off per the clinic's grace period policy.    PLAN: This provider will wait for Libby to call back. No further follow-up planned by this provider.

## 2019-07-13 ENCOUNTER — Ambulatory Visit (INDEPENDENT_AMBULATORY_CARE_PROVIDER_SITE_OTHER): Payer: Medicaid Other | Admitting: Family Medicine

## 2019-07-14 ENCOUNTER — Ambulatory Visit: Payer: Medicaid Other | Admitting: Family Medicine

## 2019-07-14 NOTE — Progress Notes (Signed)
Office: [phone]  /  Fax: [phone]    Date: July 28, 2019   Appointment Start Time: 8:34am Duration: 21 minutes Provider: Lawerance Cruel, Psy.D. Type of Session: Individual Therapy  Location of Patient: Home Location of Provider: Provider's Home Type of Contact: Telepsychological Visit via MyChart Video Visit  Session Content:This provider called Sarah Burke at 8:32am as she did not present for the telepsychological appointment. She indicated she thought the appointment was at 9am, but noted she would be able to join. As such, today's appointment was initiated 4 minutes late. Sarah Burke is a 39 y.o. female presenting via MyChart Video Visit for a follow-up appointment to address the previously established treatment goal of increasing coping skills. Today's appointment was a telepsychological visit due to COVID-19. Sarah Burke provided verbal consent for today's telepsychological appointment and she is aware she is responsible for securing confidentiality on her end of the session. Prior to proceeding with today's appointment, Sarah Burke's physical location at the time of this appointment was obtained as well a phone number she could be reached at in the event of technical difficulties. Sarah Burke and this provider participated in today's telepsychological service.   This provider conducted a brief check-in. Sarah Burke reported a recent loss, noting the funeral is today. She also discussed other ongoing stressors/events. This provider explored the impact of the aforementioned on well-being and eating habits. She noted she is "okay," but acknowledged challenges with eating. She stated she did not try to set alarms as previously discussed, adding she would eat when possible. Due to be sick, she explained she has not scheduled her in-person follow-up appointment with the clinic.  Psychoeducation regarding mindfulness was provided. A handout was provided to Sarah Burke with further information regarding mindfulness, including exercises.  This provider also explained the benefit of mindfulness as it relates to emotional eating. Modine was encouraged to engage in the provided exercises between now and the next appointment with this provider. Sarah Burke agreed. During today's appointment, Sarah Burke was led through a mindfulness exercise involving her senses. Sarah Burke provided verbal consent during today's appointment for this provider to send a handout about mindfulness via e-mail. Sarah Burke was receptive to today's appointment as evidenced by openness to sharing, responsiveness to feedback, and willingness to engage in mindfulness exercises to assist with coping.  Mental Status Examination:  Appearance: well groomed and appropriate hygiene  Behavior: appropriate to circumstances Mood: euthymic Affect: mood congruent Speech: normal in rate, volume, and tone Eye Contact: appropriate Psychomotor Activity: appropriate Gait: unable to assess Thought Process: linear, logical, and goal directed  Thought Content/Perception: no hallucinations, delusions, bizarre thinking or behavior reported or observed and no evidence of suicidal and homicidal ideation, plan, and intent Orientation: time, person, place, and purpose of appointment Memory/Concentration: memory, attention, language, and fund of knowledge intact  Insight/Judgment: good  Interventions:  Conducted a brief chart review Provided empathic reflections and validation Reviewed content from the previous session Employed supportive psychotherapy interventions to facilitate reduced distress and to improve coping skills with identified stressors Employed motivational interviewing skills to assess patient's willingness/desire to adhere to recommended medical treatments and assignments Psychoeducation provided regarding mindfulness Engaged patient in mindfulness exercise(s) Employed acceptance and commitment interventions to emphasize mindfulness and acceptance without struggle  DSM-5 Diagnosis(es): 311  (F32.8) Other Specified Depressive Disorder, Emotional Eating Behaviors  Treatment Goal & Progress: During the initial appointment with this provider, the following treatment goal was established: increase coping skills. Sarah Burke has demonstrated progress in her goal as evidenced by increased awareness of hunger patterns and increased  awareness of triggers for emotional eating. Sarah Burke also continues to demonstrate willingness to engage in learned skill(s).  Plan: Sarah Burke reported she is traveling to [place] tomorrow and is unsure when she will be back; therefore, she noted a plan to call the clinic to schedule a follow-up appointment once she has more details about her trip. The next session will focus on working towards the established treatment goal.

## 2019-07-16 ENCOUNTER — Other Ambulatory Visit (INDEPENDENT_AMBULATORY_CARE_PROVIDER_SITE_OTHER): Payer: Self-pay | Admitting: Physician Assistant

## 2019-07-16 DIAGNOSIS — E559 Vitamin D deficiency, unspecified: Secondary | ICD-10-CM

## 2019-07-28 ENCOUNTER — Other Ambulatory Visit: Payer: Self-pay

## 2019-07-28 ENCOUNTER — Telehealth (INDEPENDENT_AMBULATORY_CARE_PROVIDER_SITE_OTHER): Payer: Medicaid Other | Admitting: Psychology

## 2019-07-28 DIAGNOSIS — F3289 Other specified depressive episodes: Secondary | ICD-10-CM

## 2019-08-11 DIAGNOSIS — Z419 Encounter for procedure for purposes other than remedying health state, unspecified: Secondary | ICD-10-CM | POA: Diagnosis not present

## 2020-03-13 DIAGNOSIS — Z419 Encounter for procedure for purposes other than remedying health state, unspecified: Secondary | ICD-10-CM | POA: Diagnosis not present

## 2020-04-10 DIAGNOSIS — Z419 Encounter for procedure for purposes other than remedying health state, unspecified: Secondary | ICD-10-CM | POA: Diagnosis not present

## 2020-04-30 DIAGNOSIS — N760 Acute vaginitis: Secondary | ICD-10-CM | POA: Diagnosis not present

## 2020-04-30 DIAGNOSIS — Z7251 High risk heterosexual behavior: Secondary | ICD-10-CM | POA: Diagnosis not present

## 2020-05-09 ENCOUNTER — Encounter: Payer: Self-pay | Admitting: Obstetrics and Gynecology

## 2020-05-09 ENCOUNTER — Other Ambulatory Visit (HOSPITAL_COMMUNITY)
Admission: RE | Admit: 2020-05-09 | Discharge: 2020-05-09 | Disposition: A | Discharge status: Home / Self Care | Payer: Medicaid Other | Source: Ambulatory Visit | Attending: Obstetrics and Gynecology | Admitting: Obstetrics and Gynecology

## 2020-05-09 ENCOUNTER — Other Ambulatory Visit: Payer: Self-pay | Admitting: Obstetrics and Gynecology

## 2020-05-09 ENCOUNTER — Other Ambulatory Visit: Payer: Self-pay

## 2020-05-09 ENCOUNTER — Ambulatory Visit (INDEPENDENT_AMBULATORY_CARE_PROVIDER_SITE_OTHER): Payer: Medicaid Other | Admitting: Obstetrics and Gynecology

## 2020-05-09 VITALS — BP 143/84 | Ht 62.0 in | Wt 244.0 lb

## 2020-05-09 DIAGNOSIS — Z01419 Encounter for gynecological examination (general) (routine) without abnormal findings: Secondary | ICD-10-CM

## 2020-05-09 DIAGNOSIS — Z1331 Encounter for screening for depression: Secondary | ICD-10-CM | POA: Diagnosis not present

## 2020-05-09 DIAGNOSIS — Z1339 Encounter for screening examination for other mental health and behavioral disorders: Secondary | ICD-10-CM

## 2020-05-09 DIAGNOSIS — Z113 Encounter for screening for infections with a predominantly sexual mode of transmission: Secondary | ICD-10-CM | POA: Insufficient documentation

## 2020-05-09 NOTE — Progress Notes (Signed)
Gynecology Annual Exam  PCP: Doren Custard, FNP  Chief Complaint  Patient presents with  . Annual Exam    History of Present Illness:  Ms. Sarah Burke is a 40 y.o. G2P1011 who LMP was Patient's last menstrual period was 05/07/2020., presents today for her annual examination.  Her menses are regular since her miscarriage.  They were irregular prior to that pregnancy.  They last about 4-5 days. They are painful right before the period starts.  She describes this as lower abdominal pain.  She states that she gets emotional just before.   She is not sexually active recently. She is not currently contracepting.  Last Pap: 01/2018  Results were: no abnormalities /neg HPV DNA negative Hx of STDs: trichomonas, HSV.  Trichomonas was recently diagnosed and she has not been treated so far.   There is a FH of breast cancer in her maternal grandmother and two maternal aunts. She is unsure about genetic testing in her family.  There is no FH of ovarian cancer. The patient does do self-breast exams.  Tobacco use: The patient denies current or previous tobacco use. Alcohol use: social drinker Exercise: walks regularly  She was pregnant between June through October last year. She had a miscarriage where she had retained products and had to undergo a D&C. She required a blood transfusion.   She also states that she had BV and trichomonas.    The patient wears seatbelts: yes.   The patient reports that domestic violence in her life is absent.   Past Medical History:  Diagnosis Date  . Anxiety   . Back pain   . Chest pain   . Constipation   . Edema, lower extremity   . GERD (gastroesophageal reflux disease)   . High cholesterol   . History of PCOS   . Hypertension   . Infertility, female   . Prediabetes   . Shortness of breath     Past Surgical History:  Procedure Laterality Date  . DILATION AND CURETTAGE OF UTERUS      Prior to Admission medications   Medication Sig Start Date End  Date Taking? Authorizing Provider  acetaminophen (TYLENOL) 500 MG tablet Take by mouth.    [provider]  buPROPion (WELLBUTRIN XL) 150 MG 24 hr tablet TAKE 1 TABLET(150 MG) BY MOUTH DAILY 06/15/19   Doren Custard, FNP  metFORMIN (GLUCOPHAGE) 500 MG tablet Take 1 tablet (500 mg total) by mouth daily. 04/13/19   Helane Rima, DO  traZODone (DESYREL) 50 MG tablet Take 0.5-1 tablets (25-50 mg total) by mouth at bedtime as needed for sleep. 05/23/19   Helane Rima, DO  Vitamin D, Ergocalciferol, (DRISDOL) 1.25 MG (50000 UNIT) CAPS capsule Take 1 capsule (50,000 Units total) by mouth every 7 (seven) days. 06/20/19   Alois Cliche, PA-C   Allergies: No Known Allergies  Obstetric History: G2P1011  Social History   Socioeconomic History  . Marital status: Single    Spouse name: Not on file  . Number of children: 1  . Years of education: Not on file  . Highest education level: Associate degree: occupational, Scientist, product/process development, or vocational program  Occupational History  . Occupation: homemaker  Tobacco Use  . Smoking status: Never Smoker  . Smokeless tobacco: Never Used  Vaping Use  . Vaping Use: Never used  Substance and Sexual Activity  . Alcohol use: Yes    Comment: occassionally  . Drug use: Never  . Sexual activity: Not Currently  Partners: Male    Birth control/protection: None  Other Topics Concern  . Not on file  Social History Narrative   39yo daugher - Arts administrator    Social Determinants of Health   Financial Resource Strain: Not on file  Food Insecurity: Not on file  Transportation Needs: Not on file  Physical Activity: Not on file  Stress: Not on file  Social Connections: Not on file  Intimate Partner Violence: Not on file    Family History  Problem Relation Age of Onset  . Hypertension Mother   . Obesity Mother   . Diabetes Brother   . Hypertension Father   . Hyperlipidemia Father   . Heart disease Father   . Hypertension Maternal Grandmother   .  Hypercholesterolemia Maternal Grandmother   . Breast cancer Maternal Grandmother   . Breast cancer Maternal Aunt   . Breast cancer Maternal Aunt     Review of Systems  Constitutional: Negative.   HENT: Negative.   Eyes: Negative.   Respiratory: Negative.   Cardiovascular: Negative.   Gastrointestinal: Negative.   Genitourinary: Negative.   Musculoskeletal: Negative.   Skin: Negative.   Neurological: Negative.   Psychiatric/Behavioral: Negative.      Physical Exam BP (!) 143/84   Ht 5\' 2"  (1.575 m)   Wt 244 lb (110.7 kg)   LMP 05/07/2020   BMI 44.63 kg/m    Physical Exam Constitutional:      General: She is not in acute distress.    Appearance: Normal appearance. She is well-developed.  Genitourinary:     Vulva and bladder normal.     Right Labia: No rash, tenderness, lesions, skin changes or Bartholin's cyst.    Left Labia: No tenderness, lesions, skin changes, Bartholin's cyst or rash.    No inguinal adenopathy present in the right or left side.    Pelvic Tanner Score: 5/5.    No vaginal discharge, erythema, tenderness or bleeding.      Right Adnexa: not tender, not full and no mass present.    Left Adnexa: not tender, not full and no mass present.    No cervical motion tenderness, discharge, lesion or polyp.     Uterus is not enlarged or tender.     No uterine mass detected.    Pelvic exam was performed with patient in the lithotomy position.  Breasts:     Right: No inverted nipple, mass, nipple discharge, skin change or tenderness.     Left: No inverted nipple, mass, nipple discharge, skin change or tenderness.    HENT:     Head: Normocephalic and atraumatic.  Eyes:     General: No scleral icterus.    Conjunctiva/sclera: Conjunctivae normal.  Neck:     Thyroid: No thyromegaly.  Cardiovascular:     Rate and Rhythm: Normal rate and regular rhythm.     Heart sounds: No murmur heard. No friction rub. No gallop.   Pulmonary:     Effort: Pulmonary effort is  normal. No respiratory distress.     Breath sounds: Normal breath sounds. No wheezing or rales.  Abdominal:     General: Bowel sounds are normal. There is no distension.     Palpations: Abdomen is soft. There is no mass.     Tenderness: There is no abdominal tenderness. There is no guarding or rebound.     Hernia: There is no hernia in the left inguinal area or right inguinal area.  Musculoskeletal:        General: No  swelling or tenderness. Normal range of motion.     Cervical back: Normal range of motion and neck supple.  Lymphadenopathy:     Cervical: No cervical adenopathy.     Lower Body: No right inguinal adenopathy. No left inguinal adenopathy.  Neurological:     General: No focal deficit present.     Mental Status: She is alert and oriented to person, place, and time.     Cranial Nerves: No cranial nerve deficit.  Skin:    General: Skin is warm and dry.     Findings: No erythema or rash.  Psychiatric:        Mood and Affect: Mood normal.        Behavior: Behavior normal.        Judgment: Judgment normal.     Female chaperone present for pelvic and breast  portions of the physical exam  Results: AUDIT Questionnaire (screen for alcoholism): 1 PHQ-9: 1   Assessment: 40 y.o. G9P1011 female here for routine annual gynecologic examination  Plan: Problem List Items Addressed This Visit   None   Visit Diagnoses    Women's annual routine gynecological examination    -  Primary   Relevant Orders   Cervicovaginal ancillary only   STD Panel   Screening for depression       Screening for alcoholism       Screen for STD (sexually transmitted disease)       Relevant Orders   Cervicovaginal ancillary only   STD Panel      Screening: -- Blood pressure screen elevated: continued to monitor. -- Weight screening: obese: discussed management options, including lifestyle, dietary, and exercise. -- Depression screening negative (PHQ-9) -- Nutrition: normal -- cholesterol  screening: not due for screening -- osteoporosis screening: not due -- tobacco screening: not using -- alcohol screening: AUDIT questionnaire indicates low-risk usage. -- family history of breast cancer screening: done. not at high risk. -- no evidence of domestic violence or intimate partner violence. -- STD screening: gonorrhea/chlamydia NAAT collected -- pap smear not collected per ASCCP guidelines  Thomasene Mohair, MD 05/09/2020 3:22 PM

## 2020-05-10 LAB — RPR+HSVIGM+HBSAG+HSV2(IGG)+...
HIV Screen 4th Generation wRfx: NONREACTIVE
HSV 2 IgG, Type Spec: 9.48 index — ABNORMAL HIGH (ref 0.00–0.90)
HSVI/II Comb IgM: <0.91 Ratio (ref 0.00–0.90)
Hepatitis B Surface Ag: NEGATIVE
RPR Ser Ql: NONREACTIVE

## 2020-05-11 DIAGNOSIS — Z419 Encounter for procedure for purposes other than remedying health state, unspecified: Secondary | ICD-10-CM | POA: Diagnosis not present

## 2020-05-16 LAB — MOLECULAR ANCILLARY ONLY
Chlamydia: NEGATIVE
Comment: NEGATIVE
Comment: NEGATIVE
Comment: NORMAL
Neisseria Gonorrhea: NEGATIVE
Trichomonas: NEGATIVE

## 2020-06-10 DIAGNOSIS — Z419 Encounter for procedure for purposes other than remedying health state, unspecified: Secondary | ICD-10-CM | POA: Diagnosis not present

## 2020-07-11 DIAGNOSIS — Z419 Encounter for procedure for purposes other than remedying health state, unspecified: Secondary | ICD-10-CM | POA: Diagnosis not present

## 2020-08-10 DIAGNOSIS — Z419 Encounter for procedure for purposes other than remedying health state, unspecified: Secondary | ICD-10-CM | POA: Diagnosis not present

## 2020-09-10 DIAGNOSIS — Z419 Encounter for procedure for purposes other than remedying health state, unspecified: Secondary | ICD-10-CM | POA: Diagnosis not present

## 2020-10-11 DIAGNOSIS — Z419 Encounter for procedure for purposes other than remedying health state, unspecified: Secondary | ICD-10-CM | POA: Diagnosis not present

## 2020-10-16 ENCOUNTER — Encounter: Payer: Self-pay | Admitting: Obstetrics and Gynecology

## 2020-10-16 ENCOUNTER — Ambulatory Visit (INDEPENDENT_AMBULATORY_CARE_PROVIDER_SITE_OTHER): Payer: Medicaid Other | Admitting: Obstetrics and Gynecology

## 2020-10-16 ENCOUNTER — Other Ambulatory Visit: Payer: Self-pay

## 2020-10-16 ENCOUNTER — Other Ambulatory Visit (HOSPITAL_COMMUNITY)
Admission: RE | Admit: 2020-10-16 | Discharge: 2020-10-16 | Disposition: A | Discharge status: Home / Self Care | Payer: Medicaid Other | Source: Ambulatory Visit | Attending: Obstetrics and Gynecology | Admitting: Obstetrics and Gynecology

## 2020-10-16 VITALS — BP 140/90 | Wt 248.6 lb

## 2020-10-16 DIAGNOSIS — Z124 Encounter for screening for malignant neoplasm of cervix: Secondary | ICD-10-CM | POA: Diagnosis not present

## 2020-10-16 DIAGNOSIS — Z1379 Encounter for other screening for genetic and chromosomal anomalies: Secondary | ICD-10-CM

## 2020-10-16 DIAGNOSIS — O10919 Unspecified pre-existing hypertension complicating pregnancy, unspecified trimester: Secondary | ICD-10-CM | POA: Diagnosis not present

## 2020-10-16 DIAGNOSIS — N926 Irregular menstruation, unspecified: Secondary | ICD-10-CM | POA: Diagnosis not present

## 2020-10-16 DIAGNOSIS — O0990 Supervision of high risk pregnancy, unspecified, unspecified trimester: Secondary | ICD-10-CM | POA: Insufficient documentation

## 2020-10-16 DIAGNOSIS — Z20828 Contact with and (suspected) exposure to other viral communicable diseases: Secondary | ICD-10-CM

## 2020-10-16 DIAGNOSIS — O09529 Supervision of elderly multigravida, unspecified trimester: Secondary | ICD-10-CM

## 2020-10-16 DIAGNOSIS — O09891 Supervision of other high risk pregnancies, first trimester: Secondary | ICD-10-CM

## 2020-10-16 DIAGNOSIS — R7303 Prediabetes: Secondary | ICD-10-CM

## 2020-10-16 DIAGNOSIS — Z1371 Encounter for nonprocreative screening for genetic disease carrier status: Secondary | ICD-10-CM

## 2020-10-16 LAB — POCT URINALYSIS DIPSTICK OB: Glucose, UA: NEGATIVE

## 2020-10-16 LAB — POCT URINE PREGNANCY: Preg Test, Ur: POSITIVE — AB

## 2020-10-16 MED ORDER — NIFEDIPINE ER OSMOTIC RELEASE 30 MG PO TB24: 30.0000 mg | ORAL_TABLET | Freq: Every day | ORAL | 2 refills | Status: DC

## 2020-10-16 NOTE — Progress Notes (Signed)
10/16/2020   Chief Complaint: Missed period  Transfer of Care Patient: no  History of Present Illness: Ms. Sarah Burke is a 40 y.o. G2P1011 Unknown based on Patient's last menstrual period was 08/08/2020. with an Estimated Date of Delivery: None noted., with the above CC.   Her periods were: irregular  She was using no method when she conceived.  She has Positive signs or symptoms of nausea/vomiting of pregnancy. She has Negative signs or symptoms of miscarriage or preterm labor She was not taking different medications around the time she conceived/early pregnancy. Since her LMP, she has not used alcohol Since her LMP, she has not used tobacco products Since her LMP, she has not used illegal drugs.    Current or past history of domestic violence. no  Infection History:  1. Since her LMP, she has not had a viral illness.  2. She reports close contact with children on a regular basis     3. She has a history of chicken pox. She reports vaccination for chicken pox in the past. 4. Patient or partner has history of genital herpes  maybe 5. History of STI (GC, CT, HPV, syphilis, HIV)  history or trichomonas 6.  She does not live with someone with TB or TB exposed. 7. History of recent travel :  no 8. She identifies Negative Zika risk factors for her and her partner 18. There are not cats in the home in the home.  She understands that while pregnant she should not change cat litter.   Genetic Screening Questions: (Includes patient, baby's father, or anyone in either family)   1. Patient's age >/= 69 at [facility]  yes 2. Thalassemia ([place], [place], Mediterranean, or Asian background): MCV<80  no 3. Neural tube defect (meningomyelocele, spina bifida, anencephaly)  no 4. Congenital heart defect  no  5. Down syndrome  no 6. Tay-Sachs (Jewish, [place]  no 7. Canavan's Disease  no 8. Sickle cell disease or trait (African)  no  9. Hemophilia or other blood disorders  no  10. Muscular  dystrophy  no  11. Cystic fibrosis  no  12. Huntington's Chorea  no  13. Mental retardation/autism  no 14. Other inherited genetic or chromosomal disorder  no 15. Maternal metabolic disorder (DM, PKU, etc)  no 16. Patient or FOB with a child with a birth defect not listed above no  16a. Patient or FOB with a birth defect themselves no 17. Recurrent pregnancy loss, or stillbirth  yes, reports a recent 20 week loss las year  22. Any medications since LMP other than prenatal vitamins (include vitamins, supplements, OTC meds, drugs, alcohol)  no 19. Any other genetic/environmental exposure to discuss  no  ROS:  Review of Systems  Constitutional:  Negative for chills, fever, malaise/fatigue and weight loss.  HENT:  Negative for congestion, hearing loss and sinus pain.   Eyes:  Negative for blurred vision and double vision.  Respiratory:  Negative for cough, sputum production, shortness of breath and wheezing.   Cardiovascular:  Negative for chest pain, palpitations, orthopnea and leg swelling.  Gastrointestinal:  Negative for abdominal pain, constipation, diarrhea, nausea and vomiting.  Genitourinary:  Negative for dysuria, flank pain, frequency, hematuria and urgency.  Musculoskeletal:  Negative for back pain, falls and joint pain.  Skin:  Negative for itching and rash.  Neurological:  Negative for dizziness and headaches.  Psychiatric/Behavioral:  Negative for depression, substance abuse and suicidal ideas. The patient is not nervous/anxious.    OBGYN History: As per  HPI. OB History  Gravida Para Term Preterm AB Living  3 1 1   1 1   SAB IAB Ectopic Multiple Live Births  1            # Outcome Date GA Lbr Len/2nd Weight Sex Delivery Anes PTL Lv  3 Current           2 SAB           1 Term             Any issues with any prior pregnancies: yes Any prior children are healthy, doing well, without any problems or issues: not applicable Last pap smear 2019  History of STIs: Yes    Past Medical History: Past Medical History:  Diagnosis Date   Anxiety    Back pain    Chest pain    Constipation    Edema, lower extremity    GERD (gastroesophageal reflux disease)    High cholesterol    History of PCOS    Hypertension    Infertility, female    Prediabetes    Shortness of breath     Past Surgical History: Past Surgical History:  Procedure Laterality Date   DILATION AND CURETTAGE OF UTERUS      Family History:  Family History  Problem Relation Age of Onset   Hypertension Mother    Obesity Mother    Diabetes Brother    Hypertension Father    Hyperlipidemia Father    Heart disease Father    Hypertension Maternal Grandmother    Hypercholesterolemia Maternal Grandmother    Breast cancer Maternal Grandmother    Breast cancer Maternal Aunt    Breast cancer Maternal Aunt    She denies any female cancers, bleeding or blood clotting disorders.   Social History:  Social History   Socioeconomic History   Marital status: Single    Spouse name: Not on file   Number of children: 1   Years of education: Not on file   Highest education level: Associate degree: occupational, 2020, or vocational program  Occupational History   Occupation: homemaker  Tobacco Use   Smoking status: Never   Smokeless tobacco: Never  Vaping Use   Vaping Use: Never used  Substance and Sexual Activity   Alcohol use: Yes    Comment: occassionally   Drug use: Never   Sexual activity: Not Currently    Partners: Male    Birth control/protection: None  Other Topics Concern   Not on file  Social History Narrative   40yo daugher - Scientist, product/process development    Social Determinants of Health   Financial Resource Strain: Not on file  Food Insecurity: Not on file  Transportation Needs: Not on file  Physical Activity: Not on file  Stress: Not on file  Social Connections: Not on file  Intimate Partner Violence: Not on file    Allergy: No Known Allergies  Current Outpatient  Medications:  Current Outpatient Medications:    NIFEdipine (PROCARDIA-XL/NIFEDICAL-XL) 30 MG 24 hr tablet, Take 1 tablet (30 mg total) by mouth daily. Can increase to twice a day as needed for symptomatic contractions, Disp: 30 tablet, Rfl: 2   acetaminophen (TYLENOL) 500 MG tablet, Take by mouth., Disp: , Rfl:    buPROPion (WELLBUTRIN XL) 150 MG 24 hr tablet, TAKE 1 TABLET(150 MG) BY MOUTH DAILY, Disp: 90 tablet, Rfl: 0   metFORMIN (GLUCOPHAGE) 500 MG tablet, Take 1 tablet (500 mg total) by mouth daily., Disp: 30 tablet, Rfl: 0  traZODone (DESYREL) 50 MG tablet, Take 0.5-1 tablets (25-50 mg total) by mouth at bedtime as needed for sleep., Disp: 30 tablet, Rfl: 0   Vitamin D, Ergocalciferol, (DRISDOL) 1.25 MG (50000 UNIT) CAPS capsule, Take 1 capsule (50,000 Units total) by mouth every 7 (seven) days., Disp: 4 capsule, Rfl: 0   Physical Exam: Physical Exam Vitals and nursing note reviewed.  HENT:     Head: Normocephalic and atraumatic.  Eyes:     Pupils: Pupils are equal, round, and reactive to light.  Neck:     Thyroid: No thyromegaly.  Cardiovascular:     Rate and Rhythm: Normal rate and regular rhythm.  Pulmonary:     Effort: Pulmonary effort is normal.  Abdominal:     General: Bowel sounds are normal. There is no distension.     Palpations: Abdomen is soft.     Tenderness: There is no abdominal tenderness. There is no guarding or rebound.  Genitourinary:    Comments: External: Normal appearing vulva. No lesions noted.  Speculum examination: Normal appearing cervix. No blood in the vaginal vault. White normal discharge.  Bimanual examination: Uterus midline, non-tender, normal in size, shape and contour.  No CMT. No adnexal masses. No adnexal tenderness. Pelvis not fixed.  Musculoskeletal:        General: Normal range of motion.     Cervical back: Normal range of motion and neck supple.  Skin:    General: Skin is warm and dry.  Neurological:     Mental Status: She is alert  and oriented to person, place, and time.  Psychiatric:        Mood and Affect: Affect normal.        Judgment: Judgment normal.     Assessment: Ms. Sarah Burke is a 40 y.o. G2P1011 Unknown based on Patient's last menstrual period was 08/08/2020. with an Estimated Date of Delivery: None noted.,  for prenatal care.  Plan:  1) Avoid alcoholic beverages. 2) Patient encouraged not to smoke.  3) Discontinue the use of all non-medicinal drugs and chemicals.  4) Take prenatal vitamins daily.  5) Seatbelt use advised 6) Nutrition, food safety (fish, cheese advisories, and high nitrite foods) and exercise discussed. 7) Hospital and practice style delivering at [facility] discussed  8) Patient is asked about travel to areas at risk for the Zika virus, and counseled to avoid travel and exposure to mosquitoes or sexual partners who may have themselves been exposed to the virus. Testing is discussed, and will be ordered as appropriate.  9) Childbirth classes at [facility] advised 10) Genetic Screening, such as with 1st Trimester Screening, cell free fetal DNA, AFP testing, and Ultrasound, as well as with amniocentesis and CVS as appropriate, is discussed with patient. She plans to have genetic testing this pregnancy.   Dating bedside US showed IUP 10 weeks 0 days. + FHR 170 bpm.      CHTN- discussed initiation of BP medication.  Prescription for Procardia sent. Advised to start an 81 mg aspirin at [redacted] weeks gestation.  Encouraged covid vaccination Will check HSV serology-discussed if she does have a history of HSV suppression therapy at 36 weeks would be recommended.  History of prior preterm delivery.  Patient will qualify for cervical length screening starting at 16 weeks. Consider 17-P versus cerclage.   Genetic screening today. Pap smear today. Provided with samples of prenatal vitamins. Encouraged initiation.  History of prediabetes.  Patient reports that she stopped her metformin 2 years ago.  She has  a  history of poor tolerance of 1 hour glucose test with vomiting.  She prefers to do glucose testing with jellybeans.  Problem list reviewed and updated.  I discussed the assessment and treatment plan with the patient. The patient was provided an opportunity to ask questions and all were answered. The patient agreed with the plan and demonstrated an understanding of the instructions.  Adelene Idlerhristanna Marche Hottenstein MD [facility], [facility] 10/16/2020 1:42 PM

## 2020-10-16 NOTE — Patient Instructions (Signed)
First Trimester of Pregnancy  The first trimester of pregnancy starts on the first day of your last menstrual period until the end of week 12. This is months 1 through 3 of pregnancy. A week after a sperm fertilizes an egg, the egg will implant into the wall of the uterus and begin to develop into a baby. By the end of 12 weeks, all the baby'sorgans will be formed and the baby will be 2-3 inches in size. Body changes during your first trimester Your body goes through many changes during pregnancy. The changes vary andgenerally return to normal after your baby is born. Physical changes You may gain or lose weight. Your breasts may begin to grow larger and become tender. The tissue that surrounds your nipples (areola) may become darker. Dark spots or blotches (chloasma or mask of pregnancy) may develop on your face. You may have changes in your hair. These can include thickening or thinning of your hair or changes in texture. Health changes You may feel nauseous, and you may vomit. You may have heartburn. You may develop headaches. You may develop constipation. Your gums may bleed and may be sensitive to brushing and flossing. Other changes You may tire easily. You may urinate more often. Your menstrual periods will stop. You may have a loss of appetite. You may develop cravings for certain kinds of food. You may have changes in your emotions from day to day. You may have more vivid and strange dreams. Follow these instructions at home: Medicines Follow your health care provider's instructions regarding medicine use. Specific medicines may be either safe or unsafe to take during pregnancy. Do not take any medicines unless told to by your health care provider. Take a prenatal vitamin that contains at least 600 micrograms (mcg) of folic acid. Eating and drinking Eat a healthy diet that includes fresh fruits and vegetables, whole grains, good sources of protein such as meat, eggs, or tofu,  and low-fat dairy products. Avoid raw meat and unpasteurized juice, milk, and cheese. These carry germs that can harm you and your baby. If you feel nauseous or you vomit: Eat 4 or 5 small meals a day instead of 3 large meals. Try eating a few soda crackers. Drink liquids between meals instead of during meals. You may need to take these actions to prevent or treat constipation: Drink enough fluid to keep your urine pale yellow. Eat foods that are high in fiber, such as beans, whole grains, and fresh fruits and vegetables. Limit foods that are high in fat and processed sugars, such as fried or sweet foods. Activity Exercise only as directed by your health care provider. Most people can continue their usual exercise routine during pregnancy. Try to exercise for 30 minutes at least 5 days a week. Stop exercising if you develop pain or cramping in the lower abdomen or lower back. Avoid exercising if it is very hot or humid or if you are at high altitude. Avoid heavy lifting. If you choose to, you may have sex unless your health care provider tells you not to. Relieving pain and discomfort Wear a good support bra to relieve breast tenderness. Rest with your legs elevated if you have leg cramps or low back pain. If you develop bulging veins (varicose veins) in your legs: Wear support hose as told by your health care provider. Elevate your feet for 15 minutes, 3-4 times a day. Limit salt in your diet. Safety Wear your seat belt at all times when driving or   riding in a car. Talk with your health care provider if someone is verbally or physically abusive to you. Talk with your health care provider if you are feeling sad or have thoughts of hurting yourself. Lifestyle Do not use hot tubs, steam rooms, or saunas. Do not douche. Do not use tampons or scented sanitary pads. Do not use herbal remedies, alcohol, illegal drugs, or medicines that are not approved by your health care provider. Chemicals  in these products can harm your baby. Do not use any products that contain nicotine or tobacco, such as cigarettes, e-cigarettes, and chewing tobacco. If you need help quitting, ask your health care provider. Avoid cat litter boxes and soil used by cats. These carry germs that can cause birth defects in the baby and possibly loss of the unborn baby (fetus) by miscarriage or stillbirth. General instructions During routine prenatal visits in the first trimester, your health care provider will do a physical exam, perform necessary tests, and ask you how things are going. Keep all follow-up visits. This is important. Ask for help if you have counseling or nutritional needs during pregnancy. Your health care provider can offer advice or refer you to specialists for help with various needs. Schedule a dentist appointment. At home, brush your teeth with a soft toothbrush. Floss gently. Write down your questions. Take them to your prenatal visits. Where to find more information American Pregnancy Association: americanpregnancy.org American College of Obstetricians and Gynecologists: acog.org/en/Womens%20Health/Pregnancy Office on Women's Health: womenshealth.gov/pregnancy Contact a health care provider if you have: Dizziness. A fever. Mild pelvic cramps, pelvic pressure, or nagging pain in the abdominal area. Nausea, vomiting, or diarrhea that lasts for 24 hours or longer. A bad-smelling vaginal discharge. Pain when you urinate. Known exposure to a contagious illness, such as chickenpox, measles, Zika virus, HIV, or hepatitis. Get help right away if you have: Spotting or bleeding from your vagina. Severe abdominal cramping or pain. Shortness of breath or chest pain. Any kind of trauma, such as from a fall or a car crash. New or increased pain, swelling, or redness in an arm or leg. Summary The first trimester of pregnancy starts on the first day of your last menstrual period until the end of week  12 (months 1 through 3). Eating 4 or 5 small meals a day rather than 3 large meals may help to relieve nausea and vomiting. Do not use any products that contain nicotine or tobacco, such as cigarettes, e-cigarettes, and chewing tobacco. If you need help quitting, ask your health care provider. Keep all follow-up visits. This is important. This information is not intended to replace advice given to you by your health care provider. Make sure you discuss any questions you have with your healthcare provider. Document Revised: 07/06/2019 Document Reviewed: 05/12/2019 Elsevier Patient Education  2022 Elsevier Inc.  OVER THE COUNTER MEDICINES  SAFE IN PREGNANCY   COMPLAINT                                        MEDICINE                                                 Constipation Add fiber supplements such as Metamucil, Fibercon, or Citrucel.  Increasing fiber intake via bran cereals,   oatmeal, leafy greens, and prunes is another alternative.  Increase you fluid intake.  You may also add Colace 100mg by mouth twice daily or Senakot   Cough/Cold      Robitusin, Mucinex  Cuts and  Scrapes Bacitracin, Neosporin, Plysporin  Dental Pain     Tylenol. Avoid aspirin  products, which  could cause bleeding problems for mother and baby.  Also avoid ibuprofen products, such as Advil, Motrin or Aleve unless your doctor or nurse specifically recommends these drugs.  Diarrhea Immodium, Kaopectate, or Donnagel PG .  Ensure adequate hydration by taking in plenty of clear liquids such as Gatorade, ginger ale, and jello.  Call if symptoms persist over 24-hrs.  Do NOT take Pepto-Bismol  Fever Take Tylenol *, Extra-Strength Tylenol *, or any aspirin-free pain reliever (acetaminophen).  Avoid aspirin products, which could cause bleeding problems for mother and baby.  Also avoid ibuprofen products, such as Advil, Motrin or Aleve unless your doctor or nurse specifically recommends these  drugs.  If you temperature is over 101o F (38.3o C), call the  clinic  Gas    Gaviscon,, Mylicon,  Riopan    Headache   Tylenol. Avoid aspirin                                                                         products, which  could cause bleeding problems for mother and baby.  Also avoid ibuprofen products, such as Advil, Motrin or Aleve unless your doctor or nurse specifically recommends these drugs.  Head Lice     Nix  Heartburn/Indigestion TUMS, Rolaids, Zantac, Mylanta. Maalox   Avoid fatty, fried, or spicy foods.  Eat small frequent meals 5-6 times daily as opposed to 3 large meals a day.  Hemorrhoids Annusol HC, Tucks , Perperation H , or Dermaplast.  Warm sitz baths for 30 minutes twice daily.  Air dry and sleep without underwear.  Avoid constipation and see recommendation above for constipation as this may exacerbate/worsen you hemorrhoids  Leg Cramps     TUMS, Vitamin with Potassium  Leg Swelling  Elevate legs above                                                                                            waist, lay on your left side.  Well fitting  shoes and support  hose  Muscle Aches                Tylenol. Avoid aspirin products, which  could cause bleeding problems for mother and baby.  Also avoid ibuprofen products, such as Advil, Motrin or Aleve unless your doctor or nurse specifically recommends these drugs.  Nausea/Vomiting Emetrol.  Supportive measures to ensure you stay adequately hydrated.  Sips of juice, Gatorade, ginger ale 4 times an hour.  If you are unable to tolerate fluid intake for greater than 8hrs   call the clinic.  You may want to allow ginger ale to go flat as carbonation my precipitate emesis.  Over the counter chewable prenatal vitamins are available.  Vitamin B6 (pyridoxine) 10 to 24mg every 8hrs and doxylamine (Unisome sleep tabs) 25mg at bedtime and 12.5mg in the morning is first line.  Note that B6 supplements are  not FDA regulated, there is a prescription of the above combination commercially available called Bonjesta.  Nosebleeds Apply ice pack to nose.  Particularly in winter time with dryer air, consider the use of a humidifier   Painful urination    Call clinic  Rash/ Itch Benadryl, Aveeno, Cortaid.  For severe whole body itching in the late second early third trimester call the office  Sleep Problems    Benadryl, Tylenol  PM, or Unisom  Seasonal Allergies    Claritin, Zyrtec,          Benadryl, Mucinex  Sinus Congestion Tylenol with Sudafed (pseudoephedrine no phenylephrine) or Actifed.  Sore Throat Chloroseptic lozenges and sprays such as Cepacol.  Salt water gargles (1 teaspoon of table salt in one quart of water)  Call if associated temperature above 101 Fahrenheit or 38.3 Celsius  Yeast Infection     Monistat    

## 2020-10-17 ENCOUNTER — Other Ambulatory Visit: Payer: Self-pay | Admitting: Obstetrics and Gynecology

## 2020-10-17 DIAGNOSIS — Z3689 Encounter for other specified antenatal screening: Secondary | ICD-10-CM

## 2020-10-17 LAB — PROTEIN / CREATININE RATIO, URINE
Creatinine, Urine: 192.4 mg/dL
Protein, Ur: 35.1 mg/dL
Protein/Creat Ratio: 182 mg/g creat (ref 0–200)

## 2020-10-18 LAB — COMPREHENSIVE METABOLIC PANEL
ALT: 8 IU/L (ref 0–32)
AST: 10 IU/L (ref 0–40)
Albumin/Globulin Ratio: 1.6 (ref 1.2–2.2)
Albumin: 4.5 g/dL (ref 3.8–4.8)
Alkaline Phosphatase: 57 IU/L (ref 44–121)
BUN/Creatinine Ratio: 9 (ref 9–23)
BUN: 6 mg/dL (ref 6–20)
Bilirubin Total: 0.2 mg/dL (ref 0.0–1.2)
CO2: 21 mmol/L (ref 20–29)
Calcium: 9.5 mg/dL (ref 8.7–10.2)
Chloride: 101 mmol/L (ref 96–106)
Creatinine, Ser: 0.69 mg/dL (ref 0.57–1.00)
Globulin, Total: 2.9 g/dL (ref 1.5–4.5)
Glucose: 92 mg/dL (ref 65–99)
Potassium: 4.3 mmol/L (ref 3.5–5.2)
Sodium: 133 mmol/L — ABNORMAL LOW (ref 134–144)
Total Protein: 7.4 g/dL (ref 6.0–8.5)
eGFR: 114 mL/min/{1.73_m2} (ref >59)

## 2020-10-18 LAB — HSV(HERPES SMPLX)ABS-I+II(IGG+IGM)-BLD
HSV 1 Glycoprotein G Ab, IgG: <0.91 index (ref 0.00–0.90)
HSV 2 IgG, Type Spec: 10.4 index — ABNORMAL HIGH (ref 0.00–0.90)
HSVI/II Comb IgM: <0.91 Ratio (ref 0.00–0.90)

## 2020-10-18 LAB — RPR+RH+ABO+RUB AB+AB SCR+CB...
Antibody Screen: NEGATIVE
HIV Screen 4th Generation wRfx: NONREACTIVE
Hematocrit: 36.3 % (ref 34.0–46.6)
Hemoglobin: 11.6 g/dL (ref 11.1–15.9)
Hepatitis B Surface Ag: NEGATIVE
MCH: 26.9 pg (ref 26.6–33.0)
MCHC: 32 g/dL (ref 31.5–35.7)
MCV: 84 fL (ref 79–97)
Platelets: 327 10*3/uL (ref 150–450)
RBC: 4.32 x10E6/uL (ref 3.77–5.28)
RDW: 13.8 % (ref 11.7–15.4)
RPR Ser Ql: NONREACTIVE
Rh Factor: POSITIVE
Rubella Antibodies, IGG: 1.38 index (ref >0.99)
Varicella zoster IgG: 757 index (ref >165)
WBC: 6.8 10*3/uL (ref 3.4–10.8)

## 2020-10-18 LAB — URINE CULTURE

## 2020-10-18 LAB — HGB FRACTIONATION CASCADE
Hgb A2: 2.3 % (ref 1.8–3.2)
Hgb A: 97.7 % (ref 96.4–98.8)
Hgb F: 0 % (ref 0.0–2.0)
Hgb S: 0 %

## 2020-10-18 LAB — HEMOGLOBIN A1C
Est. average glucose Bld gHb Est-mCnc: 123 mg/dL
Hgb A1c MFr Bld: 5.9 % — ABNORMAL HIGH (ref 4.8–5.6)

## 2020-10-18 LAB — HEPATITIS C ANTIBODY: Hep C Virus Ab: <0.1 s/co ratio (ref 0.0–0.9)

## 2020-10-19 LAB — CYTOLOGY - PAP
Chlamydia: NEGATIVE
Comment: NEGATIVE
Comment: NEGATIVE
Comment: NEGATIVE
Comment: NORMAL
Diagnosis: NEGATIVE
High risk HPV: NEGATIVE
Neisseria Gonorrhea: NEGATIVE
Trichomonas: NEGATIVE

## 2020-10-21 LAB — MATERNIT21 PLUS CORE+SCA
Fetal Fraction: 5
Monosomy X (Turner Syndrome): NOT DETECTED
Result (T21): NEGATIVE
Trisomy 13 (Patau syndrome): NEGATIVE
Trisomy 18 (Edwards syndrome): NEGATIVE
Trisomy 21 (Down syndrome): NEGATIVE
XXX (Triple X Syndrome): NOT DETECTED
XXY (Klinefelter Syndrome): NOT DETECTED
XYY (Jacobs Syndrome): NOT DETECTED

## 2020-10-22 ENCOUNTER — Other Ambulatory Visit: Payer: Self-pay | Admitting: Obstetrics and Gynecology

## 2020-10-22 DIAGNOSIS — O09891 Supervision of other high risk pregnancies, first trimester: Secondary | ICD-10-CM

## 2020-10-22 DIAGNOSIS — R7303 Prediabetes: Secondary | ICD-10-CM

## 2020-10-22 NOTE — Telephone Encounter (Signed)
We are not allowed to administer jelly bean testing via [facility] for glucose testing.

## 2020-10-22 NOTE — Telephone Encounter (Signed)
Could you send this patient information of doing the jelly bean test for a 1 gtt? Thank you

## 2020-10-24 LAB — MONITOR DRUG PROFILE 10(MW)

## 2020-10-25 ENCOUNTER — Other Ambulatory Visit: Payer: Medicaid Other

## 2020-10-25 ENCOUNTER — Telehealth: Payer: Self-pay | Admitting: Obstetrics and Gynecology

## 2020-10-25 ENCOUNTER — Other Ambulatory Visit: Payer: Self-pay

## 2020-10-25 DIAGNOSIS — R7303 Prediabetes: Secondary | ICD-10-CM | POA: Diagnosis not present

## 2020-10-25 NOTE — Telephone Encounter (Signed)
This pt. Is taking the BP medicine that you prescribed her, but she is having severe headaches.  She wanted to see if you would give her a call.

## 2020-10-26 LAB — GLUCOSE TOLERANCE, 1 HOUR: Glucose, 1Hr PP: 118 mg/dL (ref 65–199)

## 2020-10-30 ENCOUNTER — Encounter: Payer: Self-pay | Admitting: Obstetrics and Gynecology

## 2020-10-30 ENCOUNTER — Other Ambulatory Visit: Payer: Self-pay

## 2020-10-30 ENCOUNTER — Ambulatory Visit (INDEPENDENT_AMBULATORY_CARE_PROVIDER_SITE_OTHER): Payer: Medicaid Other | Admitting: Obstetrics and Gynecology

## 2020-10-30 VITALS — BP 128/84 | Wt 245.0 lb

## 2020-10-30 DIAGNOSIS — O10919 Unspecified pre-existing hypertension complicating pregnancy, unspecified trimester: Secondary | ICD-10-CM

## 2020-10-30 DIAGNOSIS — O09891 Supervision of other high risk pregnancies, first trimester: Secondary | ICD-10-CM

## 2020-10-30 DIAGNOSIS — Z3A12 12 weeks gestation of pregnancy: Secondary | ICD-10-CM

## 2020-10-30 DIAGNOSIS — O09529 Supervision of elderly multigravida, unspecified trimester: Secondary | ICD-10-CM

## 2020-10-30 LAB — INHERITEST CORE(CF97,SMA,FRAX)

## 2020-10-30 NOTE — Progress Notes (Signed)
Routine Prenatal Care Visit  Subjective  Sarah Burke is a 40 y.o. G3P1011 at [redacted]w[redacted]d being seen today for ongoing prenatal care.  She is currently monitored for the following issues for this high-risk pregnancy and has History of Bell's palsy; Situational mixed anxiety and depressive disorder; Class 3 severe obesity due to excess calories with serious comorbidity and body mass index (BMI) of 40.0 to 44.9 in adult [facility]); Vaginal pain; Urinary frequency; Metabolic syndrome; Prediabetes; Hyperlipidemia, mixed; Class 3 obesity with alveolar hypoventilation, serious comorbidity, and body mass index (BMI) of 45.0 to 49.9 in adult [facility]); Cluster headache, not intractable; Inadequate sleep hygiene; Psychophysiological insomnia; Supervision of other high risk pregnancies, first trimester; Chronic hypertension affecting pregnancy; History of preterm delivery, currently pregnant in first trimester; and AMA (advanced maternal age) multigravida 35+, unspecified trimester on their problem list.  ----------------------------------------------------------------------------------- Patient reports no complaints.    . Vag. Bleeding: None.   . Leaking Fluid denies.  ----------------------------------------------------------------------------------- The following portions of the patient's history were reviewed and updated as appropriate: allergies, current medications, past family history, past medical history, past social history, past surgical history and problem list. Problem list updated.  Objective  Blood pressure 128/84, weight 245 lb (111.1 kg), last menstrual period 08/08/2020. Pregravid weight Pregravid weight not on file Total Weight Gain Not found. Urinalysis: Urine Protein    Urine Glucose    Fetal Status:           General:  Alert, oriented and cooperative. Patient is in no acute distress.  Skin: Skin is warm and dry. No rash noted.   Cardiovascular: Normal heart rate noted  Respiratory: Normal  respiratory effort, no problems with respiration noted  Abdomen: Soft, gravid, appropriate for gestational age.       Pelvic:  Cervical exam deferred        Extremities: Normal range of motion.     Mental Status: Normal mood and affect. Normal behavior. Normal judgment and thought content.   Assessment   39 y.o. G3P1011 at [redacted]w[redacted]d by  05/14/2021, by Ultrasound presenting for routine prenatal visit  Plan   pregnancy 3 Problems (from 10/16/20 to present)     Problem Noted Resolved   Supervision of other high risk pregnancies, first trimester 10/16/2020 by Natale Milch, MD No   Overview Addendum 10/30/2020  5:10 PM by Conard Novak, MD     Nursing Staff Provider  Office Location  [facility] Dating  10 wk [place]  Language  English Anatomy US    Flu Vaccine   Genetic Screen  NIPS:   TDaP vaccine    Hgb A1C or  GTT Early : Third trimester :   Covid unvaccinated   LAB RESULTS   Rhogam   Blood Type A/Positive/-- (09/06 0953)   Feeding Plan  Antibody Negative (09/06 0953)  Contraception  Rubella 1.38 (09/06 0953)  Circumcision  RPR Non Reactive (09/06 0953)   Pediatrician   HBsAg Negative (09/06 0953)   Support Person  HIV Non Reactive (09/06 1610)  Prenatal Classes  Varicella     GBS    BTL Consent     VBAC Consent  Pap  2022    Hgb Electro    Pelvis Tested  CF      SMA        History of preterm delivery- [ ]  16 week cervical length- consider 17-P versus cerclage  CHTN, history of gestational hypertension 10/16/2020- started procardia XL 30 mg   Advanced maternal age Maternal obesity-  pregravid BMI 45 History of prediabetes History of HSV- checking serology [ ]  36 wk supression       Chronic hypertension affecting pregnancy 10/16/2020 by 12/16/2020, MD No   History of preterm delivery, currently pregnant in first trimester 10/16/2020 by 12/16/2020, MD No   Overview Signed 10/30/2020  5:10 PM by 11/01/2020, MD    ~5 months, cervical  insufficiecy? [ ]  MFM consult ASAP for possible treatment vs cerclage      AMA (advanced maternal age) multigravida 35+, unspecified trimester 10/16/2020 by , MD No        Preterm labor symptoms and general obstetric precautions including but not limited to vaginal bleeding, contractions, leaking of fluid and fetal movement were reviewed in detail with the patient. Please refer to After Visit Summary for other counseling recommendations.   Will see about cervical cerclage (prophylactic for this patient)  Return in about 4 weeks (around 11/27/2020) for ROB.   Natale Milch, MD, 11/29/2020 OB/GYN, [facility] 10/30/2020 5:10 PM

## 2020-11-07 ENCOUNTER — Other Ambulatory Visit: Payer: Self-pay | Admitting: *Deleted

## 2020-11-07 ENCOUNTER — Other Ambulatory Visit: Payer: Self-pay

## 2020-11-07 ENCOUNTER — Ambulatory Visit (HOSPITAL_BASED_OUTPATIENT_CLINIC_OR_DEPARTMENT_OTHER): Payer: Medicaid Other | Admitting: Obstetrics and Gynecology

## 2020-11-07 ENCOUNTER — Encounter: Payer: Self-pay | Admitting: *Deleted

## 2020-11-07 ENCOUNTER — Ambulatory Visit: Payer: Medicaid Other | Admitting: *Deleted

## 2020-11-07 ENCOUNTER — Ambulatory Visit: Payer: Medicaid Other | Attending: Obstetrics and Gynecology

## 2020-11-07 VITALS — BP 128/72 | HR 84

## 2020-11-07 DIAGNOSIS — O10011 Pre-existing essential hypertension complicating pregnancy, first trimester: Secondary | ICD-10-CM | POA: Insufficient documentation

## 2020-11-07 DIAGNOSIS — O10919 Unspecified pre-existing hypertension complicating pregnancy, unspecified trimester: Secondary | ICD-10-CM

## 2020-11-07 DIAGNOSIS — O10911 Unspecified pre-existing hypertension complicating pregnancy, first trimester: Secondary | ICD-10-CM

## 2020-11-07 DIAGNOSIS — O99211 Obesity complicating pregnancy, first trimester: Secondary | ICD-10-CM

## 2020-11-07 DIAGNOSIS — Z3A13 13 weeks gestation of pregnancy: Secondary | ICD-10-CM

## 2020-11-07 DIAGNOSIS — O09521 Supervision of elderly multigravida, first trimester: Secondary | ICD-10-CM

## 2020-11-07 DIAGNOSIS — O09891 Supervision of other high risk pregnancies, first trimester: Secondary | ICD-10-CM | POA: Insufficient documentation

## 2020-11-07 DIAGNOSIS — O09529 Supervision of elderly multigravida, unspecified trimester: Secondary | ICD-10-CM | POA: Diagnosis not present

## 2020-11-07 DIAGNOSIS — O09292 Supervision of pregnancy with other poor reproductive or obstetric history, second trimester: Secondary | ICD-10-CM | POA: Insufficient documentation

## 2020-11-07 DIAGNOSIS — O3431 Maternal care for cervical incompetence, first trimester: Secondary | ICD-10-CM

## 2020-11-07 NOTE — Progress Notes (Signed)
Maternal-Fetal Medicine   Name: Sarah Burke DOB: 12-Jan-1981 MRN: [id number] Referring Provider: Thomasene Mohair, MD  I had the pleasure of seeing Ms. [facility] at [facility]. She is G3 P1011 at 13-weeks' gestation and is here for first-trimester ultrasound and consultation. Her problems include: -History of midtrimester pregnancy loss at [redacted] weeks gestation. -Chronic hypertension -Advanced maternal age. -Class III obesity.  Obstetric history is significant for a term vaginal delivery in June 2015 of a female infant weighing 5 pounds and 13 ounces at birth.  Her pregnancy was apparently complicated by single umbilical artery and fetal growth restriction.  She developed gestational hypertension before induction of labor.  In October 2021, in [place] state, at 19 weeks' gestation.  Patient complains of abdominal cramping and was evaluated that the ED.  She had sexual intercourse before onset of cramping and had vaginal spotting.  She was prescribed antibiotics to treat urine infection.  No vaginal examination was performed that day.  Patient returned home and on the following day she had a's spontaneous miscarriage of a female infant. We do not have her delivery records.  GYN history: No history of abnormal Pap smears or cervical surgeries.  No history of breast disease.  Her previous menstrual cycles were reportedly regular.  Past medical history: Chronic hypertension.  Patient was prescribed nifedipine earlier but was later discontinued because of normal blood pressures.  She gives history of Bell's palsy 4 years ago and had completely recovered.  Her recent hemoglobin A1c was 5.9%.  She does not have type 2 diabetes. Past surgical history: Nil of note. Medications: Prenatal vitamins, low-dose aspirin aspirin. Allergies: No known drug allergies. Social history: Denies tobacco or drug or alcohol use.  She is single and her partner is in good health he is  African-American and is a father of her second pregnancy.  She is a Interior and spatial designer. Family history: Father had bypass surgery and is alive and well.  No history of venous thromboembolism in the family.  Prenatal course: On cell free fetal DNA screening, the risks of fetal aneuploidies are not increased.  Early screening ruled out gestational diabetes. Blood pressure today at her office is 128/72 mmHg and pulse 84/minute.  Prepregnancy BMI 46. Ultrasound On ultrasound, the CRL measurement is consistent with her previously-established dates and good fetal heart activity is seen. The nuchal translucency (NT) could not be measured.  Fetal anatomy that could be ascertained at this gestational age is normal.  Or concerns include: History of midtrimester pregnancy loss It is difficult to ascertain the exact cause of midtrimester miscarriage.  Patient had a term vaginal delivery and did not have any cervical surgeries.  However, based on her history it is possible that she had cervical insufficiency that lead to midtrimester miscarriage.  I explained that second trimester pregnancy losses are associated with increased risk of recurrent miscarriage or preterm deliveries.  I discussed the options of a) prophylactic cerclage in 1 to 2 weeks, or b) weekly cervical length measurements from 16 weeks till 23 weeks' gestation.  I explained cerclage procedure and its possible complications including bleeding, infection, miscarriage, injuries to bladder or bowel (rare).  Patient opted to have prophylactic cerclage.  Chronic hypertension -Adverse outcomes of severe chronic hypertension include maternal stroke, endorgan damage, coagulation disturbances and placental abruption.  Superimposed preeclampsia occurs in 30% of women with chronic hypertension.  I discussed the benefit of low-dose aspirin prophylaxis that helps delaying or preventing preeclampsia.  -I discussed the safety profile  of antihypertensives.   Labetalol or nifedipine can be safely given in pregnancy.   -I discussed ultrasound protocol of monitoring fetal growth assessment and antenatal testing.  -Timing of delivery: Provided her blood pressures are well controlled, she can be delivered at 39 weeks' gestation.  Early term delivery is an option if hypertension is not well controlled.  Advanced maternal age Patient is aware of increased risk of fetal aneuploidies with advanced maternal age.  I discussed the significance and limitations of cell free fetal DNA screening.  Patient opted not to have invasive testing.  Maternal obesity: -Obesity is associated with increased risk of miscarriages, fetal congenital malformations including open neural tube defects and stillbirth.  Although there is an increase in stillbirth rate the absolute risk is very small (1-2 per 1,000 births).  -Ultrasound has limitations in detecting congenital malformations and fetal anomalies can be missed. -Gestational hypertension and gestational diabetes or more common in women with class III obesity.   -Increase in nonalcoholic fatty liver disease has been reported.  Patient does not have underlying liver condition and we should, therefore, expect good maternal outcomes.  -Obesity is associated with increased risk of venous thromboembolism.  If patient undergoes cesarean delivery or has prolonged hospitalization, prophylactic anticoagulation should be considered.  -Fetal macrosomia is also increased with maternal obesity.  -We do not recommend weight loss during pregnancy.  A weight gain of 11 to 20 pounds (5 to 9 kg) is recommended.  -Early epidural analgesia in labor is recommended.  -Operative wound infections are more frequent.  Recommendations -Prophylactic cerclage in 1 to 2 weeks. -An appointment was made for her to return in 6 weeks for detailed fetal anatomical survey and cervical length measurement (assuming she would have had prophylactic  cerclage). -Fetal growth assessments every 4 weeks till delivery. -Weekly BPP from 32 weeks' gestation if patient takes antihypertensives.  Thank you for consultation.  If you have any questions or concerns, please contact me the [facility].  Consultation including face-to-face (more than 50%) counseling 45 minutes.

## 2020-11-09 ENCOUNTER — Telehealth: Payer: Self-pay

## 2020-11-09 NOTE — Telephone Encounter (Signed)
-----   Message from Natale Milch, MD sent at 11/07/2020  4:27 PM EDT ----- Surgery Booking Request Patient Full Name:  ARVETTA ARAQUE  MRN: [id number]  DOB: 1980-11-06  Surgeon: Natale Milch, MD  Requested Surgery Date and Time: in next 7-14 days Primary Diagnosis AND Code: history of cervical insufficiency Secondary Diagnosis and Code:  Surgical Procedure: Cerclage RNFA Requested?: No L&D Notification: No Admission Status: same day surgery Length of Surgery: 50 min Special Case Needs: No H&P: No Phone Interview???:  Yes Interpreter: No Medical Clearance:  No Special Scheduling Instructions: No Any known health/anesthesia issues, diabetes, sleep apnea, latex allergy, defibrillator/pacemaker?: No Acuity: P2   (P1 highest, P2 delay may cause harm, P3 low, elective gyn, P4 lowest) Post op follow up visits: 1 week post op

## 2020-11-09 NOTE — Telephone Encounter (Signed)
Called patient to schedule Cerclage cervical w [facility]  DOS 11/15/20  H&P n/a  Pre-admit phone call appointment to be requested. All appointments will be updated on pt MyChart. Explained that this appointment has a call window. Based on the time scheduled will indicate if the call will be received within a 4 hour window before 1:00 or after.  Advised that pt may also receive calls from the hospital pharmacy and pre-service center.  Confirmed pt has [employer] as primary insurance. No secondary insurance.

## 2020-11-09 NOTE — Telephone Encounter (Signed)
Left a message for the patient to return the call.  

## 2020-11-10 DIAGNOSIS — Z419 Encounter for procedure for purposes other than remedying health state, unspecified: Secondary | ICD-10-CM | POA: Diagnosis not present

## 2020-11-10 NOTE — Telephone Encounter (Signed)
Orders placed.

## 2020-11-13 ENCOUNTER — Other Ambulatory Visit: Payer: Self-pay

## 2020-11-13 ENCOUNTER — Encounter
Admission: RE | Admit: 2020-11-13 | Discharge: 2020-11-13 | Disposition: A | Discharge status: Home / Self Care | Payer: Medicaid Other | Source: Ambulatory Visit | Attending: Obstetrics and Gynecology | Admitting: Obstetrics and Gynecology

## 2020-11-13 HISTORY — DX: Prediabetes: R73.03

## 2020-11-13 NOTE — Patient Instructions (Addendum)
Your procedure is scheduled on: Thursday 11/15/20 Report to the Registration Desk on the 1st floor of the [facility]. To find out your arrival time, please call [phone] between 1PM - 3PM on: Wednesday 11/14/20  REMEMBER: Instructions that are not followed completely may result in serious medical risk, up to and including death; or upon the discretion of your surgeon and anesthesiologist your surgery may need to be rescheduled.  Do not eat food after midnight the night before surgery.  No gum chewing, lozengers or hard candies.  Due to you having Pre-Diabetes you should only drink water.  Please refrain from taking anymore Aspirin   One week prior to surgery: Stop Anti-inflammatories (NSAIDS) such as Advil, Aleve, Ibuprofen, Motrin, Naproxen, Naprosyn and Aspirin based products such as Excedrin, Goodys Powder, BC Powder.  Stop ANY OVER THE COUNTER Prenatal Multivitamin supplements until after surgery.  You may however, continue to take Tylenol if needed for pain up until the day of surgery.  No Alcohol for 24 hours before or after surgery.  No Smoking including e-cigarettes for 24 hours prior to surgery.  No chewable tobacco products for at least 6 hours prior to surgery.  No nicotine patches on the day of surgery.  Do not use any "recreational" drugs for at least a week prior to your surgery.  Please be advised that the combination of cocaine and anesthesia may have negative outcomes, up to and including death. If you test positive for cocaine, your surgery will be cancelled.  On the morning of surgery brush your teeth with toothpaste and water, you may rinse your mouth with mouthwash if you wish. Do not swallow any toothpaste or mouthwash.  Do not wear jewelry, make-up, hairpins, clips or nail polish.  Do not wear lotions, powders, or perfumes.   Do not shave body from the neck down 48 hours prior to surgery just in case you cut yourself which could leave a site for  infection.   Do not bring valuables to the hospital. [facility] is not responsible for any missing/lost belongings or valuables.   Notify your doctor if there is any change in your medical condition (cold, fever, infection).  Wear comfortable clothing (specific to your surgery type) to the hospital.  After surgery, you can help prevent lung complications by doing breathing exercises.   If you are being discharged the day of surgery, you will not be allowed to drive home. You will need a responsible adult (18 years or older) to drive you home and stay with you that night.   If you are taking public transportation, you will need to have a responsible adult (18 years or older) with you. Please confirm with your physician that it is acceptable to use public transportation.   Please call the Pre-admissions Testing Dept. at [phone] if you have any questions about these instructions.  Surgery Visitation Policy:  Patients undergoing a surgery or procedure may have one family member or support person with them as long as that person is not COVID-19 positive or experiencing its symptoms.  That person may remain in the waiting area during the procedure and may rotate out with other people.  Inpatient Visitation:    Visiting hours are 7 a.m. to 8 p.m. Up to two visitors ages 16+ are allowed at one time in a patient room. The visitors may rotate out with other people during the day. Visitors must check out when they leave, or other visitors will not be allowed. One designated  support person may remain overnight. The visitor must pass COVID-19 screenings, use hand sanitizer when entering and exiting the patient's room and wear a mask at all times, including in the patient's room. Patients must also wear a mask when staff or their visitor are in the room. Masking is required regardless of vaccination status.

## 2020-11-14 ENCOUNTER — Encounter: Payer: Self-pay | Admitting: Urgent Care

## 2020-11-14 ENCOUNTER — Encounter
Admission: RE | Admit: 2020-11-14 | Discharge: 2020-11-14 | Disposition: A | Discharge status: Home / Self Care | Payer: Medicaid Other | Source: Ambulatory Visit | Attending: Obstetrics and Gynecology | Admitting: Obstetrics and Gynecology

## 2020-11-14 DIAGNOSIS — Z01818 Encounter for other preprocedural examination: Secondary | ICD-10-CM | POA: Diagnosis not present

## 2020-11-14 LAB — CBC
HCT: 31.7 % — ABNORMAL LOW (ref 36.0–46.0)
Hemoglobin: 10.4 g/dL — ABNORMAL LOW (ref 12.0–15.0)
MCH: 27.6 pg (ref 26.0–34.0)
MCHC: 32.8 g/dL (ref 30.0–36.0)
MCV: 84.1 fL (ref 80.0–100.0)
Platelets: 307 10*3/uL (ref 150–400)
RBC: 3.77 MIL/uL — ABNORMAL LOW (ref 3.87–5.11)
RDW: 13.7 % (ref 11.5–15.5)
WBC: 6.9 10*3/uL (ref 4.0–10.5)
nRBC: 0 % (ref 0.0–0.2)

## 2020-11-14 LAB — TYPE AND SCREEN
ABO/RH(D): A POS
Antibody Screen: NEGATIVE
Extend sample reason: UNDETERMINED

## 2020-11-15 ENCOUNTER — Other Ambulatory Visit: Payer: Self-pay

## 2020-11-15 ENCOUNTER — Ambulatory Visit
Admission: RE | Admit: 2020-11-15 | Discharge: 2020-11-15 | Disposition: A | Discharge status: Home / Self Care | Payer: Medicaid Other | Source: Ambulatory Visit | Attending: Obstetrics and Gynecology | Admitting: Obstetrics and Gynecology

## 2020-11-15 ENCOUNTER — Ambulatory Visit: Payer: Medicaid Other | Admitting: Anesthesiology

## 2020-11-15 ENCOUNTER — Encounter
Admission: RE | Disposition: A | Discharge status: Home / Self Care | Payer: Self-pay | Source: Ambulatory Visit | Attending: Obstetrics and Gynecology

## 2020-11-15 ENCOUNTER — Encounter: Payer: Self-pay | Admitting: Obstetrics and Gynecology

## 2020-11-15 DIAGNOSIS — O3432 Maternal care for cervical incompetence, second trimester: Secondary | ICD-10-CM | POA: Insufficient documentation

## 2020-11-15 DIAGNOSIS — O09891 Supervision of other high risk pregnancies, first trimester: Secondary | ICD-10-CM | POA: Diagnosis not present

## 2020-11-15 DIAGNOSIS — Z3A14 14 weeks gestation of pregnancy: Secondary | ICD-10-CM | POA: Diagnosis not present

## 2020-11-15 DIAGNOSIS — O09522 Supervision of elderly multigravida, second trimester: Secondary | ICD-10-CM | POA: Diagnosis not present

## 2020-11-15 DIAGNOSIS — Z7982 Long term (current) use of aspirin: Secondary | ICD-10-CM | POA: Diagnosis not present

## 2020-11-15 DIAGNOSIS — O09212 Supervision of pregnancy with history of pre-term labor, second trimester: Secondary | ICD-10-CM | POA: Diagnosis not present

## 2020-11-15 DIAGNOSIS — O3430 Maternal care for cervical incompetence, unspecified trimester: Secondary | ICD-10-CM | POA: Diagnosis not present

## 2020-11-15 DIAGNOSIS — O10912 Unspecified pre-existing hypertension complicating pregnancy, second trimester: Secondary | ICD-10-CM | POA: Diagnosis not present

## 2020-11-15 DIAGNOSIS — E7800 Pure hypercholesterolemia, unspecified: Secondary | ICD-10-CM | POA: Diagnosis not present

## 2020-11-15 DIAGNOSIS — O3431 Maternal care for cervical incompetence, first trimester: Secondary | ICD-10-CM

## 2020-11-15 DIAGNOSIS — Z2839 Other underimmunization status: Secondary | ICD-10-CM | POA: Diagnosis not present

## 2020-11-15 HISTORY — PX: CERVICAL CERCLAGE: SHX1329

## 2020-11-15 LAB — GLUCOSE, CAPILLARY
Glucose-Capillary: 91 mg/dL (ref 70–99)
Glucose-Capillary: 67 mg/dL — ABNORMAL LOW (ref 70–99)
Glucose-Capillary: 89 mg/dL (ref 70–99)

## 2020-11-15 SURGERY — CERCLAGE, CERVIX, VAGINAL APPROACH
Anesthesia: General

## 2020-11-15 MED ORDER — FENTANYL CITRATE (PF) 100 MCG/2ML IJ SOLN
INTRAMUSCULAR | Status: AC
  Filled 2020-11-15: qty 2

## 2020-11-15 MED ORDER — ORAL CARE MOUTH RINSE: 15.0000 mL | Freq: Once | OROMUCOSAL | Status: AC

## 2020-11-15 MED ORDER — FAMOTIDINE 20 MG PO TABS: 20.0000 mg | ORAL_TABLET | Freq: Once | ORAL | Status: AC

## 2020-11-15 MED ORDER — MIDAZOLAM HCL 2 MG/2ML IJ SOLN
INTRAMUSCULAR | Status: AC
  Filled 2020-11-15: qty 4

## 2020-11-15 MED ORDER — DEXTROSE 50 % IV SOLN: 25.0000 mL | Freq: Once | INTRAVENOUS | Status: AC

## 2020-11-15 MED ORDER — FAMOTIDINE 20 MG PO TABS
ORAL_TABLET | ORAL | Status: AC
  Administered 2020-11-15: 20 mg via ORAL
  Filled 2020-11-15: qty 1

## 2020-11-15 MED ORDER — CHLORHEXIDINE GLUCONATE 0.12 % MT SOLN
OROMUCOSAL | Status: AC
  Administered 2020-11-15: 15 mL via OROMUCOSAL
  Filled 2020-11-15: qty 15

## 2020-11-15 MED ORDER — PROPOFOL 10 MG/ML IV BOLUS
INTRAVENOUS | Status: DC | PRN
  Administered 2020-11-15: 50 mg via INTRAVENOUS
  Administered 2020-11-15: 150 mg via INTRAVENOUS

## 2020-11-15 MED ORDER — FENTANYL CITRATE (PF) 100 MCG/2ML IJ SOLN: 25.0000 ug | INTRAMUSCULAR | Status: DC | PRN

## 2020-11-15 MED ORDER — LACTATED RINGERS IV SOLN: INTRAVENOUS | Status: DC | PRN

## 2020-11-15 MED ORDER — CHLORHEXIDINE GLUCONATE 0.12 % MT SOLN: 15.0000 mL | Freq: Once | OROMUCOSAL | Status: AC

## 2020-11-15 MED ORDER — DEXTROSE 50 % IV SOLN
INTRAVENOUS | Status: AC
  Administered 2020-11-15: 25 mL via INTRAVENOUS
  Filled 2020-11-15: qty 50

## 2020-11-15 MED ORDER — 0.9 % SODIUM CHLORIDE (POUR BTL) OPTIME
TOPICAL | Status: DC | PRN
  Administered 2020-11-15: 100 mL

## 2020-11-15 MED ORDER — FENTANYL CITRATE (PF) 100 MCG/2ML IJ SOLN
INTRAMUSCULAR | Status: DC | PRN
  Administered 2020-11-15 (×2): 50 ug via INTRAVENOUS

## 2020-11-15 SURGICAL SUPPLY — 16 items
CATH ROBINSON RED A/P 16FR (CATHETERS) ×2 IMPLANT
GAUZE 4X4 16PLY ~~LOC~~+RFID DBL (SPONGE) ×2 IMPLANT
GLOVE SURG SYN 6.5 ES PF (GLOVE) ×4 IMPLANT
GLOVE SURG UNDER POLY LF SZ6.5 (GLOVE) ×4 IMPLANT
GOWN STRL REUS W/ TWL LRG LVL3 (GOWN DISPOSABLE) ×2 IMPLANT
GOWN STRL REUS W/TWL LRG LVL3 (GOWN DISPOSABLE) ×4
KIT TURNOVER CYSTO (KITS) ×2 IMPLANT
MANIFOLD NEPTUNE II (INSTRUMENTS) ×2 IMPLANT
NS IRRIG 500ML POUR BTL (IV SOLUTION) ×2 IMPLANT
PACK DNC HYST (MISCELLANEOUS) ×2 IMPLANT
PAD OB MATERNITY 4.3X12.25 (PERSONAL CARE ITEMS) ×2 IMPLANT
PAD PREP 24X41 OB/GYN DISP (PERSONAL CARE ITEMS) ×2 IMPLANT
SCRUB EXIDINE 4% CHG 4OZ (MISCELLANEOUS) ×2 IMPLANT
SUT POLY BUTTON 15MM (SUTURE) ×2 IMPLANT
SUT PROLENE 1 CT (SUTURE) ×2 IMPLANT
WATER STERILE IRR 500ML POUR (IV SOLUTION) ×2 IMPLANT

## 2020-11-15 NOTE — Op Note (Addendum)
11/15/2020  2:15 PM  PATIENT:  Sarah Burke  40 y.o. female  PRE-OPERATIVE DIAGNOSIS:  History of cervical insufficiency  POST-OPERATIVE DIAGNOSIS:  History of cervical insufficiency  PROCEDURE:  Procedure(s): CERCLAGE CERVICAL (N/A)  SURGEON:  Nickisha Hum R Khaleelah Yowell MD  ANESTHESIA:   general  EBL:  5 mL   COMPLICATIONS: None     INDICATIONS: History of prior second trimester delivery   FINDINGS: Cervix closed and long  PROCEDURE IN DETAIL: Patient was taken to the OR where anesthesia was established. She was positioned into the dorsal lithotomy position with her feet in [employer]. She was prepped and draped in the usual sterile manner. A weighted speculum was inserted into the posterior vaginal vault and a right angle retractor was used to visualize the cervix. The above findings were noted. A McDonald cerclage was then placed using #1 Prolene Monofilament suture. The anterior lip of the cervix was grasped at the 12 and 9 o'clock position with an allis clamp. The suture was passed from 12 to 9 o'clock through the cervical tissue. The initial suture was placed at the junction of the rugated vaginal epithelium and smooth cervix just distal to the vesicocervical reflection and at least 2 cm above the external os, as high as surgically feasible. The allis clamps were then moved to the 9 and 6 o'clock positions and the suture was passed from the 9 to the 6 o'clock position. The allis clamps were then moved to the 6 and 3 o'clock positions and the uture was passed from the 6 to the 3 o'clock position. The allis clamps were then moved to the 3 and 12 o'clock positions and the suture was passed from the 3 to the 12 o'clock position. Twelve knots were then tied at the 12 o'clock position. Excellent hemostasis was achieved throughout the procedure. The cervix was visually closed at the end of the procedure. All instruments were removed from the vagina and the procedure was discontinued. All  counts were correct times two. The patient was taken to the recovery room in stable condition. There were no complications.   FHR of 138 bpm obtained in the PACU  PLAN OF CARE: Discharge to home after PACU  PATIENT DISPOSITION:  PACU - hemodynamically stable.   [name] MD [facility], [facility] 11/15/20 2:15 PM

## 2020-11-15 NOTE — Progress Notes (Signed)
Fetal Hear Rate taken by Mother/Baby staff reading of 158 BPM

## 2020-11-15 NOTE — Progress Notes (Signed)
Fetal Heart Rate taken by Dr. Jerene Pitch, reading 138

## 2020-11-15 NOTE — Discharge Instructions (Addendum)
DO NOT have intercourse after cerclage placement for the duration of pregnancy. Nothing should be inserted into the vagina.   AMBULATORY SURGERY  DISCHARGE INSTRUCTIONS   The drugs that you were given will stay in your system until tomorrow so for the next 24 hours you should not:  Drive an automobile Make any legal decisions Drink any alcoholic beverage   You may resume regular meals tomorrow.  Today it is better to start with liquids and gradually work up to solid foods.  You may eat anything you prefer, but it is better to start with liquids, then soup and crackers, and gradually work up to solid foods.   Please notify your doctor immediately if you have any unusual bleeding, trouble breathing, redness and pain at the surgery site, drainage, fever, or pain not relieved by medication.    Additional Instructions:     Please contact your physician with any problems or Same Day Surgery at [phone], Monday through Friday 6 am to 4 pm, or [place] [facility] number at [phone].

## 2020-11-15 NOTE — Transfer of Care (Signed)
Immediate Anesthesia Transfer of Care Note  Patient: Sarah Burke  Procedure(s) Performed: Procedure(s): CERCLAGE CERVICAL (N/A)  Patient Location: PACU  Anesthesia Type:General  Level of Consciousness: sedated  Airway & Oxygen Therapy: Patient Spontanous Breathing and Patient connected to face mask oxygen  Post-op Assessment: Report given to RN and Post -op Vital signs reviewed and stable  Post vital signs: Reviewed and stable  Last Vitals:  Vitals:   11/15/20 1022 11/15/20 1406  BP: 132/89 111/62  Pulse: 90 93  Resp: 20 16  Temp: 36.6 C (!) 36.1 C  SpO2: 100% 100%    Complications: No apparent anesthesia complications

## 2020-11-15 NOTE — Anesthesia Procedure Notes (Signed)
Procedure Name: LMA Insertion Date/Time: 11/15/2020 1:11 AM Performed by: Stormy Fabian, CRNA Pre-anesthesia Checklist: Patient identified, Patient being monitored, Timeout performed, Emergency Drugs available and Suction available Patient Re-evaluated:Patient Re-evaluated prior to induction Oxygen Delivery Method: Circle system utilized Preoxygenation: Pre-oxygenation with 100% oxygen Induction Type: IV induction Ventilation: Mask ventilation without difficulty LMA: LMA inserted LMA Size: 5.0 Tube type: Oral Number of attempts: 1 Placement Confirmation: positive ETCO2 and breath sounds checked- equal and bilateral Tube secured with: Tape Dental Injury: Teeth and Oropharynx as per pre-operative assessment

## 2020-11-15 NOTE — H&P (Signed)
Sarah Burke is an 40 y.o. female.   Chief Complaint: History of second trimester preterm delivery HPI: 40 yo G3P1101 [redacted]w[redacted]d with a history of preterm delivery. She has consulted with MFM regarding history and cerclage was recommended. She has not complaints and is feeling well.   pregnancy 3 Problems (from 10/16/20 to present)     Problem Noted Resolved   Supervision of other high risk pregnancies, first trimester 10/16/2020 by Natale Milch, MD No   Overview Addendum 10/30/2020  5:10 PM by Conard Novak, MD     Nursing Staff Provider  Office Location  [facility] Dating  10 wk [place]  Language  English Anatomy US    Flu Vaccine   Genetic Screen  NIPS: normal XY  TDaP vaccine    Hgb A1C or  GTT Early : 5.9- 1GTT: 118 Third trimester :   Covid unvaccinated   LAB RESULTS   Rhogam  Not needed Blood Type A/Positive/-- (09/06 0953)   Feeding Plan  Antibody Negative (09/06 0953)  Contraception  Rubella 1.38 (09/06 0953)  Circumcision  RPR Non Reactive (09/06 0953)   Pediatrician   HBsAg Negative (09/06 0953)   Support Person  HIV Non Reactive (09/06 0953)  Prenatal Classes  Varicella  Immune    GBS    BTL Consent     VBAC Consent  Pap  2022    Hgb Electro  nromal  Pelvis Tested  CF negative     SMA negative       History of preterm delivery- [ ]  16 week cervical length- consider 17-P versus cerclage  CHTN, history of gestational hypertension 10/16/2020- started procardia XL 30 mg   Advanced maternal age Maternal obesity- pregravid BMI 45 History of prediabetes History of HSV- checking serology [ ]  36 wk supression       Chronic hypertension affecting pregnancy 10/16/2020 by , MD No   History of preterm delivery, currently pregnant in first trimester 10/16/2020 by Natale Milch, MD No   Overview Signed 10/30/2020  5:10 PM by Natale Milch, MD    ~5 months, cervical insufficiecy? [ ]  MFM consult ASAP for possible treatment vs cerclage       AMA (advanced maternal age) multigravida 35+, unspecified trimester 10/16/2020 by Conard Novak, MD No        Past Medical History:  Diagnosis Date   Anxiety    Back pain    Chest pain    Constipation    Edema, lower extremity    GERD (gastroesophageal reflux disease)    High cholesterol    History of PCOS    Hypertension    Infertility, female    Pre-diabetes    Prediabetes    Shortness of breath     Past Surgical History:  Procedure Laterality Date   DILATION AND CURETTAGE OF UTERUS      Family History  Problem Relation Age of Onset   Hypertension Mother    Obesity Mother    Diabetes Brother    Hypertension Father    Hyperlipidemia Father    Heart disease Father    Hypertension Maternal Grandmother    Hypercholesterolemia Maternal Grandmother    Breast cancer Maternal Grandmother    Breast cancer Maternal Aunt    Breast cancer Maternal Aunt    Social History:  reports that she has never smoked. She has never used smokeless tobacco. She reports current alcohol use. She reports that she does not use drugs.  Allergies: No Known Allergies  Medications Prior to Admission  Medication Sig Dispense Refill   acetaminophen (TYLENOL) 500 MG tablet Take 500 mg by mouth every 8 (eight) hours as needed for moderate pain.     aspirin EC 81 MG tablet Take 81 mg by mouth daily. Swallow whole.     Prenatal Vit-Fe Fumarate-FA (PRENATAL MULTIVITAMIN) TABS tablet Take 1 tablet by mouth daily.     NIFEdipine (PROCARDIA-XL/NIFEDICAL-XL) 30 MG 24 hr tablet Take 1 tablet (30 mg total) by mouth daily. Can increase to twice a day as needed for symptomatic contractions (Patient not taking: No sig reported) 30 tablet 2    Results for orders placed or performed during the hospital encounter of 11/15/20 (from the past 48 hour(s))  Glucose, capillary     Status: None   Collection Time: 11/15/20 10:22 AM  Result Value Ref Range   Glucose-Capillary 91 70 - 99 mg/dL    Comment:  Glucose reference range applies only to samples taken after fasting for at least 8 hours.   No results found.  Review of Systems  Constitutional:  Negative for chills and fever.  HENT:  Negative for congestion, hearing loss and sinus pain.   Respiratory:  Negative for cough, shortness of breath and wheezing.   Cardiovascular:  Negative for chest pain, palpitations and leg swelling.  Gastrointestinal:  Negative for abdominal pain, constipation, diarrhea, nausea and vomiting.  Genitourinary:  Negative for dysuria, flank pain, frequency, hematuria and urgency.  Musculoskeletal:  Negative for back pain.  Skin:  Negative for rash.  Neurological:  Negative for dizziness and headaches.  Psychiatric/Behavioral:  Negative for suicidal ideas. The patient is not nervous/anxious.    Blood pressure 132/89, pulse 90, temperature 97.9 F (36.6 C), temperature source Oral, resp. rate 20, height 5\' 2"  (1.575 m), weight 109.8 kg, last menstrual period 08/08/2020, SpO2 100 %. Physical Exam Vitals and nursing note reviewed.  Constitutional:      Appearance: Normal appearance. She is well-developed.  HENT:     Head: Normocephalic and atraumatic.  Cardiovascular:     Rate and Rhythm: Normal rate and regular rhythm.  Pulmonary:     Effort: Pulmonary effort is normal.     Breath sounds: Normal breath sounds.  Abdominal:     General: Bowel sounds are normal.     Palpations: Abdomen is soft.  Musculoskeletal:        General: Normal range of motion.  Skin:    General: Skin is warm and dry.  Neurological:     Mental Status: She is alert and oriented to person, place, and time.  Psychiatric:        Behavior: Behavior normal.        Thought Content: Thought content normal.        Judgment: Judgment normal.    FHR: 150s  Assessment/Plan 40 yo G3P1101 [redacted]w[redacted]d with history of preterm delivery Reviewed risks of bleeding, injury, infection, rupture of membranes and preterm labor with the patient. She  understands these risks and wishes to proceed with cerclage placement.    [redacted]w[redacted]d, MD 11/15/2020, 11:39 AM

## 2020-11-15 NOTE — Anesthesia Preprocedure Evaluation (Addendum)
Anesthesia Evaluation  Patient identified by MRN, date of birth, ID band Patient awake    Reviewed: Allergy & Precautions, H&P , NPO status , Patient's Chart, lab work & pertinent test results, reviewed documented beta blocker date and time   History of Anesthesia Complications Negative for: history of anesthetic complications  Airway Mallampati: III  TM Distance: >3 FB Neck ROM: full    Dental  (+) Dental Advidsory Given, Missing, Teeth Intact   Pulmonary neg pulmonary ROS,    Pulmonary exam normal breath sounds clear to auscultation       Cardiovascular Exercise Tolerance: Good hypertension, (-) angina(-) Past MI and (-) Cardiac Stents Normal cardiovascular exam(-) dysrhythmias (-) Valvular Problems/Murmurs Rhythm:regular Rate:Normal     Neuro/Psych PSYCHIATRIC DISORDERS Anxiety Depression negative neurological ROS     GI/Hepatic Neg liver ROS, GERD  ,  Endo/Other  diabetes (borderline)Morbid obesity  Renal/GU negative Renal ROS  negative genitourinary   Musculoskeletal   Abdominal   Peds  Hematology negative hematology ROS (+)   Anesthesia Other Findings Past Medical History: No date: Anxiety No date: Back pain No date: Chest pain No date: Constipation No date: Edema, lower extremity No date: GERD (gastroesophageal reflux disease) No date: High cholesterol No date: History of PCOS No date: Hypertension No date: Infertility, female No date: Pre-diabetes No date: Prediabetes No date: Shortness of breath   Reproductive/Obstetrics (+) Pregnancy (14 Weeks)                            Anesthesia Physical Anesthesia Plan  ASA: 3  Anesthesia Plan: General   Post-op Pain Management:    Induction: Intravenous  PONV Risk Score and Plan: 3 and Ondansetron, Dexamethasone and Treatment may vary due to age or medical condition  Airway Management Planned: LMA  Additional Equipment:    Intra-op Plan:   Post-operative Plan: Extubation in OR  Informed Consent: I have reviewed the patients History and Physical, chart, labs and discussed the procedure including the risks, benefits and alternatives for the proposed anesthesia with the patient or authorized representative who has indicated his/her understanding and acceptance.     Dental Advisory Given  Plan Discussed with: Anesthesiologist, CRNA and Surgeon  Anesthesia Plan Comments:         Anesthesia Quick Evaluation

## 2020-11-16 ENCOUNTER — Encounter: Payer: Self-pay | Admitting: Obstetrics and Gynecology

## 2020-11-16 NOTE — Anesthesia Postprocedure Evaluation (Signed)
Anesthesia Post Note  Patient: Sarah Burke  Procedure(s) Performed: CERCLAGE CERVICAL  Patient location during evaluation: PACU Anesthesia Type: General Level of consciousness: awake and alert Pain management: pain level controlled Vital Signs Assessment: post-procedure vital signs reviewed and stable Respiratory status: spontaneous breathing, nonlabored ventilation, respiratory function stable and patient connected to nasal cannula oxygen Cardiovascular status: blood pressure returned to baseline and stable Postop Assessment: no apparent nausea or vomiting Anesthetic complications: no   No notable events documented.   Last Vitals:  Vitals:   11/15/20 1445 11/15/20 1457  BP: 118/67 (!) 114/57  Pulse:  65  Resp:  18  Temp: (!) 36.2 C (!) 36.4 C  SpO2:  100%    Last Pain:  Vitals:   11/15/20 1457  TempSrc: Temporal  PainSc: 0-No pain                 Lenard Simmer

## 2020-11-27 ENCOUNTER — Other Ambulatory Visit: Payer: Self-pay

## 2020-11-27 ENCOUNTER — Encounter: Payer: Medicaid Other | Admitting: Obstetrics & Gynecology

## 2020-11-27 ENCOUNTER — Encounter: Payer: Self-pay | Admitting: Obstetrics and Gynecology

## 2020-11-27 ENCOUNTER — Ambulatory Visit (INDEPENDENT_AMBULATORY_CARE_PROVIDER_SITE_OTHER): Payer: Medicaid Other | Admitting: Obstetrics and Gynecology

## 2020-11-27 VITALS — BP 128/74 | Ht 62.0 in | Wt 243.0 lb

## 2020-11-27 DIAGNOSIS — O99212 Obesity complicating pregnancy, second trimester: Secondary | ICD-10-CM

## 2020-11-27 DIAGNOSIS — O3432 Maternal care for cervical incompetence, second trimester: Secondary | ICD-10-CM

## 2020-11-27 DIAGNOSIS — O09522 Supervision of elderly multigravida, second trimester: Secondary | ICD-10-CM

## 2020-11-27 DIAGNOSIS — Z3A16 16 weeks gestation of pregnancy: Secondary | ICD-10-CM

## 2020-11-27 DIAGNOSIS — O139 Gestational [pregnancy-induced] hypertension without significant proteinuria, unspecified trimester: Secondary | ICD-10-CM

## 2020-11-27 NOTE — Patient Instructions (Signed)

## 2020-11-27 NOTE — Progress Notes (Signed)
Routine Prenatal Care Visit  Subjective  Sarah Burke is a 40 y.o. G3P1101 at [redacted]w[redacted]d being seen today for ongoing prenatal care.  She is currently monitored for the following issues for this high-risk pregnancy and has History of Bell's palsy; Situational mixed anxiety and depressive disorder; Class 3 severe obesity due to excess calories with serious comorbidity and body mass index (BMI) of 40.0 to 44.9 in adult [facility]); Vaginal pain; Urinary frequency; Metabolic syndrome; Prediabetes; Hyperlipidemia, mixed; Class 3 obesity with alveolar hypoventilation, serious comorbidity, and body mass index (BMI) of 45.0 to 49.9 in adult [facility]); Cluster headache, not intractable; Inadequate sleep hygiene; Psychophysiological insomnia; Supervision of other high risk pregnancies, first trimester; Chronic hypertension affecting pregnancy; History of preterm delivery, currently pregnant in first trimester; AMA (advanced maternal age) multigravida 35+, unspecified trimester; and Cervical insufficiency in pregnancy, antepartum, first trimester on their problem list.  ----------------------------------------------------------------------------------- Patient reports no complaints.   Contractions: Not present. Vag. Bleeding: None.  Movement: Present. Denies leaking of fluid.  ----------------------------------------------------------------------------------- The following portions of the patient's history were reviewed and updated as appropriate: allergies, current medications, past family history, past medical history, past social history, past surgical history and problem list. Problem list updated.   Objective  Blood pressure 128/74, height 5\' 2"  (1.575 m), weight 243 lb (110.2 kg), last menstrual period 08/08/2020. Pregravid weight 252 lb (114.3 kg) Total Weight Gain -9 lb (-4.082 kg) Urinalysis:      Fetal Status: Fetal Heart Rate (bpm): 145   Movement: Present     General:  Alert, oriented and cooperative.  Patient is in no acute distress.  Skin: Skin is warm and dry. No rash noted.   Cardiovascular: Normal heart rate noted  Respiratory: Normal respiratory effort, no problems with respiration noted  Abdomen: Soft, gravid, appropriate for gestational age. Pain/Pressure: Absent     Pelvic:  Cervical exam performed Dilation: Closed Effacement (%): 0 Station: -3: Cerclage in plae and not under tension  Extremities: Normal range of motion.  Edema: None  Mental Status: Normal mood and affect. Normal behavior. Normal judgment and thought content.     Assessment   40 y.o. G3P1101 at [redacted]w[redacted]d by  05/14/2021, by Ultrasound presenting for routine prenatal visit  Plan   pregnancy 3 Problems (from 10/16/20 to present)     Problem Noted Resolved   Supervision of other high risk pregnancies, first trimester 10/16/2020 by 12/16/2020, MD No   Overview Addendum 10/30/2020  5:10 PM by 11/01/2020, MD     Nursing Staff Provider  Office Location  [facility] Dating  10 wk Conard Novak  Language  English Anatomy US    Flu Vaccine   Genetic Screen  NIPS:   TDaP vaccine    Hgb A1C or  GTT Early : Third trimester :   Covid unvaccinated   LAB RESULTS   Rhogam   Blood Type A/Positive/-- (09/06 0953)   Feeding Plan  Antibody Negative (09/06 0953)  Contraception  Rubella 1.38 (09/06 0953)  Circumcision  RPR Non Reactive (09/06 0953)   Pediatrician   HBsAg Negative (09/06 0953)   Support Person  HIV Non Reactive (09/06 09-03-1971)  Prenatal Classes  Varicella     GBS    BTL Consent     VBAC Consent  Pap  2022    Hgb Electro    Pelvis Tested  CF      SMA        History of preterm delivery- [ ]  16  week cervical length- consider 17-P versus cerclage  CHTN, history of gestational hypertension 10/16/2020- started procardia XL 30 mg   Advanced maternal age Maternal obesity- pregravid BMI 45 History of prediabetes History of HSV- checking serology [ ]  36 wk supression       Chronic hypertension affecting  pregnancy 10/16/2020 by 12/16/2020, MD No   History of preterm delivery, currently pregnant in first trimester 10/16/2020 by 12/16/2020, MD No   Overview Signed 10/30/2020  5:10 PM by 11/01/2020, MD    ~5 months, cervical insufficiecy? [ ]  MFM consult ASAP for possible treatment vs cerclage      AMA (advanced maternal age) multigravida 35+, unspecified trimester 10/16/2020 by , MD No        She has been feeling well since the cerclage placement. No bleeding.  Refraining from intercourse.  Discussed cerclage precautions. She expressed understanding.  Gestational age appropriate obstetric precautions including but not limited to vaginal bleeding, contractions, leaking of fluid and fetal movement were reviewed in detail with the patient.    Return in about 2 weeks (around 12/11/2020) for ROB with MD.  Natale Milch MD [facility], [facility] 11/27/2020, 4:31 PM

## 2020-12-11 ENCOUNTER — Encounter: Payer: Self-pay | Admitting: Obstetrics and Gynecology

## 2020-12-11 ENCOUNTER — Other Ambulatory Visit: Payer: Self-pay

## 2020-12-11 ENCOUNTER — Ambulatory Visit (INDEPENDENT_AMBULATORY_CARE_PROVIDER_SITE_OTHER): Payer: Medicaid Other | Admitting: Obstetrics and Gynecology

## 2020-12-11 VITALS — BP 120/70 | Ht 62.0 in | Wt 243.6 lb

## 2020-12-11 DIAGNOSIS — Z23 Encounter for immunization: Secondary | ICD-10-CM

## 2020-12-11 DIAGNOSIS — Z3A18 18 weeks gestation of pregnancy: Secondary | ICD-10-CM

## 2020-12-11 DIAGNOSIS — O210 Mild hyperemesis gravidarum: Secondary | ICD-10-CM

## 2020-12-11 DIAGNOSIS — O219 Vomiting of pregnancy, unspecified: Secondary | ICD-10-CM

## 2020-12-11 DIAGNOSIS — O0992 Supervision of high risk pregnancy, unspecified, second trimester: Secondary | ICD-10-CM

## 2020-12-11 DIAGNOSIS — Z419 Encounter for procedure for purposes other than remedying health state, unspecified: Secondary | ICD-10-CM | POA: Diagnosis not present

## 2020-12-11 MED ORDER — ONDANSETRON 4 MG PO TBDP: 4.0000 mg | ORAL_TABLET | Freq: Four times a day (QID) | ORAL | 3 refills | Status: DC | PRN

## 2020-12-11 NOTE — Progress Notes (Signed)
Routine Prenatal Care Visit  Subjective  Sarah Burke is a 40 y.o. G3P1101 at [redacted]w[redacted]d being seen today for ongoing prenatal care.  She is currently monitored for the following issues for this high-risk pregnancy and has History of Bell's palsy; Situational mixed anxiety and depressive disorder; Class 3 severe obesity due to excess calories with serious comorbidity and body mass index (BMI) of 40.0 to 44.9 in adult [facility]); Vaginal pain; Urinary frequency; Metabolic syndrome; Prediabetes; Hyperlipidemia, mixed; Class 3 obesity with alveolar hypoventilation, serious comorbidity, and body mass index (BMI) of 45.0 to 49.9 in adult [facility]); Cluster headache, not intractable; Inadequate sleep hygiene; Psychophysiological insomnia; Supervision of other high risk pregnancies, first trimester; Chronic hypertension affecting pregnancy; History of preterm delivery, currently pregnant in first trimester; AMA (advanced maternal age) multigravida 35+, unspecified trimester; and Cervical insufficiency in pregnancy, antepartum, first trimester on their problem list.  ----------------------------------------------------------------------------------- Patient reports  nausea, some lateral thigh numbness after walking .  Denies vaginal bleeding or leakage of fluid. Feeling infant low, denies cramping or contractions.  Contractions: Not present. Vag. Bleeding: None.  Movement: Present. Denies leaking of fluid.  ----------------------------------------------------------------------------------- The following portions of the patient's history were reviewed and updated as appropriate: allergies, current medications, past family history, past medical history, past social history, past surgical history and problem list. Problem list updated.   Objective  Blood pressure 120/70, height 5\' 2"  (1.575 m), weight 243 lb 9.6 oz (110.5 kg), last menstrual period 08/08/2020. Pregravid weight 252 lb (114.3 kg) Total Weight Gain -8 lb  6.4 oz (-3.81 kg) Urinalysis:      Fetal Status: Fetal Heart Rate (bpm): 150   Movement: Present     General:  Alert, oriented and cooperative. Patient is in no acute distress.  Skin: Skin is warm and dry. No rash noted.   Cardiovascular: Normal heart rate noted  Respiratory: Normal respiratory effort, no problems with respiration noted  Abdomen: Soft, gravid, appropriate for gestational age. Pain/Pressure: Absent     Pelvic:  Cervical exam deferred        Extremities: Normal range of motion.  Edema: None  Mental Status: Normal mood and affect. Normal behavior. Normal judgment and thought content.     Assessment   40 y.o. G3P1101 at [redacted]w[redacted]d by  05/14/2021, by Ultrasound presenting for routine prenatal visit  Plan   pregnancy 3 Problems (from 10/16/20 to present)     Problem Noted Resolved   Supervision of other high risk pregnancies, first trimester 10/16/2020 by 12/16/2020, MD No   Overview Addendum 10/30/2020  5:10 PM by 11/01/2020, MD     Nursing Staff Provider  Office Location  [facility] Dating  10 wk Conard Novak  Language  English Anatomy US    Flu Vaccine   Genetic Screen  NIPS:   TDaP vaccine    Hgb A1C or  GTT Early : Third trimester :   Covid unvaccinated   LAB RESULTS   Rhogam   Blood Type A/Positive/-- (09/06 0953)   Feeding Plan  Antibody Negative (09/06 0953)  Contraception  Rubella 1.38 (09/06 0953)  Circumcision  RPR Non Reactive (09/06 0953)   Pediatrician   HBsAg Negative (09/06 0953)   Support Person  HIV Non Reactive (09/06 09-03-1971)  Prenatal Classes  Varicella     GBS    BTL Consent     VBAC Consent  Pap  2022    Hgb Electro    Pelvis Tested  CF  SMA        History of preterm delivery- [ ]  16 week cervical length- consider 17-P versus cerclage  CHTN, history of gestational hypertension 10/16/2020- started procardia XL 30 mg   Advanced maternal age Maternal obesity- pregravid BMI 45 History of prediabetes History of HSV- checking serology  [ ]  36 wk supression       Chronic hypertension affecting pregnancy 10/16/2020 by , MD No   History of preterm delivery, currently pregnant in first trimester 10/16/2020 by Natale Milch, MD No   Overview Signed 10/30/2020  5:10 PM by Natale Milch, MD    ~5 months, cervical insufficiecy? [ ]  MFM consult ASAP for possible treatment vs cerclage      AMA (advanced maternal age) multigravida 35+, unspecified trimester 10/16/2020 by Conard Novak, MD No       Zofran rx for nausea Recommended orthopedic evaluation for thigh numbness related possibly to nerve compression Flu shot today  Gestational age appropriate obstetric precautions including but not limited to vaginal bleeding, contractions, leaking of fluid and fetal movement were reviewed in detail with the patient.    Return in about 2 weeks (around 12/25/2020) for ROB with MD.  12/16/2020 MD [facility], [facility] 12/11/2020, 11:42 AM

## 2020-12-13 ENCOUNTER — Ambulatory Visit: Payer: Medicaid Other

## 2020-12-13 ENCOUNTER — Other Ambulatory Visit: Payer: Self-pay

## 2020-12-13 ENCOUNTER — Encounter: Payer: Self-pay | Admitting: *Deleted

## 2020-12-13 ENCOUNTER — Ambulatory Visit: Payer: Medicaid Other | Attending: Obstetrics and Gynecology | Admitting: *Deleted

## 2020-12-13 ENCOUNTER — Other Ambulatory Visit: Payer: Self-pay | Admitting: *Deleted

## 2020-12-13 ENCOUNTER — Ambulatory Visit (HOSPITAL_BASED_OUTPATIENT_CLINIC_OR_DEPARTMENT_OTHER): Payer: Medicaid Other

## 2020-12-13 VITALS — BP 130/76 | HR 101

## 2020-12-13 DIAGNOSIS — O10911 Unspecified pre-existing hypertension complicating pregnancy, first trimester: Secondary | ICD-10-CM

## 2020-12-13 DIAGNOSIS — Z3A18 18 weeks gestation of pregnancy: Secondary | ICD-10-CM | POA: Insufficient documentation

## 2020-12-13 DIAGNOSIS — O3431 Maternal care for cervical incompetence, first trimester: Secondary | ICD-10-CM

## 2020-12-13 DIAGNOSIS — O10912 Unspecified pre-existing hypertension complicating pregnancy, second trimester: Secondary | ICD-10-CM | POA: Diagnosis not present

## 2020-12-13 DIAGNOSIS — O10919 Unspecified pre-existing hypertension complicating pregnancy, unspecified trimester: Secondary | ICD-10-CM

## 2020-12-13 DIAGNOSIS — O3432 Maternal care for cervical incompetence, second trimester: Secondary | ICD-10-CM | POA: Diagnosis not present

## 2020-12-13 DIAGNOSIS — Z362 Encounter for other antenatal screening follow-up: Secondary | ICD-10-CM

## 2020-12-13 DIAGNOSIS — O09529 Supervision of elderly multigravida, unspecified trimester: Secondary | ICD-10-CM

## 2020-12-13 DIAGNOSIS — O09522 Supervision of elderly multigravida, second trimester: Secondary | ICD-10-CM

## 2020-12-13 DIAGNOSIS — O09292 Supervision of pregnancy with other poor reproductive or obstetric history, second trimester: Secondary | ICD-10-CM | POA: Diagnosis not present

## 2020-12-13 DIAGNOSIS — O10012 Pre-existing essential hypertension complicating pregnancy, second trimester: Secondary | ICD-10-CM

## 2020-12-13 DIAGNOSIS — O0990 Supervision of high risk pregnancy, unspecified, unspecified trimester: Secondary | ICD-10-CM

## 2020-12-13 DIAGNOSIS — O99212 Obesity complicating pregnancy, second trimester: Secondary | ICD-10-CM

## 2020-12-13 DIAGNOSIS — O09891 Supervision of other high risk pregnancies, first trimester: Secondary | ICD-10-CM

## 2020-12-13 DIAGNOSIS — Z6841 Body Mass Index (BMI) 40.0 and over, adult: Secondary | ICD-10-CM

## 2020-12-13 DIAGNOSIS — E669 Obesity, unspecified: Secondary | ICD-10-CM | POA: Diagnosis not present

## 2020-12-14 ENCOUNTER — Telehealth: Payer: Self-pay

## 2020-12-14 NOTE — Telephone Encounter (Signed)
Pt called triage needing to know what to take for cough. She took the flu shot Tuesday and has been feeling bad ever since. Shes aware she can try regular robitussin and Mucinex

## 2020-12-24 ENCOUNTER — Ambulatory Visit (INDEPENDENT_AMBULATORY_CARE_PROVIDER_SITE_OTHER): Payer: Medicaid Other | Admitting: Obstetrics and Gynecology

## 2020-12-24 ENCOUNTER — Encounter: Payer: Self-pay | Admitting: Obstetrics and Gynecology

## 2020-12-24 ENCOUNTER — Other Ambulatory Visit: Payer: Self-pay

## 2020-12-24 VITALS — BP 118/70 | Ht 62.0 in | Wt 244.6 lb

## 2020-12-24 DIAGNOSIS — O0990 Supervision of high risk pregnancy, unspecified, unspecified trimester: Secondary | ICD-10-CM

## 2020-12-24 DIAGNOSIS — Z3A19 19 weeks gestation of pregnancy: Secondary | ICD-10-CM

## 2020-12-24 NOTE — Progress Notes (Signed)
Routine Prenatal Care Visit  Subjective  Sarah Burke is a 40 y.o. G3P1101 at 7867w6d being seen today for ongoing prenatal care.  She is currently monitored for the following issues for this high-risk pregnancy and has History of Bell's palsy; Situational mixed anxiety and depressive disorder; Class 3 severe obesity due to excess calories with serious comorbidity and body mass index (BMI) of 40.0 to 44.9 in adult [facility](HCC); Vaginal pain; Urinary frequency; Metabolic syndrome; Prediabetes; Hyperlipidemia, mixed; Class 3 obesity with alveolar hypoventilation, serious comorbidity, and body mass index (BMI) of 45.0 to 49.9 in adult [facility](HCC); Cluster headache, not intractable; Inadequate sleep hygiene; Psychophysiological insomnia; Supervision of high risk pregnancy, antepartum; Chronic hypertension affecting pregnancy; History of preterm delivery, currently pregnant in first trimester; AMA (advanced maternal age) multigravida 35+, unspecified trimester; and Cervical insufficiency in pregnancy, antepartum, first trimester on their problem list.  ----------------------------------------------------------------------------------- Patient reports no complaints.   Contractions: Not present. Vag. Bleeding: None.  Movement: Present. Denies leaking of fluid.  ----------------------------------------------------------------------------------- The following portions of the patient's history were reviewed and updated as appropriate: allergies, current medications, past family history, past medical history, past social history, past surgical history and problem list. Problem list updated.   Objective  Blood pressure 118/70, height 5\' 2"  (1.575 m), weight 244 lb 9.6 oz (110.9 kg), last menstrual period 08/08/2020. Pregravid weight 252 lb (114.3 kg) Total Weight Gain -7 lb 6.4 oz (-3.357 kg) Urinalysis:      Fetal Status: Fetal Heart Rate (bpm): 145   Movement: Present     General:  Alert, oriented and  cooperative. Patient is in no acute distress.  Skin: Skin is warm and dry. No rash noted.   Cardiovascular: Normal heart rate noted  Respiratory: Normal respiratory effort, no problems with respiration noted  Abdomen: Soft, gravid, appropriate for gestational age. Pain/Pressure: Absent     Pelvic:  Cervical exam deferred        Extremities: Normal range of motion.  Edema: None  Mental Status: Normal mood and affect. Normal behavior. Normal judgment and thought content.     Assessment   40 y.o. G3P1101 at 9267w6d by  05/14/2021, by Ultrasound presenting for routine prenatal visit  Plan   pregnancy 3 Problems (from 10/16/20 to present)     Problem Noted Resolved   Supervision of high risk pregnancy, antepartum 10/16/2020 by Natale MilchSchuman, Savi Lastinger R, MD No   Overview Addendum 12/24/2020 11:17 AM by Natale MilchSchuman, Odetta Forness R, MD     Nursing Staff Provider  Office Location  [facility] Dating  10 wk US  Language  English Anatomy US  complete  Flu Vaccine  12/11/2020 Genetic Screen  NIPS: normal xy  TDaP vaccine    Hgb A1C or  GTT Early :118 Third trimester :   Covid unvaccinated   LAB RESULTS   Rhogam  Not needed Blood Type A/Positive/-- (09/06 0953)   Feeding Plan  Antibody Negative (09/06 0953)  Contraception  Rubella 1.38 (09/06 0953)  Circumcision  RPR Non Reactive (09/06 0953)   Pediatrician   HBsAg Negative (09/06 0953)   Support Person  HIV Non Reactive (09/06 0953)  Prenatal Classes Discussed Varicella immune    GBS    BTL Consent     VBAC Consent  Pap  2022    Hgb Electro    Pelvis Tested  CF neg     SMA ned       History of preterm delivery-cerclage in place  [facility] history of gestational hypertension 10/16/2020- started  procardia XL 30 mg   Advanced maternal age Maternal obesity- pregravid BMI 45 History of prediabetes 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 [ ]  MFM consult ASAP for possible treatment vs cerclage      AMA (advanced maternal age) multigravida 35+, unspecified trimester 10/16/2020 by , MD No       Discussed prenatal classes  Gestational age appropriate obstetric precautions including but not limited to vaginal bleeding, contractions, leaking of fluid and fetal movement were reviewed in detail with the patient.    Return in about 2 weeks (around 01/07/2021) for ROB in person.  Natale Milch MD [facility], [facility] 12/24/2020, 11:18 AM

## 2021-01-08 ENCOUNTER — Other Ambulatory Visit: Payer: Self-pay

## 2021-01-08 ENCOUNTER — Ambulatory Visit (INDEPENDENT_AMBULATORY_CARE_PROVIDER_SITE_OTHER): Payer: Medicaid Other | Admitting: Licensed Practical Nurse

## 2021-01-08 ENCOUNTER — Encounter: Payer: Self-pay | Admitting: Licensed Practical Nurse

## 2021-01-08 VITALS — BP 114/70 | Ht 62.0 in | Wt 248.8 lb

## 2021-01-08 DIAGNOSIS — O0990 Supervision of high risk pregnancy, unspecified, unspecified trimester: Secondary | ICD-10-CM

## 2021-01-08 DIAGNOSIS — Z3A22 22 weeks gestation of pregnancy: Secondary | ICD-10-CM

## 2021-01-08 LAB — POCT URINALYSIS DIPSTICK OB
Glucose, UA: NEGATIVE
POC,PROTEIN,UA: NEGATIVE

## 2021-01-08 NOTE — Progress Notes (Signed)
Routine Prenatal Care Visit  Subjective  Sarah Burke is a 40 y.o. G3P1101 at [redacted]w[redacted]d being seen today for ongoing prenatal care.  She is currently monitored for the following issues for this high-risk pregnancy and has History of Bell's palsy; Situational mixed anxiety and depressive disorder; Class 3 severe obesity due to excess calories with serious comorbidity and body mass index (BMI) of 40.0 to 44.9 in adult [facility]); Vaginal pain; Urinary frequency; Metabolic syndrome; Prediabetes; Hyperlipidemia, mixed; Class 3 obesity with alveolar hypoventilation, serious comorbidity, and body mass index (BMI) of 45.0 to 49.9 in adult [facility]); Cluster headache, not intractable; Inadequate sleep hygiene; Psychophysiological insomnia; Supervision of high risk pregnancy, antepartum; Chronic hypertension affecting pregnancy; History of preterm delivery, currently pregnant in first trimester; AMA (advanced maternal age) multigravida 47+, unspecified trimester; and Cervical insufficiency in pregnancy, antepartum, first trimester on their problem list.  ----------------------------------------------------------------------------------- Patient reports no complaints.  Feeling good, reports mood is good.  Has decreased appetite is eating small amounts of food regularly.  Reviewed BF experienced, nursed x 2 months with last child-stopped d/t supply concerns. Needs pump.  Contractions: Not present. Vag. Bleeding: None.  Movement: Present. Leaking Fluid denies.  ----------------------------------------------------------------------------------- The following portions of the patient's history were reviewed and updated as appropriate: allergies, current medications, past family history, past medical history, past social history, past surgical history and problem list. Problem list updated.  Objective  Blood pressure 114/70, height 5\' 2"  (1.575 m), weight 248 lb 12.8 oz (112.9 kg), last menstrual period 08/08/2020. Pregravid  weight 252 lb (114.3 kg) Total Weight Gain -3 lb 3.2 oz (-1.452 kg) Urinalysis: Urine Protein    Urine Glucose    Fetal Status:     Movement: Present     General:  Alert, oriented and cooperative. Patient is in no acute distress.  Skin: Skin is warm and dry. No rash noted.   Cardiovascular: Normal heart rate noted  Respiratory: Normal respiratory effort, no problems with respiration noted  Abdomen: Soft, gravid, appropriate for gestational age. Pain/Pressure: Absent     Pelvic:  Cervical exam deferred        Extremities: Normal range of motion.     Mental Status: Normal mood and affect. Normal behavior. Normal judgment and thought content.   Assessment   40 y.o. G3P1101 at [redacted]w[redacted]d by  05/14/2021, by Ultrasound presenting for routine prenatal visit  Plan   pregnancy 3 Problems (from 10/16/20 to present)    Problem Noted Resolved   Supervision of high risk pregnancy, antepartum 10/16/2020 by Homero Fellers, MD No   Overview Addendum 01/08/2021  8:31 AM by Allen Derry, [place] Staff Provider  Office Location  [facility] Dating  10 wk [place]  Language  English Anatomy US  complete  Flu Vaccine  12/11/2020 Genetic Screen  NIPS: normal xy  TDaP vaccine    Hgb A1C or  GTT Early :118 Third trimester :   Covid unvaccinated   LAB RESULTS   Rhogam  Not needed Blood Type A/Positive/-- (09/06 0953)   Feeding Plan breast Antibody Negative (09/06 0953)  Contraception  Rubella 1.38 (09/06 0953)  Circumcision  RPR Non Reactive (09/06 0953)   Pediatrician   HBsAg Negative (09/06 0953)   Support Person  HIV Non Reactive (09/06 [id number])  Prenatal Classes Discussed Varicella immune    GBS    BTL Consent needs    VBAC Consent  Pap  2022    Hgb Electro    Pelvis Tested  CF neg     SMA ned        History of preterm delivery-cerclage in place  [facility], history of gestational hypertension 10/16/2020- started procardia XL 30 mg, currently not taking    Advanced maternal age Maternal  obesity- pregravid BMI 45 History of prediabetes 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 [ ]  MFM consult ASAP for possible treatment vs cerclage      AMA (advanced maternal age) multigravida 35+, unspecified trimester 10/16/2020 by , MD No       Preterm labor symptoms and general obstetric precautions including but not limited to vaginal bleeding, contractions, leaking of fluid and fetal movement were reviewed in detail with the patient. Please refer to After Visit Summary for other counseling recommendations.   Return in about 2 weeks (around 01/22/2021) for ROB.  Fu Natale Milch for views of the heart Dec 1.   Growth scan every 4 weeks  [place], CNM  Jan, [facility]  01/08/21  8:35 AM

## 2021-01-10 ENCOUNTER — Encounter: Payer: Self-pay | Admitting: *Deleted

## 2021-01-10 ENCOUNTER — Other Ambulatory Visit: Payer: Self-pay

## 2021-01-10 ENCOUNTER — Ambulatory Visit: Payer: Medicaid Other | Attending: Obstetrics and Gynecology

## 2021-01-10 ENCOUNTER — Other Ambulatory Visit: Payer: Self-pay | Admitting: *Deleted

## 2021-01-10 ENCOUNTER — Ambulatory Visit: Payer: Medicaid Other | Admitting: *Deleted

## 2021-01-10 VITALS — BP 125/58 | HR 74

## 2021-01-10 DIAGNOSIS — O09891 Supervision of other high risk pregnancies, first trimester: Secondary | ICD-10-CM

## 2021-01-10 DIAGNOSIS — O09522 Supervision of elderly multigravida, second trimester: Secondary | ICD-10-CM | POA: Diagnosis not present

## 2021-01-10 DIAGNOSIS — O0990 Supervision of high risk pregnancy, unspecified, unspecified trimester: Secondary | ICD-10-CM | POA: Insufficient documentation

## 2021-01-10 DIAGNOSIS — Z362 Encounter for other antenatal screening follow-up: Secondary | ICD-10-CM | POA: Insufficient documentation

## 2021-01-10 DIAGNOSIS — O10912 Unspecified pre-existing hypertension complicating pregnancy, second trimester: Secondary | ICD-10-CM | POA: Diagnosis not present

## 2021-01-10 DIAGNOSIS — Z419 Encounter for procedure for purposes other than remedying health state, unspecified: Secondary | ICD-10-CM | POA: Diagnosis not present

## 2021-01-10 DIAGNOSIS — Z8759 Personal history of other complications of pregnancy, childbirth and the puerperium: Secondary | ICD-10-CM

## 2021-01-10 DIAGNOSIS — O09529 Supervision of elderly multigravida, unspecified trimester: Secondary | ICD-10-CM

## 2021-01-10 DIAGNOSIS — O99212 Obesity complicating pregnancy, second trimester: Secondary | ICD-10-CM

## 2021-01-10 DIAGNOSIS — Z3A22 22 weeks gestation of pregnancy: Secondary | ICD-10-CM | POA: Diagnosis not present

## 2021-01-10 DIAGNOSIS — O10919 Unspecified pre-existing hypertension complicating pregnancy, unspecified trimester: Secondary | ICD-10-CM | POA: Insufficient documentation

## 2021-01-10 DIAGNOSIS — O10012 Pre-existing essential hypertension complicating pregnancy, second trimester: Secondary | ICD-10-CM

## 2021-01-10 DIAGNOSIS — Z6841 Body Mass Index (BMI) 40.0 and over, adult: Secondary | ICD-10-CM | POA: Insufficient documentation

## 2021-01-10 DIAGNOSIS — O3432 Maternal care for cervical incompetence, second trimester: Secondary | ICD-10-CM

## 2021-01-22 ENCOUNTER — Ambulatory Visit (INDEPENDENT_AMBULATORY_CARE_PROVIDER_SITE_OTHER): Payer: Medicaid Other | Admitting: Obstetrics

## 2021-01-22 ENCOUNTER — Other Ambulatory Visit: Payer: Self-pay

## 2021-01-22 VITALS — BP 126/80 | Wt 247.0 lb

## 2021-01-22 DIAGNOSIS — O99210 Obesity complicating pregnancy, unspecified trimester: Secondary | ICD-10-CM

## 2021-01-22 DIAGNOSIS — O0990 Supervision of high risk pregnancy, unspecified, unspecified trimester: Secondary | ICD-10-CM

## 2021-01-22 DIAGNOSIS — Z3A24 24 weeks gestation of pregnancy: Secondary | ICD-10-CM

## 2021-01-22 NOTE — Progress Notes (Signed)
No vb. No lof.  

## 2021-01-22 NOTE — Progress Notes (Signed)
Routine Prenatal Care Visit  Subjective  Sarah Burke is a 40 y.o. G3P1101 at [redacted]w[redacted]d being seen today for ongoing prenatal care.  She is currently monitored for the following issues for this high-risk pregnancy and has History of Bell's palsy; Situational mixed anxiety and depressive disorder; Class 3 severe obesity due to excess calories with serious comorbidity and body mass index (BMI) of 40.0 to 44.9 in adult [facility]); Vaginal pain; Urinary frequency; Metabolic syndrome; Prediabetes; Hyperlipidemia, mixed; Class 3 obesity with alveolar hypoventilation, serious comorbidity, and body mass index (BMI) of 45.0 to 49.9 in adult [facility]); Cluster headache, not intractable; Inadequate sleep hygiene; Psychophysiological insomnia; Supervision of high risk pregnancy, antepartum; Chronic hypertension affecting pregnancy; History of preterm delivery, currently pregnant in first trimester; AMA (advanced maternal age) multigravida 88+, unspecified trimester; and Cervical insufficiency in pregnancy, antepartum, first trimester on their problem list.  ----------------------------------------------------------------------------------- Patient reports no complaints.  Some occasional pressure, but no contractions or LOF or vaginal bleeding.  .  .   Sarah Burke Fluid denies.  ----------------------------------------------------------------------------------- The following portions of the patient's history were reviewed and updated as appropriate: allergies, current medications, past family history, past medical history, past social history, past surgical history and problem list. Problem list updated.  Objective  Blood pressure 126/80, weight 247 lb (112 kg), last menstrual period 08/08/2020. Pregravid weight 252 lb (114.3 kg) Total Weight Gain -5 lb (-2.268 kg) Urinalysis: Urine Protein    Urine Glucose    Fetal Status:           General:  Alert, oriented and cooperative. Patient is in no acute distress.  Skin:  Skin is warm and dry. No rash noted.   Cardiovascular: Normal heart rate noted  Respiratory: Normal respiratory effort, no problems with respiration noted  Abdomen: Soft, gravid, appropriate for gestational age.       Pelvic:  Cervical exam deferred        Extremities: Normal range of motion.  Edema: None  Mental Status: Normal mood and affect. Normal behavior. Normal judgment and thought content.   Assessment   40 y.o. G3P1101 at [redacted]w[redacted]d by  05/14/2021, by Ultrasound presenting for routine prenatal visit  Plan   pregnancy 3 Problems (from 10/16/20 to present)    Problem Noted Resolved   Supervision of high risk pregnancy, antepartum 10/16/2020 by Homero Fellers, MD No   Overview Addendum 01/08/2021  8:31 AM by Allen Derry, [place] Staff Provider  Office Location  [facility] Dating  10 wk [place]  Language  English Anatomy US  complete  Flu Vaccine  12/11/2020 Genetic Screen  NIPS: normal xy  TDaP vaccine    Hgb A1C or  GTT Early :118 Third trimester :   Covid unvaccinated   LAB RESULTS   Rhogam  Not needed Blood Type A/Positive/-- (09/06 0953)   Feeding Plan breast Antibody Negative (09/06 0953)  Contraception  Rubella 1.38 (09/06 0953)  Circumcision  RPR Non Reactive (09/06 0953)   Pediatrician   HBsAg Negative (09/06 0953)   Support Person  HIV Non Reactive (09/06 0953)  Prenatal Classes Discussed Varicella immune    GBS    BTL Consent needs    VBAC Consent  Pap  2022    Hgb Electro    Pelvis Tested  CF neg     SMA ned        History of preterm delivery-cerclage in place  [facility], history of gestational hypertension 10/16/2020- started procardia XL 30 mg  Advanced maternal age Maternal obesity- pregravid BMI 45 History of prediabetes 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 [ ]  MFM consult ASAP for possible treatment vs cerclage      AMA (advanced maternal age) multigravida 35+, unspecified trimester 10/16/2020 by , MD No       pre labor symptoms and general obstetric precautions including but not limited to vaginal bleeding, contractions, leaking of fluid and fetal movement were reviewed in detail with the patient. Please refer to After Visit Summary for other counseling recommendations.   Return in about 2 weeks (around 02/05/2021) for  wtih Natale Milch or Harris if possible. Discuss 1 hr GTT at 28 wks., return OB.  Maternity belt suggested for her. She continues to see [facility] every 2 weeks and then [facility] the other q 2 weeks. Has never been able to tolerate drinking the Glucola- advised her to discuss with MDs. Anesthesia consult ordered for her.  Jerene Pitch, CNM  01/22/2021 10:33 AM    Mirna Mires, CNM  01/22/2021 10:30 AM

## 2021-02-06 ENCOUNTER — Encounter: Payer: Self-pay | Admitting: Obstetrics and Gynecology

## 2021-02-06 ENCOUNTER — Ambulatory Visit (INDEPENDENT_AMBULATORY_CARE_PROVIDER_SITE_OTHER): Payer: Medicaid Other | Admitting: Obstetrics and Gynecology

## 2021-02-06 ENCOUNTER — Other Ambulatory Visit: Payer: Self-pay

## 2021-02-06 ENCOUNTER — Other Ambulatory Visit: Payer: Medicaid Other

## 2021-02-06 VITALS — BP 120/70 | Ht 62.0 in | Wt 247.6 lb

## 2021-02-06 DIAGNOSIS — Z3A26 26 weeks gestation of pregnancy: Secondary | ICD-10-CM

## 2021-02-06 DIAGNOSIS — O0990 Supervision of high risk pregnancy, unspecified, unspecified trimester: Secondary | ICD-10-CM

## 2021-02-06 MED ORDER — ACCU-CHEK GUIDE ME W/DEVICE KIT: 1.0000 | PACK | Freq: Four times a day (QID) | 0 refills | Status: DC

## 2021-02-06 MED ORDER — ACCU-CHEK SOFTCLIX LANCETS MISC: 1.0000 | Freq: Four times a day (QID) | 0 refills | Status: DC

## 2021-02-06 MED ORDER — ACCU-CHEK GUIDE VI STRP: 1.0000 | ORAL_STRIP | Freq: Four times a day (QID) | 0 refills | Status: DC

## 2021-02-06 NOTE — Patient Instructions (Signed)
Blood Glucose Monitoring, Adult Monitoring your blood sugar (glucose) is an important part of managing your diabetes. Blood glucose monitoring involves checking your blood glucose as often as directed and keeping a log or record of your results over time. Checking your blood glucose regularly and keeping a blood glucose log can: Help you and your health care provider adjust your diabetes management plan as needed, including your medicines or insulin. Help you understand how food, exercise, illnesses, and medicines affect your blood glucose. Let you know what your blood glucose is at any time. You can quickly find out if you have low blood glucose (hypoglycemia) or high blood glucose (hyperglycemia). Your health care provider will set individualized treatment goals for you. Your goals will be based on your age, other medical conditions you have, and how you respond to diabetes treatment. Generally, the goal of treatment is to maintain the following blood glucose levels: Before meals (preprandial): 80-130 mg/dL (4.4-7.2 mmol/L). After meals (postprandial): below 180 mg/dL (10 mmol/L). A1C level: less than 7%. Supplies needed: Blood glucose meter. Test strips for your meter. Each meter has its own strips. You must use the strips that came with your meter. A needle to prick your finger (lancet). Do not use a lancet more than one time. A device that holds the lancet (lancing device). A journal or log book to write down your results. How to check your blood glucose Checking your blood glucose  Wash your hands for at least 20 seconds with soap and water. Prick the side of your finger (not the tip) with the lancet. Do not use the same finger consecutively. Gently rub the finger until a small drop of blood appears. Follow instructions that come with your meter for inserting the test strip, applying blood to the strip, and using your blood glucose meter. Write down your result and any notes in your  log. Using alternative sites Some meters allow you to use areas of your body other than your finger (alternative sites) to test your blood. The most common alternative sites are the forearm, the thigh, and the palm of your hand. Alternative sites may not be as accurate as the fingers because blood flow is slower in those areas. This means that the result you get may be delayed, and it may be different from the result that you would get from your finger. Use the finger only, and do not use alternative sites, if: You think you have hypoglycemia. You sometimes do not know that your blood glucose is getting low (hypoglycemia unawareness). General tips and recommendations Blood glucose log  Every time you check your blood glucose, write down your result. Also write down any notes about things that may be affecting your blood glucose, such as your diet and exercise for the day. This information can help you and your health care provider: Look for patterns in your blood glucose over time. Adjust your diabetes management plan as needed. Check if your meter allows you to download your records to a computer or if there is an app for the meter. Most glucose meters store a record of glucose readings in the meter. If you have type 1 diabetes: Check your blood glucose 4 or more times a day if you are on intensive insulin therapy with multiple daily injections (MDI) or if you are using an insulin pump. Check your blood glucose: Before every meal and snack. Before bedtime. Also check your blood glucose: If you have symptoms of hypoglycemia. After treating low blood glucose.   Before doing activities that create a risk for injury, like driving or using machinery. Before and after exercise. Two hours after a meal. Occasionally between 2:00 a.m. and 3:00 a.m., as directed. You may need to check your blood glucose more often, 6-10 times per day, if: You have diabetes that is not well controlled. You are  ill. You have a history of severe hypoglycemia. You have hypoglycemia unawareness. If you have type 2 diabetes: Check your blood glucose 2 or more times a day if you take insulin or other diabetes medicines. Check your blood glucose 4 or more times a day if you are on intensive insulin therapy. Occasionally, you may also need to check your glucose between 2:00 a.m. and 3:00 a.m., as directed. Also check your blood glucose: Before and after exercise. Before doing activities that create a risk for injury, like driving or using machinery. You may need to check your blood glucose more often if: Your medicine is being adjusted. Your diabetes is not well controlled. You are ill. General tips Make sure you always have your supplies with you. After you use a few boxes of test strips, adjust (calibrate) your blood glucose meter by following instructions that came with your meter. If you have questions or need help, all blood glucose meters have a 24-hour hotline phone number available that you can call. Also contact your health care provider with questions or concerns you may have. Where to find more information The American Diabetes Association: www.diabetes.org The Association of Diabetes Care & Education Specialists: www.diabeteseducator.org Contact a health care provider if: Your blood glucose is at or above 240 mg/dL (13.3 mmol/L) for 2 days in a row. You have been sick or have had a fever for 2 days or longer, and you are not getting better. You have any of the following problems for more than 6 hours: You cannot eat or drink. You have nausea or vomiting. You have diarrhea. Get help right away if: Your blood glucose is lower than 54 mg/dL (3 mmol/L). You become confused, or you have trouble thinking clearly. You have difficulty breathing. You have moderate or large ketone levels in your urine. These symptoms may represent a serious problem that is an emergency. Do not wait to see if the  symptoms will go away. Get medical help right away. Call your local emergency services (911 in the [place] Do not drive yourself to the hospital. Summary Monitoring your blood glucose is an important part of managing your diabetes. Blood glucose monitoring involves checking your blood glucose as often as directed and keeping a log or record of your results over time. Your health care provider will set individualized treatment goals for you. Your goals will be based on your age, other medical conditions you have, and how you respond to diabetes treatment. Every time you check your blood glucose, write down your result. Also, write down any notes about things that may be affecting your blood glucose, such as your diet and exercise for the day. This information is not intended to replace advice given to you by your health care provider. Make sure you discuss any questions you have with your health care provider. Document Revised: 10/26/2019 Document Reviewed: 10/26/2019 Elsevier Patient Education  2022 Elsevier Inc.  

## 2021-02-06 NOTE — Progress Notes (Signed)
Routine Prenatal Care Visit  Subjective  Sarah Burke is a 40 y.o. G3P1101 at [redacted]w[redacted]d being seen today for ongoing prenatal care.  She is currently monitored for the following issues for this high-risk pregnancy and has History of Bell's palsy; Situational mixed anxiety and depressive disorder; Class 3 severe obesity due to excess calories with serious comorbidity and body mass index (BMI) of 40.0 to 44.9 in adult [facility]); Vaginal pain; Urinary frequency; Metabolic syndrome; Prediabetes; Hyperlipidemia, mixed; Class 3 obesity with alveolar hypoventilation, serious comorbidity, and body mass index (BMI) of 45.0 to 49.9 in adult [facility]); Cluster headache, not intractable; Inadequate sleep hygiene; Psychophysiological insomnia; Supervision of high risk pregnancy, antepartum; Chronic hypertension affecting pregnancy; History of preterm delivery, currently pregnant in first trimester; AMA (advanced maternal age) multigravida 35+, unspecified trimester; and Cervical insufficiency in pregnancy, antepartum, first trimester on their problem list.  ----------------------------------------------------------------------------------- Patient reports  vaginal irritation .   Contractions: Irregular. Vag. Bleeding: None.  Movement: Present. Denies leaking of fluid.  ----------------------------------------------------------------------------------- The following portions of the patient's history were reviewed and updated as appropriate: allergies, current medications, past family history, past medical history, past social history, past surgical history and problem list. Problem list updated.   Objective  Blood pressure 120/70, height 5\' 2"  (1.575 m), weight 247 lb 9.6 oz (112.3 kg), last menstrual period 08/08/2020. Pregravid weight 252 lb (114.3 kg) Total Weight Gain -4 lb 6.4 oz (-1.996 kg) Urinalysis:      Fetal Status: Fetal Heart Rate (bpm): 150   Movement: Present     General:  Alert, oriented and  cooperative. Patient is in no acute distress.  Skin: Skin is warm and dry. No rash noted.   Cardiovascular: Normal heart rate noted  Respiratory: Normal respiratory effort, no problems with respiration noted  Abdomen: Soft, gravid, appropriate for gestational age. Pain/Pressure: Present     Pelvic:  Cervical exam deferred        Extremities: Normal range of motion.  Edema: None  Mental Status: Normal mood and affect. Normal behavior. Normal judgment and thought content.     Assessment   40 y.o. G3P1101 at [redacted]w[redacted]d by  05/14/2021, by Ultrasound presenting for routine prenatal visit  Plan   pregnancy 3 Problems (from 10/16/20 to present)     Problem Noted Resolved   Supervision of high risk pregnancy, antepartum 10/16/2020 by 12/16/2020, MD No   Overview Addendum 01/08/2021  8:31 AM by 01/10/2021, CNM     Nursing Staff Provider  Office Location  [facility] Dating  10 wk [place]  Language  English Anatomy US  complete  Flu Vaccine  12/11/2020 Genetic Screen  NIPS: normal xy  TDaP vaccine    Hgb A1C or  GTT Early :118 Third trimester :   Covid unvaccinated   LAB RESULTS   Rhogam  Not needed Blood Type A/Positive/-- (09/06 0953)   Feeding Plan breast Antibody Negative (09/06 0953)  Contraception  Rubella 1.38 (09/06 0953)  Circumcision  RPR Non Reactive (09/06 0953)   Pediatrician   HBsAg Negative (09/06 0953)   Support Person  HIV Non Reactive (09/06 0953)  Prenatal Classes Discussed Varicella immune    GBS    BTL Consent needs    VBAC Consent  Pap  2022    Hgb Electro    Pelvis Tested  CF neg     SMA ned       History of preterm delivery-cerclage in place  [facility], history of gestational hypertension  10/16/2020- started procardia XL 30 mg   Advanced maternal age Maternal obesity- pregravid BMI 45 History of prediabetes 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 [ ]  MFM consult ASAP for possible treatment vs cerclage      AMA (advanced maternal age) multigravida 35+, unspecified trimester 10/16/2020 by Homero Fellers, MD No        Given rx for glucometer and log to check glucose levels for 2 weeks instead of 1 GTT which patient reports she vomits.  Vulvar inspection showed no HSV lesions. Area or concern is sebaceous cyst- recommended sitz baths   Gestational age appropriate obstetric precautions including but not limited to vaginal bleeding, contractions, leaking of fluid and fetal movement were reviewed in detail with the patient.    Return in about 2 weeks (around 02/20/2021) for [place] with MD- labs.  Homero Fellers MD [facility] 02/06/2021, 9:30 AM

## 2021-02-07 ENCOUNTER — Ambulatory Visit: Payer: Medicaid Other | Attending: Obstetrics

## 2021-02-07 ENCOUNTER — Other Ambulatory Visit: Payer: Medicaid Other

## 2021-02-07 ENCOUNTER — Other Ambulatory Visit: Payer: Self-pay | Admitting: *Deleted

## 2021-02-07 ENCOUNTER — Ambulatory Visit: Payer: Medicaid Other | Admitting: *Deleted

## 2021-02-07 VITALS — BP 122/63 | HR 73

## 2021-02-07 DIAGNOSIS — O09529 Supervision of elderly multigravida, unspecified trimester: Secondary | ICD-10-CM

## 2021-02-07 DIAGNOSIS — O10912 Unspecified pre-existing hypertension complicating pregnancy, second trimester: Secondary | ICD-10-CM

## 2021-02-07 DIAGNOSIS — O09293 Supervision of pregnancy with other poor reproductive or obstetric history, third trimester: Secondary | ICD-10-CM | POA: Diagnosis not present

## 2021-02-07 DIAGNOSIS — Z8759 Personal history of other complications of pregnancy, childbirth and the puerperium: Secondary | ICD-10-CM | POA: Insufficient documentation

## 2021-02-07 DIAGNOSIS — O09523 Supervision of elderly multigravida, third trimester: Secondary | ICD-10-CM | POA: Diagnosis not present

## 2021-02-07 DIAGNOSIS — O09891 Supervision of other high risk pregnancies, first trimester: Secondary | ICD-10-CM | POA: Diagnosis not present

## 2021-02-07 DIAGNOSIS — O99212 Obesity complicating pregnancy, second trimester: Secondary | ICD-10-CM | POA: Insufficient documentation

## 2021-02-07 DIAGNOSIS — O3432 Maternal care for cervical incompetence, second trimester: Secondary | ICD-10-CM | POA: Diagnosis not present

## 2021-02-07 DIAGNOSIS — O10013 Pre-existing essential hypertension complicating pregnancy, third trimester: Secondary | ICD-10-CM | POA: Diagnosis not present

## 2021-02-07 DIAGNOSIS — O10919 Unspecified pre-existing hypertension complicating pregnancy, unspecified trimester: Secondary | ICD-10-CM | POA: Diagnosis not present

## 2021-02-07 DIAGNOSIS — O3433 Maternal care for cervical incompetence, third trimester: Secondary | ICD-10-CM | POA: Diagnosis not present

## 2021-02-07 DIAGNOSIS — O09522 Supervision of elderly multigravida, second trimester: Secondary | ICD-10-CM

## 2021-02-07 DIAGNOSIS — O0990 Supervision of high risk pregnancy, unspecified, unspecified trimester: Secondary | ICD-10-CM | POA: Diagnosis not present

## 2021-02-07 DIAGNOSIS — O99213 Obesity complicating pregnancy, third trimester: Secondary | ICD-10-CM | POA: Diagnosis not present

## 2021-02-07 DIAGNOSIS — G510 Bell's palsy: Secondary | ICD-10-CM

## 2021-02-10 DIAGNOSIS — Z419 Encounter for procedure for purposes other than remedying health state, unspecified: Secondary | ICD-10-CM | POA: Diagnosis not present

## 2021-02-15 ENCOUNTER — Other Ambulatory Visit: Payer: Self-pay

## 2021-02-15 ENCOUNTER — Telehealth: Payer: Self-pay

## 2021-02-15 ENCOUNTER — Ambulatory Visit (INDEPENDENT_AMBULATORY_CARE_PROVIDER_SITE_OTHER): Payer: Medicaid Other | Admitting: Obstetrics and Gynecology

## 2021-02-15 ENCOUNTER — Encounter: Payer: Self-pay | Admitting: Obstetrics and Gynecology

## 2021-02-15 VITALS — BP 124/68 | Ht 62.0 in | Wt 245.4 lb

## 2021-02-15 DIAGNOSIS — R875 Abnormal microbiological findings in specimens from female genital organs: Secondary | ICD-10-CM

## 2021-02-15 DIAGNOSIS — O0990 Supervision of high risk pregnancy, unspecified, unspecified trimester: Secondary | ICD-10-CM

## 2021-02-15 DIAGNOSIS — N771 Vaginitis, vulvitis and vulvovaginitis in diseases classified elsewhere: Secondary | ICD-10-CM

## 2021-02-15 LAB — POCT URINALYSIS DIPSTICK OB
Glucose, UA: NEGATIVE
POC,PROTEIN,UA: NEGATIVE

## 2021-02-15 NOTE — Telephone Encounter (Signed)
Patient reports she was just sitting and was feeling some pressure. She coughed and a bunch of fluid came out. Inquiring what to do. Cb#[id number]

## 2021-02-15 NOTE — Telephone Encounter (Signed)
Spoke w/patient. She reports this is the second time this has happened today. She is unable to determine if it is urine or not. She is not having any cramping/contractions. She does have a cerclage. Appointment scheduled for 4:15 today for evaluation given time of day does not allow for patient to wear pad for 1 hour to monitor.

## 2021-02-15 NOTE — Progress Notes (Signed)
Routine Prenatal Care Visit  Subjective  Sarah Burke is a 41 y.o. G3P1101 at [redacted]w[redacted]d being seen today for ongoing prenatal care.  She is currently monitored for the following issues for this high-risk pregnancy and has History of Bell's palsy; Situational mixed anxiety and depressive disorder; Class 3 severe obesity due to excess calories with serious comorbidity and body mass index (BMI) of 40.0 to 44.9 in adult [facility]); Vaginal pain; Urinary frequency; Metabolic syndrome; Prediabetes; Hyperlipidemia, mixed; Class 3 obesity with alveolar hypoventilation, serious comorbidity, and body mass index (BMI) of 45.0 to 49.9 in adult [facility]); Cluster headache, not intractable; Inadequate sleep hygiene; Psychophysiological insomnia; Supervision of high risk pregnancy, antepartum; Chronic hypertension affecting pregnancy; History of preterm delivery, currently pregnant in first trimester; AMA (advanced maternal age) multigravida 55+, unspecified trimester; and Cervical insufficiency in pregnancy, antepartum, first trimester on their problem list.  ----------------------------------------------------------------------------------- Patient reports that twice today she had gushes of clear fluid when at home.  One of the gushes happen when she coughed. Contractions: Irregular. Vag. Bleeding: None.  Movement: Present. Denies leaking of fluid.  ----------------------------------------------------------------------------------- The following portions of the patient's history were reviewed and updated as appropriate: allergies, current medications, past family history, past medical history, past social history, past surgical history and problem list. Problem list updated.   Objective  Blood pressure 124/68, height 5\' 2"  (1.575 m), weight 245 lb 6.4 oz (111.3 kg), last menstrual period 08/08/2020. Pregravid weight 252 lb (114.3 kg) Total Weight Gain -6 lb 9.6 oz (-2.994 kg) Urinalysis:      Fetal Status: Fetal  Heart Rate (bpm): 145   Movement: Present     General:  Alert, oriented and cooperative. Patient is in no acute distress.  Skin: Skin is warm and dry. No rash noted.   Cardiovascular: Normal heart rate noted  Respiratory: Normal respiratory effort, no problems with respiration noted  Abdomen: Soft, gravid, appropriate for gestational age. Pain/Pressure: Present     Pelvic:  Cervical exam performed Dilation: Closed Effacement (%): 0 Station: -3  Extremities: Normal range of motion.  Edema: None  Mental Status: Normal mood and affect. Normal behavior. Normal judgment and thought content.     Assessment   40 y.o. G3P1101 at [redacted]w[redacted]d by  05/14/2021, by Ultrasound presenting for work-in prenatal visit  Plan   pregnancy 3 Problems (from 10/16/20 to present)     Problem Noted Resolved   Supervision of high risk pregnancy, antepartum 10/16/2020 by Homero Fellers, MD No   Overview Addendum 01/08/2021  8:31 AM by Allen Derry, [place] Staff Provider  Office Location  [facility] Dating  10 wk [place]  Language  English Anatomy US  complete  Flu Vaccine  12/11/2020 Genetic Screen  NIPS: normal xy  TDaP vaccine    Hgb A1C or  GTT Early :118 Third trimester :   Covid unvaccinated   LAB RESULTS   Rhogam  Not needed Blood Type A/Positive/-- (09/06 0953)   Feeding Plan breast Antibody Negative (09/06 0953)  Contraception  Rubella 1.38 (09/06 0953)  Circumcision  RPR Non Reactive (09/06 0953)   Pediatrician   HBsAg Negative (09/06 0953)   Support Person  HIV Non Reactive (09/06 [id number])  Prenatal Classes Discussed Varicella immune    GBS    BTL Consent needs    VBAC Consent  Pap  2022    Hgb Electro    Pelvis Tested  CF neg     SMA ned  History of preterm delivery-cerclage in place  [facility], history of gestational hypertension 10/16/2020- started procardia XL 30 mg   Advanced maternal age Maternal obesity- pregravid BMI 45 History of prediabetes 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 [ ]  MFM consult ASAP for possible treatment vs cerclage      AMA (advanced maternal age) multigravida 35+, unspecified trimester 10/16/2020 by Homero Fellers, MD No        [place] negative No pooling of fluid seen Cerclage was not on tension  Offer that the patient could go to labor and delivery for ROM plus testing however she declined. No evidence of rupture of membranes at this time.  Gestational age appropriate obstetric precautions including but not limited to vaginal bleeding, contractions, leaking of fluid and fetal movement were reviewed in detail with the patient.    Return for ROB as planned.  Homero Fellers MD [facility] 02/15/2021, 5:07 PM

## 2021-02-19 NOTE — Telephone Encounter (Signed)
Patient seen by CRS in clinic 02/15/21. drl

## 2021-02-20 ENCOUNTER — Ambulatory Visit (INDEPENDENT_AMBULATORY_CARE_PROVIDER_SITE_OTHER): Payer: Medicaid Other | Admitting: Obstetrics and Gynecology

## 2021-02-20 ENCOUNTER — Encounter: Payer: Self-pay | Admitting: Obstetrics and Gynecology

## 2021-02-20 ENCOUNTER — Other Ambulatory Visit: Payer: Self-pay

## 2021-02-20 VITALS — BP 115/70 | Ht 62.0 in | Wt 248.8 lb

## 2021-02-20 DIAGNOSIS — O0990 Supervision of high risk pregnancy, unspecified, unspecified trimester: Secondary | ICD-10-CM

## 2021-02-20 DIAGNOSIS — O09529 Supervision of elderly multigravida, unspecified trimester: Secondary | ICD-10-CM

## 2021-02-20 DIAGNOSIS — O10919 Unspecified pre-existing hypertension complicating pregnancy, unspecified trimester: Secondary | ICD-10-CM

## 2021-02-20 DIAGNOSIS — Z3A28 28 weeks gestation of pregnancy: Secondary | ICD-10-CM

## 2021-02-20 LAB — NUSWAB BV AND CANDIDA, NAA
BVAB 2: HIGH Score — AB
Candida albicans, NAA: NEGATIVE
Candida glabrata, NAA: NEGATIVE
Megasphaera 1: HIGH Score — AB

## 2021-02-20 NOTE — Progress Notes (Signed)
Routine Prenatal Care Visit  Subjective  Sarah Burke is a 41 y.o. G3P1101 at [redacted]w[redacted]d being seen today for ongoing prenatal care.  She is currently monitored for the following issues for this high-risk pregnancy and has History of Bell's palsy; Situational mixed anxiety and depressive disorder; Class 3 severe obesity due to excess calories with serious comorbidity and body mass index (BMI) of 40.0 to 44.9 in adult [facility]); Vaginal pain; Urinary frequency; Metabolic syndrome; Prediabetes; Hyperlipidemia, mixed; Class 3 obesity with alveolar hypoventilation, serious comorbidity, and body mass index (BMI) of 45.0 to 49.9 in adult [facility]); Cluster headache, not intractable; Inadequate sleep hygiene; Psychophysiological insomnia; Supervision of high risk pregnancy, antepartum; Chronic hypertension affecting pregnancy; History of preterm delivery, currently pregnant in first trimester; AMA (advanced maternal age) multigravida 35+, unspecified trimester; and Cervical insufficiency in pregnancy, antepartum, first trimester on their problem list.  ----------------------------------------------------------------------------------- Patient reports no complaints.   Contractions: Not present. Vag. Bleeding: None.  Movement: Present. Denies leaking of fluid.  ----------------------------------------------------------------------------------- The following portions of the patient's history were reviewed and updated as appropriate: allergies, current medications, past family history, past medical history, past social history, past surgical history and problem list. Problem list updated.   Objective  Blood pressure 115/70, height 5\' 2"  (1.575 m), weight 248 lb 12.8 oz (112.9 kg), last menstrual period 08/08/2020. Pregravid weight 252 lb (114.3 kg) Total Weight Gain -3 lb 3.2 oz (-1.452 kg) Urinalysis:      Fetal Status: Fetal Heart Rate (bpm): 150 Fundal Height: 34 cm Movement: Present     General:  Alert,  oriented and cooperative. Patient is in no acute distress.  Skin: Skin is warm and dry. No rash noted.   Cardiovascular: Normal heart rate noted  Respiratory: Normal respiratory effort, no problems with respiration noted  Abdomen: Soft, gravid, appropriate for gestational age. Pain/Pressure: Absent     Pelvic:  Cervical exam deferred        Extremities: Normal range of motion.  Edema: None  Mental Status: Normal mood and affect. Normal behavior. Normal judgment and thought content.     Assessment   41 y.o. G3P1101 at [redacted]w[redacted]d by  05/14/2021, by Ultrasound presenting for routine prenatal visit  Plan   pregnancy 3 Problems (from 10/16/20 to present)     Problem Noted Resolved   Supervision of high risk pregnancy, antepartum 10/16/2020 by Natale Milch, MD No   Overview Addendum 02/20/2021  1:46 PM by Natale Milch, MD     Nursing Staff Provider  Office Location  [facility] Dating  10 wk [place]  Language  English Anatomy US  complete  Flu Vaccine  12/11/2020 Genetic Screen  NIPS: normal xy  TDaP vaccine    Hgb A1C or  GTT Early :118 Third trimester : glucose log normal  Covid unvaccinated   LAB RESULTS   Rhogam  Not needed Blood Type A/Positive/-- (09/06 0953)   Feeding Plan breast Antibody Negative (09/06 0953)  Contraception  Rubella 1.38 (09/06 0953)  Circumcision  RPR Non Reactive (09/06 0953)   Pediatrician   HBsAg Negative (09/06 0953)   Support Person  HIV Non Reactive (09/06 0953)  Prenatal Classes Discussed Varicella immune    GBS    BTL Consent needs    VBAC Consent  Pap  2022    Hgb Electro    Pelvis Tested  CF neg     SMA ned       History of preterm delivery-cerclage in place  [facility] [place], history  of gestational hypertension 10/16/2020- started procardia XL 30 mg   Advanced maternal age Maternal obesity- pregravid BMI 45 History of prediabetes 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 [ ]  MFM consult ASAP for possible treatment vs cerclage      AMA (advanced maternal age) multigravida 35+, unspecified trimester 10/16/2020 by Homero Fellers, MD No        Glucose log reviewed for 28 week testing, normal- no further screening     Gestational age appropriate obstetric precautions including but not limited to vaginal bleeding, contractions, leaking of fluid and fetal movement were reviewed in detail with the patient.    Return in about 2 weeks (around 03/06/2021) for HROB with [place] every 2 weeks x 4 visits.  Homero Fellers MD [facility] 02/20/2021, 1:46 PM

## 2021-02-25 ENCOUNTER — Other Ambulatory Visit: Payer: Self-pay | Admitting: Obstetrics and Gynecology

## 2021-02-25 DIAGNOSIS — N760 Acute vaginitis: Secondary | ICD-10-CM

## 2021-02-25 DIAGNOSIS — B9689 Other specified bacterial agents as the cause of diseases classified elsewhere: Secondary | ICD-10-CM

## 2021-02-25 MED ORDER — METRONIDAZOLE 0.75 % VA GEL: 1.0000 | Freq: Every day | VAGINAL | 0 refills | Status: DC

## 2021-03-08 ENCOUNTER — Encounter: Payer: Self-pay | Admitting: Obstetrics and Gynecology

## 2021-03-08 ENCOUNTER — Other Ambulatory Visit: Payer: Self-pay

## 2021-03-08 ENCOUNTER — Ambulatory Visit (INDEPENDENT_AMBULATORY_CARE_PROVIDER_SITE_OTHER): Payer: Medicaid Other | Admitting: Obstetrics and Gynecology

## 2021-03-08 VITALS — BP 118/70 | Ht 62.0 in | Wt 248.6 lb

## 2021-03-08 DIAGNOSIS — Z23 Encounter for immunization: Secondary | ICD-10-CM

## 2021-03-08 DIAGNOSIS — O0990 Supervision of high risk pregnancy, unspecified, unspecified trimester: Secondary | ICD-10-CM

## 2021-03-08 DIAGNOSIS — O09529 Supervision of elderly multigravida, unspecified trimester: Secondary | ICD-10-CM

## 2021-03-08 DIAGNOSIS — Z3A30 30 weeks gestation of pregnancy: Secondary | ICD-10-CM

## 2021-03-08 DIAGNOSIS — O10919 Unspecified pre-existing hypertension complicating pregnancy, unspecified trimester: Secondary | ICD-10-CM

## 2021-03-08 LAB — POCT URINALYSIS DIPSTICK OB
Glucose, UA: NEGATIVE
POC,PROTEIN,UA: NEGATIVE

## 2021-03-08 NOTE — Progress Notes (Signed)
Routine Prenatal Care Visit  Subjective  Sarah Burke is a 42 y.o. G3P1101 at [redacted]w[redacted]d being seen today for ongoing prenatal care.  She is currently monitored for the following issues for this high-risk pregnancy and has History of Bell's palsy; Situational mixed anxiety and depressive disorder; Class 3 severe obesity due to excess calories with serious comorbidity and body mass index (BMI) of 40.0 to 44.9 in adult [facility]); Vaginal pain; Urinary frequency; Metabolic syndrome; Prediabetes; Hyperlipidemia, mixed; Class 3 obesity with alveolar hypoventilation, serious comorbidity, and body mass index (BMI) of 45.0 to 49.9 in adult [facility]); Cluster headache, not intractable; Inadequate sleep hygiene; Psychophysiological insomnia; Supervision of high risk pregnancy, antepartum; Chronic hypertension affecting pregnancy; History of preterm delivery, currently pregnant in first trimester; AMA (advanced maternal age) multigravida 80+, unspecified trimester; and Cervical insufficiency in pregnancy, antepartum, first trimester on their problem list.  ----------------------------------------------------------------------------------- Patient reports no complaints.   Contractions: Irregular. Vag. Bleeding: None.  Movement: Present. Denies leaking of fluid.  ----------------------------------------------------------------------------------- The following portions of the patient's history were reviewed and updated as appropriate: allergies, current medications, past family history, past medical history, past social history, past surgical history and problem list. Problem list updated.   Objective  Blood pressure 118/70, height 5\' 2"  (1.575 m), weight 248 lb 9.6 oz (112.8 kg), last menstrual period 08/08/2020. Pregravid weight 252 lb (114.3 kg) Total Weight Gain -3 lb 6.4 oz (-1.542 kg) Urinalysis:      Fetal Status: Fetal Heart Rate (bpm): 140 Fundal Height: 34 cm Movement: Present     General:  Alert,  oriented and cooperative. Patient is in no acute distress.  Skin: Skin is warm and dry. No rash noted.   Cardiovascular: Normal heart rate noted  Respiratory: Normal respiratory effort, no problems with respiration noted  Abdomen: Soft, gravid, appropriate for gestational age. Pain/Pressure: Present     Pelvic:  Cervical exam deferred        Extremities: Normal range of motion.  Edema: None  Mental Status: Normal mood and affect. Normal behavior. Normal judgment and thought content.     Assessment   41 y.o. G3P1101 at [redacted]w[redacted]d by  05/14/2021, by Ultrasound presenting for routine prenatal visit  Plan   pregnancy 3 Problems (from 10/16/20 to present)     Problem Noted Resolved   Supervision of high risk pregnancy, antepartum 10/16/2020 by Homero Fellers, MD No   Overview Addendum 02/20/2021  1:46 PM by Homero Fellers, MD     Nursing Staff Provider  Office Location  [facility] Dating  10 wk [place]  Language  English Anatomy US  complete  Flu Vaccine  12/11/2020 Genetic Screen  NIPS: normal xy  TDaP vaccine    Hgb A1C or  GTT Early :118 Third trimester : glucose log normal  Covid unvaccinated   LAB RESULTS   Rhogam  Not needed Blood Type A/Positive/-- (09/06 0953)   Feeding Plan breast Antibody Negative (09/06 0953)  Contraception  Rubella 1.38 (09/06 0953)  Circumcision  RPR Non Reactive (09/06 0953)   Pediatrician   HBsAg Negative (09/06 0953)   Support Person  HIV Non Reactive (09/06 0953)  Prenatal Classes Discussed Varicella immune    GBS    BTL Consent needs    VBAC Consent  Pap  2022    Hgb Electro    Pelvis Tested  CF neg     SMA ned       History of preterm delivery-cerclage in place  [facility], history of  gestational hypertension 10/16/2020- started procardia XL 30 mg   Advanced maternal age Maternal obesity- pregravid BMI 45 History of prediabetes 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 [ ]  MFM consult ASAP for possible treatment vs cerclage      AMA (advanced maternal age) multigravida 35+, unspecified trimester 10/16/2020 by Homero Fellers, MD No       Reports she discontinued Procardia per MFM in December Labs for thrid trimester today TDAP today  Gestational age appropriate obstetric precautions including but not limited to vaginal bleeding, contractions, leaking of fluid and fetal movement were reviewed in detail with the patient.    Return in about 2 weeks (around 03/22/2021) for HROB.  Homero Fellers MD [facility], [facility] 03/08/2021, 9:47 AM

## 2021-03-09 LAB — CBC WITH DIFFERENTIAL
Basophils Absolute: 0 10*3/uL (ref 0.0–0.2)
Basos: 0 %
EOS (ABSOLUTE): 0.1 10*3/uL (ref 0.0–0.4)
Eos: 1 %
Hematocrit: 32.7 % — ABNORMAL LOW (ref 34.0–46.6)
Hemoglobin: 10.7 g/dL — ABNORMAL LOW (ref 11.1–15.9)
Immature Grans (Abs): 0.1 10*3/uL (ref 0.0–0.1)
Immature Granulocytes: 1 %
Lymphocytes Absolute: 1.1 10*3/uL (ref 0.7–3.1)
Lymphs: 15 %
MCH: 26.9 pg (ref 26.6–33.0)
MCHC: 32.7 g/dL (ref 31.5–35.7)
MCV: 82 fL (ref 79–97)
Monocytes Absolute: 0.7 10*3/uL (ref 0.1–0.9)
Monocytes: 10 %
Neutrophils Absolute: 5 10*3/uL (ref 1.4–7.0)
Neutrophils: 73 %
RBC: 3.98 x10E6/uL (ref 3.77–5.28)
RDW: 13.2 % (ref 11.7–15.4)
WBC: 6.9 10*3/uL (ref 3.4–10.8)

## 2021-03-09 LAB — HIV ANTIBODY (ROUTINE TESTING W REFLEX): HIV Screen 4th Generation wRfx: NONREACTIVE

## 2021-03-09 LAB — RPR: RPR Ser Ql: NONREACTIVE

## 2021-03-13 ENCOUNTER — Encounter: Payer: Medicaid Other | Admitting: Obstetrics and Gynecology

## 2021-03-13 DIAGNOSIS — Z419 Encounter for procedure for purposes other than remedying health state, unspecified: Secondary | ICD-10-CM | POA: Diagnosis not present

## 2021-03-14 ENCOUNTER — Encounter: Payer: Self-pay | Admitting: *Deleted

## 2021-03-14 ENCOUNTER — Other Ambulatory Visit: Payer: Self-pay | Admitting: *Deleted

## 2021-03-14 ENCOUNTER — Ambulatory Visit: Payer: Medicaid Other | Admitting: *Deleted

## 2021-03-14 ENCOUNTER — Other Ambulatory Visit: Payer: Self-pay

## 2021-03-14 ENCOUNTER — Ambulatory Visit: Payer: Medicaid Other | Attending: Obstetrics

## 2021-03-14 VITALS — BP 124/66 | HR 79

## 2021-03-14 DIAGNOSIS — O10912 Unspecified pre-existing hypertension complicating pregnancy, second trimester: Secondary | ICD-10-CM | POA: Diagnosis not present

## 2021-03-14 DIAGNOSIS — O10919 Unspecified pre-existing hypertension complicating pregnancy, unspecified trimester: Secondary | ICD-10-CM | POA: Insufficient documentation

## 2021-03-14 DIAGNOSIS — Z3A31 31 weeks gestation of pregnancy: Secondary | ICD-10-CM | POA: Diagnosis not present

## 2021-03-14 DIAGNOSIS — O3433 Maternal care for cervical incompetence, third trimester: Secondary | ICD-10-CM

## 2021-03-14 DIAGNOSIS — O0990 Supervision of high risk pregnancy, unspecified, unspecified trimester: Secondary | ICD-10-CM | POA: Diagnosis not present

## 2021-03-14 DIAGNOSIS — O09529 Supervision of elderly multigravida, unspecified trimester: Secondary | ICD-10-CM

## 2021-03-14 DIAGNOSIS — O99212 Obesity complicating pregnancy, second trimester: Secondary | ICD-10-CM | POA: Diagnosis not present

## 2021-03-14 DIAGNOSIS — O99352 Diseases of the nervous system complicating pregnancy, second trimester: Secondary | ICD-10-CM | POA: Diagnosis not present

## 2021-03-14 DIAGNOSIS — O09891 Supervision of other high risk pregnancies, first trimester: Secondary | ICD-10-CM | POA: Insufficient documentation

## 2021-03-14 DIAGNOSIS — O09522 Supervision of elderly multigravida, second trimester: Secondary | ICD-10-CM | POA: Diagnosis not present

## 2021-03-14 DIAGNOSIS — O3432 Maternal care for cervical incompetence, second trimester: Secondary | ICD-10-CM | POA: Diagnosis not present

## 2021-03-14 DIAGNOSIS — O09523 Supervision of elderly multigravida, third trimester: Secondary | ICD-10-CM

## 2021-03-14 DIAGNOSIS — O10913 Unspecified pre-existing hypertension complicating pregnancy, third trimester: Secondary | ICD-10-CM

## 2021-03-14 DIAGNOSIS — G510 Bell's palsy: Secondary | ICD-10-CM | POA: Diagnosis not present

## 2021-03-14 DIAGNOSIS — O10012 Pre-existing essential hypertension complicating pregnancy, second trimester: Secondary | ICD-10-CM

## 2021-03-14 DIAGNOSIS — O09293 Supervision of pregnancy with other poor reproductive or obstetric history, third trimester: Secondary | ICD-10-CM

## 2021-03-14 DIAGNOSIS — Z6841 Body Mass Index (BMI) 40.0 and over, adult: Secondary | ICD-10-CM

## 2021-03-21 ENCOUNTER — Other Ambulatory Visit: Payer: Self-pay

## 2021-03-21 ENCOUNTER — Encounter: Payer: Self-pay | Admitting: Obstetrics and Gynecology

## 2021-03-21 ENCOUNTER — Ambulatory Visit (INDEPENDENT_AMBULATORY_CARE_PROVIDER_SITE_OTHER): Payer: Medicaid Other | Admitting: Obstetrics and Gynecology

## 2021-03-21 VITALS — BP 120/70 | Ht 62.0 in | Wt 246.0 lb

## 2021-03-21 DIAGNOSIS — O10919 Unspecified pre-existing hypertension complicating pregnancy, unspecified trimester: Secondary | ICD-10-CM

## 2021-03-21 DIAGNOSIS — Z6841 Body Mass Index (BMI) 40.0 and over, adult: Secondary | ICD-10-CM

## 2021-03-21 DIAGNOSIS — Z3A32 32 weeks gestation of pregnancy: Secondary | ICD-10-CM

## 2021-03-21 DIAGNOSIS — A6004 Herpesviral vulvovaginitis: Secondary | ICD-10-CM

## 2021-03-21 DIAGNOSIS — O3431 Maternal care for cervical incompetence, first trimester: Secondary | ICD-10-CM

## 2021-03-21 DIAGNOSIS — O09529 Supervision of elderly multigravida, unspecified trimester: Secondary | ICD-10-CM

## 2021-03-21 MED ORDER — VALACYCLOVIR HCL 500 MG PO TABS: 500.0000 mg | ORAL_TABLET | Freq: Two times a day (BID) | ORAL | 3 refills | Status: DC

## 2021-03-21 NOTE — Patient Instructions (Signed)
Magnesium 500 mg daily B2 400 mg daily Coenzyme q10 150 mg daily Ferrous Sulfate 65 mg elemental iron daily

## 2021-03-21 NOTE — Progress Notes (Signed)
Routine Prenatal Care Visit  Subjective  Sarah Burke is a 41 y.o. G3P1101 at 2937w2d being seen today for ongoing prenatal care.  She is currently monitored for the following issues for this high-risk pregnancy and has History of Bell's palsy; Situational mixed anxiety and depressive disorder; Class 3 severe obesity due to excess calories with serious comorbidity and body mass index (BMI) of 40.0 to 44.9 in adult [facility](HCC); Vaginal pain; Urinary frequency; Metabolic syndrome; Prediabetes; Hyperlipidemia, mixed; Class 3 obesity with alveolar hypoventilation, serious comorbidity, and body mass index (BMI) of 45.0 to 49.9 in adult [facility](HCC); Cluster headache, not intractable; Inadequate sleep hygiene; Psychophysiological insomnia; Supervision of high risk pregnancy, antepartum; Chronic hypertension affecting pregnancy; History of preterm delivery, currently pregnant in first trimester; AMA (advanced maternal age) multigravida 35+, unspecified trimester; and Cervical insufficiency in pregnancy, antepartum, first trimester on their problem list.  ----------------------------------------------------------------------------------- Patient reports occasional headaches. Normal BP today and Nromal BP on her home monitor.  Contractions: Irritability. Vag. Bleeding: None.  Movement: Present. Denies leaking of fluid.  ----------------------------------------------------------------------------------- The following portions of the patient's history were reviewed and updated as appropriate: allergies, current medications, past family history, past medical history, past social history, past surgical history and problem list. Problem list updated.   Objective  Blood pressure 120/70, height 5\' 2"  (1.575 m), weight 246 lb (111.6 kg), last menstrual period 08/08/2020. Pregravid weight 252 lb (114.3 kg) Total Weight Gain -6 lb (-2.722 kg) Urinalysis:      Fetal Status: Fetal Heart Rate (bpm): 140 Fundal Height: 37 cm  Movement: Present     General:  Alert, oriented and cooperative. Patient is in no acute distress.  Skin: Skin is warm and dry. No rash noted.   Cardiovascular: Normal heart rate noted  Respiratory: Normal respiratory effort, no problems with respiration noted  Abdomen: Soft, gravid, appropriate for gestational age. Pain/Pressure: Present     Pelvic:  Cervical exam deferred        Extremities: Normal range of motion.  Edema: None  Mental Status: Normal mood and affect. Normal behavior. Normal judgment and thought content.     Assessment   41 y.o. G3P1101 at 2937w2d by  05/14/2021, by Ultrasound presenting for routine prenatal visit  Plan   pregnancy 3 Problems (from 10/16/20 to present)     Problem Noted Resolved   Supervision of high risk pregnancy, antepartum 10/16/2020 by Natale MilchSchuman, Aaliyah Cancro R, MD No   Overview Addendum 03/08/2021  9:45 AM by Natale MilchSchuman, Aaleah Hirsch R, MD     Nursing Staff Provider  Office Location  [facility] Dating  10 wk US  Language  English Anatomy US  complete  Flu Vaccine  12/11/2020 Genetic Screen  NIPS: normal xy  TDaP vaccine   03/08/2021 Hgb A1C or  GTT Early :118 Third trimester : glucose log normal  Covid unvaccinated   LAB RESULTS   Rhogam  Not needed Blood Type A/Positive/-- (09/06 0953)   Feeding Plan breast Antibody Negative (09/06 0953)  Contraception  Rubella 1.38 (09/06 0953)  Circumcision  RPR Non Reactive (09/06 0953)   Pediatrician   HBsAg Negative (09/06 0953)   Support Person  HIV Non Reactive (09/06 0953)  Prenatal Classes Discussed Varicella immune    GBS    BTL Consent needs    VBAC Consent  Pap  2022    Hgb Electro    Pelvis Tested  CF neg     SMA ned       History of preterm delivery-cerclage in  place  [facility], history of gestational hypertension 10/16/2020- started procardia XL 30 mg 01/2021- Discontinued Procardia per MFM    Advanced maternal age Maternal obesity- pregravid BMI 45 History of prediabetes 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 [ ]  MFM consult ASAP for possible treatment vs cerclage      AMA (advanced maternal age) multigravida 35+, unspecified trimester 10/16/2020 by Homero Fellers, MD No        Gestational age appropriate obstetric precautions including but not limited to vaginal bleeding, contractions, leaking of fluid and fetal movement were reviewed in detail with the patient.    Return in about 2 weeks (around 04/04/2021) for ROB in person.  Homero Fellers MD [facility] 03/21/2021, 4:37 PM

## 2021-03-25 ENCOUNTER — Other Ambulatory Visit: Payer: Self-pay | Admitting: Obstetrics & Gynecology

## 2021-03-25 DIAGNOSIS — Z6841 Body Mass Index (BMI) 40.0 and over, adult: Secondary | ICD-10-CM

## 2021-03-27 ENCOUNTER — Encounter: Payer: Medicaid Other | Admitting: Obstetrics and Gynecology

## 2021-04-03 ENCOUNTER — Encounter: Payer: Medicaid Other | Admitting: Obstetrics & Gynecology

## 2021-04-04 ENCOUNTER — Ambulatory Visit: Payer: Medicaid Other | Admitting: *Deleted

## 2021-04-04 ENCOUNTER — Ambulatory Visit (INDEPENDENT_AMBULATORY_CARE_PROVIDER_SITE_OTHER): Payer: Medicaid Other | Admitting: Obstetrics and Gynecology

## 2021-04-04 ENCOUNTER — Encounter: Payer: Self-pay | Admitting: Obstetrics and Gynecology

## 2021-04-04 ENCOUNTER — Other Ambulatory Visit: Payer: Self-pay | Admitting: Obstetrics and Gynecology

## 2021-04-04 ENCOUNTER — Ambulatory Visit (HOSPITAL_BASED_OUTPATIENT_CLINIC_OR_DEPARTMENT_OTHER): Payer: Medicaid Other | Admitting: *Deleted

## 2021-04-04 ENCOUNTER — Other Ambulatory Visit: Payer: Self-pay

## 2021-04-04 ENCOUNTER — Ambulatory Visit: Payer: Medicaid Other | Attending: Obstetrics and Gynecology

## 2021-04-04 VITALS — BP 126/84 | Wt 245.0 lb

## 2021-04-04 VITALS — BP 128/69 | HR 89

## 2021-04-04 DIAGNOSIS — O0990 Supervision of high risk pregnancy, unspecified, unspecified trimester: Secondary | ICD-10-CM | POA: Diagnosis not present

## 2021-04-04 DIAGNOSIS — O3433 Maternal care for cervical incompetence, third trimester: Secondary | ICD-10-CM

## 2021-04-04 DIAGNOSIS — O283 Abnormal ultrasonic finding on antenatal screening of mother: Secondary | ICD-10-CM | POA: Diagnosis not present

## 2021-04-04 DIAGNOSIS — O99210 Obesity complicating pregnancy, unspecified trimester: Secondary | ICD-10-CM | POA: Insufficient documentation

## 2021-04-04 DIAGNOSIS — O09891 Supervision of other high risk pregnancies, first trimester: Secondary | ICD-10-CM

## 2021-04-04 DIAGNOSIS — O10913 Unspecified pre-existing hypertension complicating pregnancy, third trimester: Secondary | ICD-10-CM

## 2021-04-04 DIAGNOSIS — O10919 Unspecified pre-existing hypertension complicating pregnancy, unspecified trimester: Secondary | ICD-10-CM

## 2021-04-04 DIAGNOSIS — O10013 Pre-existing essential hypertension complicating pregnancy, third trimester: Secondary | ICD-10-CM | POA: Diagnosis not present

## 2021-04-04 DIAGNOSIS — Z3A34 34 weeks gestation of pregnancy: Secondary | ICD-10-CM | POA: Diagnosis not present

## 2021-04-04 DIAGNOSIS — O09529 Supervision of elderly multigravida, unspecified trimester: Secondary | ICD-10-CM

## 2021-04-04 DIAGNOSIS — Z6841 Body Mass Index (BMI) 40.0 and over, adult: Secondary | ICD-10-CM | POA: Diagnosis not present

## 2021-04-04 DIAGNOSIS — O09523 Supervision of elderly multigravida, third trimester: Secondary | ICD-10-CM

## 2021-04-04 DIAGNOSIS — O09293 Supervision of pregnancy with other poor reproductive or obstetric history, third trimester: Secondary | ICD-10-CM | POA: Diagnosis not present

## 2021-04-04 NOTE — Progress Notes (Signed)
No vb. No lof.  

## 2021-04-04 NOTE — Progress Notes (Signed)
° °  PRENATAL VISIT NOTE  Subjective:  Sarah Burke is a 41 y.o. G3P1101 at [redacted]w[redacted]d being seen today for ongoing prenatal care.  She is currently monitored for the following issues for this high-risk pregnancy and has History of Bell's palsy; Situational mixed anxiety and depressive disorder; Class 3 severe obesity due to excess calories with serious comorbidity and body mass index (BMI) of 40.0 to 44.9 in adult [facility]); Vaginal pain; Urinary frequency; Metabolic syndrome; Prediabetes; Hyperlipidemia, mixed; Class 3 obesity with alveolar hypoventilation, serious comorbidity, and body mass index (BMI) of 45.0 to 49.9 in adult [facility]); Cluster headache, not intractable; Inadequate sleep hygiene; Psychophysiological insomnia; Supervision of high risk pregnancy, antepartum; Chronic hypertension affecting pregnancy; History of preterm delivery, currently pregnant in first trimester; AMA (advanced maternal age) multigravida 69+, unspecified trimester; Cervical insufficiency in pregnancy, antepartum, first trimester; and Maternal obesity affecting pregnancy, antepartum on their problem list.  Patient reports no complaints.  Contractions: Irritability. Vag. Bleeding: None.  Movement: Present. Denies leaking of fluid.   The following portions of the patient's history were reviewed and updated as appropriate: allergies, current medications, past family history, past medical history, past social history, past surgical history and problem list.   Objective:   Vitals:   04/04/21 1108  BP: 126/84  Weight: 245 lb (111.1 kg)    Fetal Status: Fetal Heart Rate (bpm): 140 Fundal Height: 36 cm Movement: Present     General:  Alert, oriented and cooperative. Patient is in no acute distress.  Skin: Skin is warm and dry. No rash noted.   Cardiovascular: Normal heart rate noted  Respiratory: Normal respiratory effort, no problems with respiration noted  Abdomen: Soft, gravid, appropriate for gestational age.   Pain/Pressure: Present     Pelvic: Cervical exam deferred        Extremities: Normal range of motion.     Mental Status: Normal mood and affect. Normal behavior. Normal judgment and thought content.   Assessment and Plan:  Pregnancy: G3P1101 at [redacted]w[redacted]d 1. Supervision of high risk pregnancy, antepartum Patient is doing well without complaints Desires BTL- medicaid form signed today Cultures next visit  2. Chronic hypertension affecting pregnancy Stable without medication Follow up growth ultrasound as scheduled by MFM Continue ASA  3. History of preterm delivery, currently pregnant in first trimester Cerclage in place to be removed at next visit  4. AMA (advanced maternal age) multigravida 35+, unspecified trimester   5. Maternal obesity affecting pregnancy, antepartum Ob consult scheduled  Preterm labor symptoms and general obstetric precautions including but not limited to vaginal bleeding, contractions, leaking of fluid and fetal movement were reviewed in detail with the patient. Please refer to After Visit Summary for other counseling recommendations.   Return in about 2 weeks (around 04/18/2021) for in person, ROB, High risk, GBS and cerclage removal next visit.  Future Appointments  Date Time Provider [place]  04/04/2021  1:15 PM [facility] NURSE [facility]  04/04/2021  1:30 PM [facility] US3 [facility]  04/09/2021  9:00 AM [facility] CONSULT [facility] None  04/11/2021  9:15 AM Gilman Schmidt, Stefanie Libel, MD [facility] None  04/11/2021  1:30 PM [facility] NURSE [facility]  04/11/2021  1:45 PM [facility] US4 [facility]  04/18/2021  8:30 AM [facility] NURSE [facility]  04/18/2021  8:45 AM [facility] US4 [facility]  04/25/2021  8:30 AM [facility] NURSE [facility]  04/25/2021  8:45 AM [facility] US4 [facility] [place]    Chapel Silverthorn, MD

## 2021-04-04 NOTE — Procedures (Signed)
Sarah Burke 1980/04/25 [redacted]w[redacted]d  Fetus A Non-Stress Test Interpretation for 04/04/21  Indication: Unsatisfactory BPP  Fetal Heart Rate A Mode: External Baseline Rate (A): 140 bpm Variability: Moderate Accelerations: 15 x 15 Decelerations: None Multiple birth?: No  Uterine Activity Mode: Palpation, Toco Contraction Frequency (min): none Resting Tone Palpated: Relaxed  Interpretation (Fetal Testing) Nonstress Test Interpretation: Reactive Overall Impression: Reassuring for gestational age Comments: Dr. Grace Bushy reviewed tracing

## 2021-04-09 ENCOUNTER — Other Ambulatory Visit: Payer: Self-pay

## 2021-04-09 ENCOUNTER — Other Ambulatory Visit
Admission: RE | Admit: 2021-04-09 | Discharge: 2021-04-09 | Disposition: A | Discharge status: Home / Self Care | Payer: Medicaid Other | Source: Ambulatory Visit | Attending: Anesthesiology | Admitting: Anesthesiology

## 2021-04-09 NOTE — Consult Note (Signed)
[facility] Anesthesia Consultation  AYVA VEILLEUX FOY:[id number] DOB: Aug 29, 1980 DOA: 04/09/2021 PCP: Hubbard Hartshorn, FNP   Requesting physician: Gilman Schmidt Date of consultation: 04/09/21 Reason for consultation: Obesity during pregnancy  CHIEF COMPLAINT:  Obesity during pregnancy  HISTORY OF PRESENT ILLNESS: Sarah Burke  is a 41 y.o. female with a known history of HTN, childhood asthma, former smoker (quit 15 years ago), prediabetes, GERD with pregnancy, anemia with hx blood transfusions postpartum, and class 3 obesity.  PAST MEDICAL HISTORY:   Past Medical History:  Diagnosis Date   Anxiety    Back pain    Chest pain    Constipation    Edema, lower extremity    GERD (gastroesophageal reflux disease)    High cholesterol    History of PCOS    Hypertension    Infertility, female    Pre-diabetes    Prediabetes    Shortness of breath     PAST SURGICAL HISTORY:  Past Surgical History:  Procedure Laterality Date   CERVICAL CERCLAGE N/A 11/15/2020   Procedure: CERCLAGE CERVICAL;  Surgeon: Homero Fellers, MD;  Location: [facility] ORS;  Service: Gynecology;  Laterality: N/A;   DILATION AND CURETTAGE OF UTERUS      SOCIAL HISTORY:  Social History   Tobacco Use   Smoking status: Never   Smokeless tobacco: Never  Substance Use Topics   Alcohol use: Not Currently    Comment: occassionally, but not whilepreg    FAMILY HISTORY:  Family History  Problem Relation Age of Onset   Hypertension Mother    Obesity Mother    Diabetes Brother    Hypertension Father    Hyperlipidemia Father    Heart disease Father    Hypertension Maternal Grandmother    Hypercholesterolemia Maternal Grandmother    Breast cancer Maternal Grandmother    Breast cancer Maternal Aunt    Breast cancer Maternal Aunt     DRUG ALLERGIES: No Known Allergies  REVIEW OF SYSTEMS:   RESPIRATORY: No cough, shortness of breath, wheezing.  CARDIOVASCULAR: No chest  pain, orthopnea, edema.  HEMATOLOGY: No anemia, easy bruising or bleeding SKIN: No rash or lesion. NEUROLOGIC: No tingling, numbness, weakness.  PSYCHIATRY: No anxiety or depression.   MEDICATIONS AT HOME:  Prior to Admission medications   Medication Sig Start Date End Date Taking? Authorizing Provider  Accu-Chek Softclix Lancets lancets 1 each by Other route 4 (four) times daily. 02/06/21   Schuman, Stefanie Libel, MD  acetaminophen (TYLENOL) 500 MG tablet Take 500 mg by mouth every 8 (eight) hours as needed for moderate pain.    [provider]  aspirin EC 81 MG tablet Take 81 mg by mouth daily. Swallow whole.    [provider]  Blood Glucose Monitoring Suppl (ACCU-CHEK GUIDE ME) w/Device KIT 1 each by Does not apply route 4 (four) times daily. 02/06/21   Schuman, Christanna R, MD  glucose blood (ACCU-CHEK GUIDE) test strip 1 each by Other route in the morning, at noon, in the evening, and at bedtime. Check blood glucose four times a day: fasting and 2 hours after breakfast, lunch, and dinner 02/06/21   Schuman, Christanna R, MD  metroNIDAZOLE (METROGEL) 0.75 % vaginal gel Place 1 Applicatorful vaginally at bedtime. Apply one applicatorful to vagina at bedtime for 5 days Patient not taking: Reported on 03/08/2021 02/25/21   Homero Fellers, MD  Prenatal Vit-Fe Fumarate-FA (PRENATAL MULTIVITAMIN) TABS tablet Take 1 tablet by mouth daily.    [provider]  valACYclovir (VALTREX) 500 MG tablet Take 1 tablet (500 mg total) by mouth 2 (two) times daily. 03/21/21   Homero Fellers, MD      PHYSICAL EXAMINATION:   VITAL SIGNS: Last menstrual period 08/08/2020.  GENERAL:  41 y.o.-year-old patient no acute distress.  HEENT: Head atraumatic, normocephalic. Oropharynx and nasopharynx clear. MP III, TM distance >3 cm, normal mouth opening; missing upper left molar. LUNGS: No use of accessory muscles of respiration.   EXTREMITIES: No pedal edema, cyanosis, or  clubbing.  NEUROLOGIC: normal gait PSYCHIATRIC: The patient is alert and oriented x 3.  SKIN: No obvious rash, lesion, or ulcer.    IMPRESSION AND PLAN:   Sarah Burke  is a 41 y.o. female presenting with obesity during pregnancy. BMI is currently 45 at [redacted] weeks gestation.   We discussed analgesic options during labor including epidural analgesia. Discussed that in obesity there can be increased difficulty with epidural placement or even failure of successful epidural. We also discussed that even after successful epidural placement there is increased risk of catheter migration out of the epidural space that would require catheter replacement. Discussed use of epidural vs spinal vs GA if cesarean delivery is required. Discussed increased risk of difficult intubation during pregnancy should an emergency cesarean delivery be required.   This patient is appropriate for anesthetic care at [facility] in [place].  We discussed the limitations of a community hospital including but not limited to staffing, imaging, medications, and blood products.  The patient is aware of these limitations.  All questions answered and concerns addressed.

## 2021-04-10 DIAGNOSIS — Z419 Encounter for procedure for purposes other than remedying health state, unspecified: Secondary | ICD-10-CM | POA: Diagnosis not present

## 2021-04-11 ENCOUNTER — Other Ambulatory Visit: Payer: Self-pay

## 2021-04-11 ENCOUNTER — Ambulatory Visit (INDEPENDENT_AMBULATORY_CARE_PROVIDER_SITE_OTHER): Payer: Medicaid Other | Admitting: Obstetrics and Gynecology

## 2021-04-11 ENCOUNTER — Encounter: Payer: Self-pay | Admitting: Obstetrics and Gynecology

## 2021-04-11 ENCOUNTER — Ambulatory Visit: Payer: Medicaid Other | Admitting: *Deleted

## 2021-04-11 ENCOUNTER — Ambulatory Visit: Payer: Medicaid Other | Attending: Obstetrics and Gynecology

## 2021-04-11 VITALS — BP 131/75 | HR 95

## 2021-04-11 VITALS — BP 122/72 | Wt 246.6 lb

## 2021-04-11 DIAGNOSIS — Z3A35 35 weeks gestation of pregnancy: Secondary | ICD-10-CM

## 2021-04-11 DIAGNOSIS — O09293 Supervision of pregnancy with other poor reproductive or obstetric history, third trimester: Secondary | ICD-10-CM

## 2021-04-11 DIAGNOSIS — O09529 Supervision of elderly multigravida, unspecified trimester: Secondary | ICD-10-CM | POA: Insufficient documentation

## 2021-04-11 DIAGNOSIS — O10913 Unspecified pre-existing hypertension complicating pregnancy, third trimester: Secondary | ICD-10-CM | POA: Insufficient documentation

## 2021-04-11 DIAGNOSIS — O09891 Supervision of other high risk pregnancies, first trimester: Secondary | ICD-10-CM | POA: Insufficient documentation

## 2021-04-11 DIAGNOSIS — O10919 Unspecified pre-existing hypertension complicating pregnancy, unspecified trimester: Secondary | ICD-10-CM

## 2021-04-11 DIAGNOSIS — O0990 Supervision of high risk pregnancy, unspecified, unspecified trimester: Secondary | ICD-10-CM | POA: Diagnosis not present

## 2021-04-11 DIAGNOSIS — O3433 Maternal care for cervical incompetence, third trimester: Secondary | ICD-10-CM

## 2021-04-11 DIAGNOSIS — O3431 Maternal care for cervical incompetence, first trimester: Secondary | ICD-10-CM

## 2021-04-11 DIAGNOSIS — O99210 Obesity complicating pregnancy, unspecified trimester: Secondary | ICD-10-CM | POA: Diagnosis not present

## 2021-04-11 DIAGNOSIS — O10013 Pre-existing essential hypertension complicating pregnancy, third trimester: Secondary | ICD-10-CM

## 2021-04-11 DIAGNOSIS — Z6841 Body Mass Index (BMI) 40.0 and over, adult: Secondary | ICD-10-CM | POA: Insufficient documentation

## 2021-04-11 DIAGNOSIS — O09523 Supervision of elderly multigravida, third trimester: Secondary | ICD-10-CM

## 2021-04-11 LAB — FETAL NONSTRESS TEST

## 2021-04-11 NOTE — Progress Notes (Signed)
? ? ?Routine Prenatal Care Visit ? ?Subjective  ?Sarah Burke is a 41 y.o. G3P1101 at [redacted]w[redacted]d being seen today for ongoing prenatal care.  She is currently monitored for the following issues for this high-risk pregnancy and has History of Bell's palsy; Situational mixed anxiety and depressive disorder; Class 3 severe obesity due to excess calories with serious comorbidity and body mass index (BMI) of 40.0 to 44.9 in adult [facility]); Vaginal pain; Urinary frequency; Metabolic syndrome; Prediabetes; Hyperlipidemia, mixed; Class 3 obesity with alveolar hypoventilation, serious comorbidity, and body mass index (BMI) of 45.0 to 49.9 in adult [facility]); Cluster headache, not intractable; Inadequate sleep hygiene; Psychophysiological insomnia; Supervision of high risk pregnancy, antepartum; Chronic hypertension affecting pregnancy; History of preterm delivery, currently pregnant in first trimester; AMA (advanced maternal age) multigravida 24+, unspecified trimester; Cervical insufficiency in pregnancy, antepartum, first trimester; and Maternal obesity affecting pregnancy, antepartum on their problem list.  ?----------------------------------------------------------------------------------- ?Patient reports  decreased fetal movement for last 1-2 weeks. Doing kick counts at home .   ?Contractions: Irritability. Vag. Bleeding: None.  Movement: Present. Denies leaking of fluid.  ?----------------------------------------------------------------------------------- ?The following portions of the patient's history were reviewed and updated as appropriate: allergies, current medications, past family history, past medical history, past social history, past surgical history and problem list. Problem list updated. ? ? ?Objective  ?Blood pressure 122/72, weight 246 lb 9.6 oz (111.9 kg), last menstrual period 08/08/2020. ?Pregravid weight 252 lb (114.3 kg) Total Weight Gain -5 lb 6.4 oz (-2.449 kg) ?Urinalysis:     ? ?Fetal Status: Fetal  Heart Rate (bpm): 145   Movement: Present    ? ?General:  Alert, oriented and cooperative. Patient is in no acute distress.  ?Skin: Skin is warm and dry. No rash noted.   ?Cardiovascular: Normal heart rate noted  ?Respiratory: Normal respiratory effort, no problems with respiration noted  ?Abdomen: Soft, gravid, appropriate for gestational age. Pain/Pressure: Present     ?Pelvic:  Cervical exam deferred        ?Extremities: Normal range of motion.  Edema: None  ?Mental Status: Normal mood and affect. Normal behavior. Normal judgment and thought content.  ? ? ? ?Assessment  ? ?41 y.o. G3P1101 at [redacted]w[redacted]d by  05/14/2021, by Ultrasound presenting for routine prenatal visit ? ?Plan  ? ?pregnancy 3 Problems (from 10/16/20 to present)   ? ? Problem Noted Resolved  ? Maternal obesity affecting pregnancy, antepartum 04/04/2021 by Mora Bellman, MD No  ? Supervision of high risk pregnancy, antepartum 10/16/2020 by Homero Fellers, MD No  ? Overview Addendum 03/08/2021  9:45 AM by Homero Fellers, MD  ?   ?Nursing Staff Provider  ?Office Location  [facility] Dating  10 wk [place]  ?Language  English Anatomy US  complete  ?Flu Vaccine  12/11/2020 Genetic Screen  NIPS: normal xy  ?TDaP vaccine   03/08/2021 Hgb A1C or  ?GTT Early :118 ?Third trimester : glucose log normal  ?Covid unvaccinated   LAB RESULTS   ?Rhogam  Not needed Blood Type A/Positive/-- (09/06 [id number])   ?Feeding Plan breast Antibody Negative (09/06 0953)  ?Contraception  Rubella 1.38 (09/06 [id number])  ?Circumcision  RPR Non Reactive (09/06 0953)   ?Pediatrician   HBsAg Negative (09/06 0953)   ?Support Person  HIV Non Reactive (09/06 [id number])  ?Prenatal Classes Discussed Varicella immune  ?  GBS    ?BTL Consent needs    ?VBAC Consent  Pap  2022  ?  Hgb Electro    ?Pelvis Tested  CF  neg  ?   SMA ned  ?     ?History of preterm delivery-cerclage in place ? ?CHTN, history of gestational hypertension ?10/16/2020- started procardia XL 30 mg ?01/2021- Discontinued Procardia per MFM    ? 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  ?  ? Chronic hypertension affecting pregnancy 10/16/2020 by Homero Fellers, MD No  ? History of preterm delivery, currently pregnant in first trimester 10/16/2020 by Homero Fellers, MD No  ? Overview Signed 10/30/2020  5:10 PM by Will Bonnet, MD  ?  ~5 months, cervical insufficiecy? ?[ ]  MFM consult ASAP for possible treatment vs cerclage ?  ?  ? AMA (advanced maternal age) multigravida 17+, unspecified trimester 10/16/2020 by Homero Fellers, MD No  ? ?  ?  ?NST: 145 bpm baseline, moderate variability, 15x15 accelerations, no decelerations. ?Reactive ? ?Plan to remove cerclage next week in office ? ?Gestational age appropriate obstetric precautions including but not limited to vaginal bleeding, contractions, leaking of fluid and fetal movement were reviewed in detail with the patient.   ? ?Return in about 1 week (around 04/18/2021) for HROB WITH MD weekly for 4 weeks with MD. ? ?Homero Fellers MD ?[place], [place] ?04/11/2021, 9:31 AM ? ? ?

## 2021-04-11 NOTE — Patient Instructions (Signed)
Fetal Movement Counts Patient Name: ________________________________________________ Patient DueDate: ____________________ What is a fetal movement count?  A fetal movement count is the number of times that you feel your baby move during a certain amount of time. This may also be called a fetal kick count. A fetal movement count is recommended for every pregnant woman. You may be askedto start counting fetal movements as early as week 28 of your pregnancy. Pay attention to when your baby is most active. You may notice your baby's sleep and wake cycles. You may also notice things that make your baby move more. You should do a fetal movement count: When your baby is normally most active. At the same time each day. A good time to count movements is while you are resting, after having somethingto eat and drink. How do I count fetal movements? Find a quiet, comfortable area. Sit, or lie down on your side. Write down the date, the start time and stop time, and the number of movements that you felt between those two times. Take this information with you to your health care visits. Write down your start time when you feel the first movement. Count kicks, flutters, swishes, rolls, and jabs. You should feel at least 10 movements. You may stop counting after you have felt 10 movements, or if you have been counting for 2 hours. Write down the stop time. If you do not feel 10 movements in 2 hours, contact your health care provider for further instructions. Your health care provider may want to do additional tests to assess your baby's well-being. Contact a health care provider if: You feel fewer than 10 movements in 2 hours. Your baby is not moving like he or she usually does. Date: ____________ Start time: ____________ Stop time: ____________ Movements:____________ Date: ____________ Start time: ____________ Stop time: ____________ Movements:____________ Date: ____________ Start time: ____________ Stop  time: ____________ Movements:____________ Date: ____________ Start time: ____________ Stop time: ____________ Movements:____________ Date: ____________ Start time: ____________ Stop time: ____________ Movements:____________ Date: ____________ Start time: ____________ Stop time: ____________ Movements:____________ Date: ____________ Start time: ____________ Stop time: ____________ Movements:____________ Date: ____________ Start time: ____________ Stop time: ____________ Movements:____________ Date: ____________ Start time: ____________ Stop time: ____________ Movements:____________ This information is not intended to replace advice given to you by your health care provider. Make sure you discuss any questions you have with your healthcare provider. Document Revised: 09/16/2018 Document Reviewed: 09/16/2018 Elsevier Patient Education  2022 Elsevier Inc.  

## 2021-04-17 ENCOUNTER — Other Ambulatory Visit (HOSPITAL_COMMUNITY)
Admission: RE | Admit: 2021-04-17 | Discharge: 2021-04-17 | Disposition: A | Discharge status: Home / Self Care | Payer: Medicaid Other | Source: Ambulatory Visit | Attending: Obstetrics and Gynecology | Admitting: Obstetrics and Gynecology

## 2021-04-17 ENCOUNTER — Other Ambulatory Visit: Payer: Self-pay

## 2021-04-17 ENCOUNTER — Encounter: Payer: Self-pay | Admitting: Obstetrics and Gynecology

## 2021-04-17 ENCOUNTER — Ambulatory Visit (INDEPENDENT_AMBULATORY_CARE_PROVIDER_SITE_OTHER): Payer: Medicaid Other | Admitting: Obstetrics and Gynecology

## 2021-04-17 VITALS — BP 122/72 | Wt 246.0 lb

## 2021-04-17 DIAGNOSIS — O0990 Supervision of high risk pregnancy, unspecified, unspecified trimester: Secondary | ICD-10-CM | POA: Insufficient documentation

## 2021-04-17 DIAGNOSIS — O10919 Unspecified pre-existing hypertension complicating pregnancy, unspecified trimester: Secondary | ICD-10-CM

## 2021-04-17 DIAGNOSIS — O3431 Maternal care for cervical incompetence, first trimester: Secondary | ICD-10-CM

## 2021-04-17 DIAGNOSIS — Z3A36 36 weeks gestation of pregnancy: Secondary | ICD-10-CM

## 2021-04-17 DIAGNOSIS — O99210 Obesity complicating pregnancy, unspecified trimester: Secondary | ICD-10-CM

## 2021-04-17 DIAGNOSIS — O09529 Supervision of elderly multigravida, unspecified trimester: Secondary | ICD-10-CM

## 2021-04-17 LAB — POCT URINALYSIS DIPSTICK OB
Glucose, UA: NEGATIVE
POC,PROTEIN,UA: NEGATIVE

## 2021-04-17 NOTE — Progress Notes (Signed)
? ?  PRENATAL VISIT NOTE ? ?Subjective:  ?Sarah Burke is a 41 y.o. G3P1101 at [redacted]w[redacted]d being seen today for ongoing prenatal care.  She is currently monitored for the following issues for this high-risk pregnancy and has History of Bell's palsy; Situational mixed anxiety and depressive disorder; Class 3 severe obesity due to excess calories with serious comorbidity and body mass index (BMI) of 40.0 to 44.9 in adult [facility]); Vaginal pain; Urinary frequency; Metabolic syndrome; Prediabetes; Hyperlipidemia, mixed; Class 3 obesity with alveolar hypoventilation, serious comorbidity, and body mass index (BMI) of 45.0 to 49.9 in adult [facility]); Cluster headache, not intractable; Inadequate sleep hygiene; Psychophysiological insomnia; Supervision of high risk pregnancy, antepartum; Chronic hypertension affecting pregnancy; History of preterm delivery, currently pregnant in first trimester; AMA (advanced maternal age) multigravida 80+, unspecified trimester; Cervical insufficiency in pregnancy, antepartum, first trimester; and Maternal obesity affecting pregnancy, antepartum on their problem list. ? ?Patient reports no complaints.  Contractions: Not present. Vag. Bleeding: None.  Movement: Present. Denies leaking of fluid.  ? ?The following portions of the patient's history were reviewed and updated as appropriate: allergies, current medications, past family history, past medical history, past social history, past surgical history and problem list.  ? ?Objective:  ? ?Vitals:  ? 04/17/21 0829  ?BP: 122/72  ?Weight: 246 lb (111.6 kg)  ? ? ?Fetal Status: Fetal Heart Rate (bpm): 145 Fundal Height: 38 cm Movement: Present  Presentation: Vertex ? ?General:  Alert, oriented and cooperative. Patient is in no acute distress.  ?Skin: Skin is warm and dry. No rash noted.   ?Cardiovascular: Normal heart rate noted  ?Respiratory: Normal respiratory effort, no problems with respiration noted  ?Abdomen: Soft, gravid, appropriate for  gestational age.  Pain/Pressure: Absent     ?Pelvic: Cervical exam performed in the presence of a chaperone Dilation: Closed Effacement (%): Thick Station: Ballotable  ?Extremities: Normal range of motion.     ?Mental Status: Normal mood and affect. Normal behavior. Normal judgment and thought content.  ? ?Assessment and Plan:  ?Pregnancy: G3P1101 at [redacted]w[redacted]d ?1. Supervision of high risk pregnancy, antepartum ?Patient is doing well without complaints ?Cultures today ? ?2. Cervical insufficiency in pregnancy, antepartum, first trimester ?Cerclage removed today without difficulty ? ?3. Chronic hypertension affecting pregnancy ?Normotensive without medication ?Continue ASA ?Follow up growth per MFM schedule ?Plan for IOL at 37 per patient request ?Patient understands that [place] may not be performed as forms were signed less than 30 days from delivery. Patient verbalized understanding and wishes to proceed with IOL at 37 weeks ? ?4. AMA (advanced maternal age) multigravida 12+, unspecified trimester ? ? ?5. Maternal obesity affecting pregnancy, antepartum ? ? ?Preterm labor symptoms and general obstetric precautions including but not limited to vaginal bleeding, contractions, leaking of fluid and fetal movement were reviewed in detail with the patient. ?Please refer to After Visit Summary for other counseling recommendations.  ? ?Return in about 1 week (around 04/24/2021) for in person, ROB, High risk. ? ?Future Appointments  ?Date Time Provider [place]  ?04/18/2021  8:30 AM [facility] NURSE [facility]  ?04/18/2021  8:45 AM [facility] US4 [facility]  ?04/24/2021  8:15 AM Schuman, Stefanie Libel, MD [facility] None  ?04/25/2021  8:30 AM [facility] NURSE [facility]  ?04/25/2021  8:45 AM [facility] US4 [facility]  ?05/01/2021  8:55 AM Caren Macadam, MD [facility] None  ?05/08/2021  9:15 AM Gae Dry, MD [facility] None  ? ? ?Mora Bellman, MD ? ?

## 2021-04-17 NOTE — Addendum Note (Signed)
Addended by: Burtis Junes on: 04/17/2021 10:12 AM ? ? Modules accepted: Orders ? ?

## 2021-04-18 ENCOUNTER — Encounter: Payer: Self-pay | Admitting: *Deleted

## 2021-04-18 ENCOUNTER — Ambulatory Visit: Payer: Medicaid Other | Attending: Obstetrics and Gynecology

## 2021-04-18 ENCOUNTER — Ambulatory Visit: Payer: Medicaid Other | Admitting: *Deleted

## 2021-04-18 VITALS — BP 121/71 | HR 86

## 2021-04-18 DIAGNOSIS — O10919 Unspecified pre-existing hypertension complicating pregnancy, unspecified trimester: Secondary | ICD-10-CM | POA: Insufficient documentation

## 2021-04-18 DIAGNOSIS — O99353 Diseases of the nervous system complicating pregnancy, third trimester: Secondary | ICD-10-CM

## 2021-04-18 DIAGNOSIS — G510 Bell's palsy: Secondary | ICD-10-CM | POA: Diagnosis not present

## 2021-04-18 DIAGNOSIS — O09293 Supervision of pregnancy with other poor reproductive or obstetric history, third trimester: Secondary | ICD-10-CM | POA: Diagnosis not present

## 2021-04-18 DIAGNOSIS — O99210 Obesity complicating pregnancy, unspecified trimester: Secondary | ICD-10-CM | POA: Diagnosis not present

## 2021-04-18 DIAGNOSIS — O10013 Pre-existing essential hypertension complicating pregnancy, third trimester: Secondary | ICD-10-CM | POA: Diagnosis not present

## 2021-04-18 DIAGNOSIS — O99213 Obesity complicating pregnancy, third trimester: Secondary | ICD-10-CM | POA: Diagnosis not present

## 2021-04-18 DIAGNOSIS — Z3A36 36 weeks gestation of pregnancy: Secondary | ICD-10-CM | POA: Diagnosis not present

## 2021-04-18 DIAGNOSIS — Z6841 Body Mass Index (BMI) 40.0 and over, adult: Secondary | ICD-10-CM | POA: Diagnosis not present

## 2021-04-18 DIAGNOSIS — O09891 Supervision of other high risk pregnancies, first trimester: Secondary | ICD-10-CM

## 2021-04-18 DIAGNOSIS — O09529 Supervision of elderly multigravida, unspecified trimester: Secondary | ICD-10-CM | POA: Insufficient documentation

## 2021-04-18 DIAGNOSIS — O0990 Supervision of high risk pregnancy, unspecified, unspecified trimester: Secondary | ICD-10-CM | POA: Diagnosis not present

## 2021-04-18 DIAGNOSIS — O10913 Unspecified pre-existing hypertension complicating pregnancy, third trimester: Secondary | ICD-10-CM | POA: Diagnosis not present

## 2021-04-18 DIAGNOSIS — O3433 Maternal care for cervical incompetence, third trimester: Secondary | ICD-10-CM | POA: Diagnosis not present

## 2021-04-18 DIAGNOSIS — O09523 Supervision of elderly multigravida, third trimester: Secondary | ICD-10-CM | POA: Diagnosis not present

## 2021-04-19 LAB — CERVICOVAGINAL ANCILLARY ONLY
Chlamydia: NEGATIVE
Comment: NEGATIVE
Comment: NORMAL
Neisseria Gonorrhea: NEGATIVE

## 2021-04-21 LAB — CULTURE, BETA STREP (GROUP B ONLY): Strep Gp B Culture: NEGATIVE

## 2021-04-22 ENCOUNTER — Other Ambulatory Visit: Payer: Self-pay

## 2021-04-22 ENCOUNTER — Other Ambulatory Visit
Admission: RE | Admit: 2021-04-22 | Discharge: 2021-04-22 | Disposition: A | Discharge status: Home / Self Care | Payer: Medicaid Other | Source: Ambulatory Visit | Attending: Obstetrics and Gynecology | Admitting: Obstetrics and Gynecology

## 2021-04-22 DIAGNOSIS — Z20822 Contact with and (suspected) exposure to covid-19: Secondary | ICD-10-CM | POA: Insufficient documentation

## 2021-04-22 DIAGNOSIS — Z01812 Encounter for preprocedural laboratory examination: Secondary | ICD-10-CM | POA: Insufficient documentation

## 2021-04-22 LAB — SARS CORONAVIRUS 2 (TAT 6-24 HRS): SARS Coronavirus 2: NEGATIVE

## 2021-04-23 ENCOUNTER — Encounter: Payer: Self-pay | Admitting: Obstetrics and Gynecology

## 2021-04-23 ENCOUNTER — Inpatient Hospital Stay
Admission: RE | Admit: 2021-04-23 | Discharge: 2021-04-27 | DRG: 785 | Disposition: A | Discharge status: Home / Self Care | Payer: Medicaid Other | Attending: Obstetrics & Gynecology | Admitting: Obstetrics & Gynecology

## 2021-04-23 DIAGNOSIS — Z0000 Encounter for general adult medical examination without abnormal findings: Secondary | ICD-10-CM | POA: Diagnosis present

## 2021-04-23 DIAGNOSIS — O99214 Obesity complicating childbirth: Secondary | ICD-10-CM | POA: Diagnosis not present

## 2021-04-23 DIAGNOSIS — O10919 Unspecified pre-existing hypertension complicating pregnancy, unspecified trimester: Secondary | ICD-10-CM | POA: Diagnosis present

## 2021-04-23 DIAGNOSIS — O1002 Pre-existing essential hypertension complicating childbirth: Secondary | ICD-10-CM | POA: Diagnosis not present

## 2021-04-23 DIAGNOSIS — Z302 Encounter for sterilization: Secondary | ICD-10-CM | POA: Diagnosis not present

## 2021-04-23 DIAGNOSIS — Z20822 Contact with and (suspected) exposure to covid-19: Secondary | ICD-10-CM | POA: Diagnosis not present

## 2021-04-23 DIAGNOSIS — O09891 Supervision of other high risk pregnancies, first trimester: Secondary | ICD-10-CM

## 2021-04-23 DIAGNOSIS — O99213 Obesity complicating pregnancy, third trimester: Secondary | ICD-10-CM | POA: Diagnosis not present

## 2021-04-23 DIAGNOSIS — O321XX0 Maternal care for breech presentation, not applicable or unspecified: Secondary | ICD-10-CM | POA: Diagnosis not present

## 2021-04-23 DIAGNOSIS — O99210 Obesity complicating pregnancy, unspecified trimester: Secondary | ICD-10-CM

## 2021-04-23 DIAGNOSIS — Z3490 Encounter for supervision of normal pregnancy, unspecified, unspecified trimester: Secondary | ICD-10-CM | POA: Diagnosis present

## 2021-04-23 DIAGNOSIS — O3431 Maternal care for cervical incompetence, first trimester: Secondary | ICD-10-CM | POA: Diagnosis present

## 2021-04-23 DIAGNOSIS — O09523 Supervision of elderly multigravida, third trimester: Secondary | ICD-10-CM | POA: Diagnosis not present

## 2021-04-23 DIAGNOSIS — Z3A37 37 weeks gestation of pregnancy: Secondary | ICD-10-CM

## 2021-04-23 DIAGNOSIS — O09529 Supervision of elderly multigravida, unspecified trimester: Secondary | ICD-10-CM

## 2021-04-23 DIAGNOSIS — O0990 Supervision of high risk pregnancy, unspecified, unspecified trimester: Principal | ICD-10-CM

## 2021-04-23 DIAGNOSIS — O133 Gestational [pregnancy-induced] hypertension without significant proteinuria, third trimester: Secondary | ICD-10-CM | POA: Diagnosis not present

## 2021-04-23 DIAGNOSIS — O134 Gestational [pregnancy-induced] hypertension without significant proteinuria, complicating childbirth: Secondary | ICD-10-CM | POA: Diagnosis not present

## 2021-04-23 DIAGNOSIS — O3433 Maternal care for cervical incompetence, third trimester: Secondary | ICD-10-CM | POA: Diagnosis not present

## 2021-04-23 LAB — COMPREHENSIVE METABOLIC PANEL
ALT: 13 U/L (ref 0–44)
AST: 14 U/L — ABNORMAL LOW (ref 15–41)
Albumin: 3.2 g/dL — ABNORMAL LOW (ref 3.5–5.0)
Alkaline Phosphatase: 107 U/L (ref 38–126)
Anion gap: 6 (ref 5–15)
BUN: 7 mg/dL (ref 6–20)
CO2: 22 mmol/L (ref 22–32)
Calcium: 9.1 mg/dL (ref 8.9–10.3)
Chloride: 106 mmol/L (ref 98–111)
Creatinine, Ser: 0.59 mg/dL (ref 0.44–1.00)
GFR, Estimated: >60 mL/min (ref >60)
Glucose, Bld: 98 mg/dL (ref 70–99)
Potassium: 3.8 mmol/L (ref 3.5–5.1)
Sodium: 134 mmol/L — ABNORMAL LOW (ref 135–145)
Total Bilirubin: 0.5 mg/dL (ref 0.3–1.2)
Total Protein: 7.1 g/dL (ref 6.5–8.1)

## 2021-04-23 LAB — TYPE AND SCREEN
ABO/RH(D): A POS
Antibody Screen: NEGATIVE

## 2021-04-23 LAB — CBC
HCT: 34.3 % — ABNORMAL LOW (ref 36.0–46.0)
Hemoglobin: 10.8 g/dL — ABNORMAL LOW (ref 12.0–15.0)
MCH: 25.8 pg — ABNORMAL LOW (ref 26.0–34.0)
MCHC: 31.5 g/dL (ref 30.0–36.0)
MCV: 82.1 fL (ref 80.0–100.0)
Platelets: 310 10*3/uL (ref 150–400)
RBC: 4.18 MIL/uL (ref 3.87–5.11)
RDW: 14.3 % (ref 11.5–15.5)
WBC: 6.2 10*3/uL (ref 4.0–10.5)
nRBC: 0 % (ref 0.0–0.2)

## 2021-04-23 LAB — PROTEIN / CREATININE RATIO, URINE
Creatinine, Urine: 200 mg/dL
Protein Creatinine Ratio: 0.09 mg/mg{Cre} (ref 0.00–0.15)
Total Protein, Urine: 17 mg/dL

## 2021-04-23 LAB — RPR: RPR Ser Ql: NONREACTIVE

## 2021-04-23 MED ORDER — LACTATED RINGERS IV SOLN
500.0000 mL | INTRAVENOUS | Status: DC | PRN
  Administered 2021-04-24 – 2021-04-25 (×2): 500 mL via INTRAVENOUS

## 2021-04-23 MED ORDER — LACTATED RINGERS IV SOLN: INTRAVENOUS | Status: DC

## 2021-04-23 MED ORDER — MISOPROSTOL 25 MCG QUARTER TABLET
25.0000 ug | ORAL_TABLET | ORAL | Status: DC | PRN
  Administered 2021-04-23 (×3): 25 ug via VAGINAL
  Filled 2021-04-23 (×3): qty 1

## 2021-04-23 MED ORDER — OXYCODONE-ACETAMINOPHEN 5-325 MG PO TABS: 1.0000 | ORAL_TABLET | ORAL | Status: DC | PRN

## 2021-04-23 MED ORDER — OXYTOCIN BOLUS FROM INFUSION: 333.0000 mL | Freq: Once | INTRAVENOUS | Status: DC

## 2021-04-23 MED ORDER — TERBUTALINE SULFATE 1 MG/ML IJ SOLN: 0.2500 mg | Freq: Once | INTRAMUSCULAR | Status: DC | PRN

## 2021-04-23 MED ORDER — MISOPROSTOL 25 MCG QUARTER TABLET
25.0000 ug | ORAL_TABLET | ORAL | Status: DC | PRN
  Administered 2021-04-24: 25 ug via VAGINAL
  Filled 2021-04-23 (×3): qty 1

## 2021-04-23 MED ORDER — OXYCODONE-ACETAMINOPHEN 5-325 MG PO TABS: 2.0000 | ORAL_TABLET | ORAL | Status: DC | PRN

## 2021-04-23 MED ORDER — OXYTOCIN 10 UNIT/ML IJ SOLN
INTRAMUSCULAR | Status: AC
  Filled 2021-04-23: qty 2

## 2021-04-23 MED ORDER — MISOPROSTOL 25 MCG QUARTER TABLET
25.0000 ug | ORAL_TABLET | Freq: Once | ORAL | Status: AC
  Administered 2021-04-23: 25 ug via BUCCAL

## 2021-04-23 MED ORDER — AMMONIA AROMATIC IN INHA
RESPIRATORY_TRACT | Status: AC
  Filled 2021-04-23: qty 10

## 2021-04-23 MED ORDER — SOD CITRATE-CITRIC ACID 500-334 MG/5ML PO SOLN: 30.0000 mL | ORAL | Status: DC | PRN

## 2021-04-23 MED ORDER — LIDOCAINE HCL (PF) 1 % IJ SOLN
30.0000 mL | INTRAMUSCULAR | Status: DC | PRN
  Filled 2021-04-23: qty 30

## 2021-04-23 MED ORDER — OXYTOCIN-SODIUM CHLORIDE 30-0.9 UT/500ML-% IV SOLN
2.5000 [IU]/h | INTRAVENOUS | Status: DC
  Filled 2021-04-23 (×2): qty 500

## 2021-04-23 MED ORDER — ONDANSETRON HCL 4 MG/2ML IJ SOLN: 4.0000 mg | Freq: Four times a day (QID) | INTRAMUSCULAR | Status: DC | PRN

## 2021-04-23 MED ORDER — MISOPROSTOL 25 MCG QUARTER TABLET
25.0000 ug | ORAL_TABLET | ORAL | Status: DC
Start: 2021-04-23 — End: 2021-04-23
  Administered 2021-04-23: 25 ug via VAGINAL

## 2021-04-23 MED ORDER — OXYTOCIN-SODIUM CHLORIDE 30-0.9 UT/500ML-% IV SOLN
1.0000 m[IU]/min | INTRAVENOUS | Status: DC
  Administered 2021-04-24 – 2021-04-25 (×3): 2 m[IU]/min via INTRAVENOUS

## 2021-04-23 MED ORDER — MISOPROSTOL 200 MCG PO TABS
ORAL_TABLET | ORAL | Status: AC
  Filled 2021-04-23: qty 4

## 2021-04-23 MED ORDER — ACETAMINOPHEN 325 MG PO TABS: 650.0000 mg | ORAL_TABLET | ORAL | Status: DC | PRN

## 2021-04-23 MED ORDER — MISOPROSTOL 25 MCG QUARTER TABLET
ORAL_TABLET | ORAL | Status: AC
  Administered 2021-04-24: 25 ug via VAGINAL
  Filled 2021-04-23: qty 2

## 2021-04-23 NOTE — H&P (Signed)
Sarah Burke is a 41 y.o. female presenting for induction of labor at 37 weeks secondary to chronic hypertension. ?OB History   ? ? Gravida  ?3  ? Para  ?2  ? Term  ?1  ? Preterm  ?1  ? AB  ?0  ? Living  ?1  ?  ? ? SAB  ?0  ? IAB  ?   ? Ectopic  ?   ? Multiple  ?   ? Live Births  ?1  ?   ?  ?  ? ?Past Medical History:  ?Diagnosis Date  ? Anxiety   ? Back pain   ? Chest pain   ? Constipation   ? Edema, lower extremity   ? GERD (gastroesophageal reflux disease)   ? High cholesterol   ? History of PCOS   ? Hypertension   ? Infertility, female   ? Pre-diabetes   ? Prediabetes   ? Shortness of breath   ? ?Past Surgical History:  ?Procedure Laterality Date  ? CERVICAL CERCLAGE N/A 11/15/2020  ? Procedure: CERCLAGE CERVICAL;  Surgeon: Natale Milch, MD;  Location: [facility] ORS;  Service: Gynecology;  Laterality: N/A;  ? DILATION AND CURETTAGE OF UTERUS    ? ?Family History: family history includes Breast cancer in her maternal aunt, maternal aunt, and maternal grandmother; Diabetes in her brother; Heart disease in her father; Hypercholesterolemia in her maternal grandmother; Hyperlipidemia in her father; Hypertension in her father, maternal grandmother, and mother; Obesity in her mother. ?Social History:  reports that she has never smoked. She has never used smokeless tobacco. She reports that she does not currently use alcohol. She reports that she does not use drugs. ? ? ?  ?Maternal Diabetes: No ?Genetic Screening: Normal ?Maternal Ultrasounds/Referrals: Normal ?Fetal Ultrasounds or other Referrals:  Referred to Materal Fetal Medicine  ?Maternal Substance Abuse:  No ?Significant Maternal Medications:  Meds include: Other: Valltrex for HSV suppression ?Significant Maternal Lab Results:  Group B Strep negative ?Other Comments:  None ? ?Review of Systems  ?Constitutional: Negative.   ?HENT: Negative.    ?Eyes: Negative.   ?Respiratory: Negative.    ?Cardiovascular: Negative.   ?Gastrointestinal:   ?     Gravid  abdomen  ?Endocrine: Negative.   ?Genitourinary: Negative.   ?Musculoskeletal: Negative.   ?Allergic/Immunologic: Negative.   ?Neurological: Negative.   ?Hematological: Negative.   ?Psychiatric/Behavioral: Negative.    ?History ?Dilation: Closed ?Effacement (%): Thick ?Station: Ballotable ?Exam by:: Eunice Blase CNM ?Blood pressure (!) 141/85, pulse 95, temperature 98.2 ?F (36.8 ?C), temperature source Oral, resp. rate 18, height 5\' 2"  (1.575 m), weight 112 kg, last menstrual period 08/08/2020. ?Maternal Exam:  ?Uterine Assessment: Contraction frequency is rare.  ?Abdomen: Fetal presentation: vertex ?Introitus: Normal vulva. Normal vagina.  Pelvis: adequate for delivery.   ?Cervix: Cervix evaluated by digital exam.   ? ?Physical Exam ?Constitutional:   ?   Appearance: Normal appearance. She is obese.  ?HENT:  ?   Head: Normocephalic and atraumatic.  ?Cardiovascular:  ?   Rate and Rhythm: Normal rate and regular rhythm.  ?   Pulses: Normal pulses.  ?   Heart sounds: Normal heart sounds.  ?Pulmonary:  ?   Effort: Pulmonary effort is normal.  ?   Breath sounds: Normal breath sounds.  ?Abdominal:  ?   Comments: Gravid abdomen ?BMI 45  ?Genitourinary: ?   General: Normal vulva.  ?Musculoskeletal:     ?   General: Normal range of motion.  ?  Cervical back: Normal range of motion and neck supple.  ?Skin: ?   General: Skin is warm and dry.  ?Neurological:  ?   General: No focal deficit present.  ?   Mental Status: She is alert and oriented to person, place, and time.  ?Psychiatric:     ?   Mood and Affect: Mood normal.     ?   Behavior: Behavior normal.  ?  ?Prenatal labs: ?ABO, Rh: --/--/PENDING (03/14 1093) ?Antibody: PENDING (03/14 2355) ?Rubella: 1.38 (09/06 7322) ?RPR: Non Reactive (01/27 0954)  ?HBsAg: Negative (09/06 0953)  ?HIV: Non Reactive (01/27 0954)  ?GBS: Negative/-- (03/08 0254)  ? ?Assessment/Plan: ?IUP [redacted] weeks gestation ?Chronic hypertension ?Morbid obesity ?Cerclage this pregnancy ?Category 1 FHTs ?Bishop score  1 ? For induction of labor ?Cervical ripening to start. Cytotec placed vaginally and also buccal ?Continuous EFM ?IV access ?Monitor Bps. ?CMP, UPC and CBC ?Will recheck in 4 hours. ?Patient plans epidural. ? ?Mirna Mires, CNM  ?04/23/2021 9:22 AM  ?  ? ?Mirna Mires ?04/23/2021, 9:17 AM ? ? ? ? ?

## 2021-04-23 NOTE — Progress Notes (Signed)
Sarah Burke is a 41 y.o. G3P1101 at [redacted]w[redacted]d by ultrasound admitted for induction of labor due to Hypertension. ? ?Subjective:she is comfortabel ; aware of some contractions but nothing Painful ? ? ?Objective: ?BP 134/68   Pulse 83   Temp 98.6 ?F (37 ?C) (Oral)   Resp 18   Ht 5\' 2"  (1.575 m)   Wt 112 kg   LMP 08/08/2020   BMI 45.18 kg/m?  ?No intake/output data recorded. ?No intake/output data recorded. ? ?FHT:  FHR: 135 bpm, variability: moderate,  accelerations:  Present,  decelerations:  Absent ?UC:   irregular, every 5-7 minutes ?SVE:   Dilation: Closed ?Effacement (%): Thick ?Station: Ballotable ?Exam by:: [email or web address] CNM ? ?Labs: ?Lab Results  ?Component Value Date  ? WBC 6.2 04/23/2021  ? HGB 10.8 (L) 04/23/2021  ? HCT 34.3 (L) 04/23/2021  ? MCV 82.1 04/23/2021  ? PLT 310 04/23/2021  ? ? ?Assessment / Plan: ?Induction of labor due to gestational hypertension ? ?Labor: Progressing normally with cervical ripening. Cytotec 25 mcg placed vaginally; 25 mcg buccal ? ?Fetal Wellbeing:  Category I ?Pain Control:  Labor support without medications ?I/D:  n/a ?Anticipated MOD:  NSVD ? ?04/25/2021 ?04/23/2021, 4:28 PM ? ? ?

## 2021-04-24 ENCOUNTER — Encounter: Payer: Medicaid Other | Admitting: Obstetrics and Gynecology

## 2021-04-24 DIAGNOSIS — Z3A37 37 weeks gestation of pregnancy: Secondary | ICD-10-CM | POA: Diagnosis not present

## 2021-04-24 DIAGNOSIS — O133 Gestational [pregnancy-induced] hypertension without significant proteinuria, third trimester: Secondary | ICD-10-CM | POA: Diagnosis not present

## 2021-04-24 NOTE — Progress Notes (Signed)
? ?  Subjective:  ?Pt comfortable. Ate breakfast. Her partner is at her side. Discussed benefits of FB placement and starting Pitocin, agreeable to plan.  ? ?Objective:  ? ?Vitals: Blood pressure (!) 115/43, pulse 70, temperature 98.2 ?F (36.8 ?C), temperature source Oral, resp. rate 15, height 5\' 2"  (1.575 m), weight 112 kg, last menstrual period 08/08/2020. ?General: NAd ?Abdomen:non tender ?Cervical Exam:  ?Dilation: 1 ?Effacement (%): Thick ?Cervical Position: Posterior ?Station: Ballotable ?Exam by:: Fynley Chrystal CNM ? ?FHT: baseline 140, moderate variability, pos accel, neg decel ?Toco:q 2, mild, soft resting tone  ? ?No results found for this or any previous visit (from the past 24 hour(s)). ? ?Assessment:  ? 41 y.o. [id number] [redacted]w[redacted]d admitted for induction of labor secondary to [facility] ? ?Plan:  ? ?1) Labor -FB placed around 1000, Pitocin ordered  ? ?2) Fetus - Category 1 tracing ? ?3) GBS negative, membranes intact ? ?4) Pain management: aware of all options, will ask if desired ? ?Roberto Scales, CNM  ?[place], [place]  ?@TODAY @  ?10:19 AM  ? ? ?  ?

## 2021-04-24 NOTE — Progress Notes (Signed)
? ?  Subjective:  ?Contractions  a little stronger.  Offered taking a break and be able to eat or continue moving forward with induction. Madiha desires to eat and then resume pitocin.  ? ?Objective:  ? ?Vitals: Blood pressure 125/75, pulse 88, temperature 98.2 ?F (36.8 ?C), temperature source Oral, resp. rate 16, height 5\' 2"  (1.575 m), weight 112 kg, last menstrual period 08/08/2020. ?General: NAD ?Abdomen:non tender  ?Cervical Exam:  ?Dilation: 4 ?Effacement (%): Thick ?Cervical Position: Anterior ?Station: -3 ?Exam by:: Bishoy Cupp CNM ? ?FHT: baseline 135, moderate variability, pos accel, neg decel ?Toco:q2-3, mild, soft resting tone ?Pitocin at 8 milli-units/min  ? ?No results found for this or any previous visit (from the past 24 hour(s)). ? ?Assessment:  ? 41 y.o. 24 [redacted]w[redacted]d admitted for IOL secondary to [facility] ? ?Plan:  ? ?1) Labor -FB placed around 1000, Pitocin infusing, encouraged upright movement, AROM when able. Once her partner returns with  food, will stop Pitocin x 30 mins. Then resume induction.  ?  ?2) Fetus - Category 1 tracing ?  ?3) GBS negative, membranes intact ?  ?4) Pain management: aware of all options, will ask if desired ? ?[facility], CNM  ?Carie Caddy, [facility]  ?@TODAY @  ?8:10 PM  ? ?

## 2021-04-24 NOTE — Progress Notes (Signed)
? ?  Subjective:  ?Starting to feel some contractions, but comfortable. Her partner, Fayrene Fearing, at her side. ? ?Objective:  ? ?Vitals: Blood pressure 103/66, pulse 83, temperature 98.4 ?F (36.9 ?C), temperature source Oral, resp. rate 15, height 5\' 2"  (1.575 m), weight 112 kg, last menstrual period 08/08/2020. ?General:  ?Abdomen:no tender ?Cervical Exam:  ?Dilation: 4 ?Effacement (%): Thick ?Cervical Position: Posterior ?Station: -3 ?Exam by:: Joyous Gleghorn CNM ? ?FHT: baseline 125, moderate variability, pos accel, neg decel ?Toco:irregular, soft resting tone ?Pitocin @ 14 milli-units/min ? ?No results found for this or any previous visit (from the past 24 hour(s)). ? ?Assessment:  ? 41 y.o. 24 109w1d admitted for IOL secondary to [facility]  ? ?Plan:  ? ?1) Labor -FB placed around 1000, Pitocin infusing, encouraged upright movement, AROM when able.  ?  ?2) Fetus - Category 1 tracing ?  ?3) GBS negative, membranes intact ?  ?4) Pain management: aware of all options, will ask if desired ? ?[facility], CNM  ?Carie Caddy, [facility]  ?@TODAY @  ?3:40 PM  ? ?  ?

## 2021-04-25 ENCOUNTER — Encounter
Admission: RE | Disposition: A | Discharge status: Home / Self Care | Payer: Self-pay | Source: Home / Self Care | Attending: Obstetrics & Gynecology

## 2021-04-25 ENCOUNTER — Inpatient Hospital Stay: Payer: Medicaid Other | Admitting: Registered Nurse

## 2021-04-25 ENCOUNTER — Ambulatory Visit: Payer: Medicaid Other

## 2021-04-25 ENCOUNTER — Encounter: Payer: Self-pay | Admitting: Obstetrics and Gynecology

## 2021-04-25 DIAGNOSIS — O134 Gestational [pregnancy-induced] hypertension without significant proteinuria, complicating childbirth: Secondary | ICD-10-CM

## 2021-04-25 DIAGNOSIS — O99214 Obesity complicating childbirth: Secondary | ICD-10-CM

## 2021-04-25 DIAGNOSIS — O321XX0 Maternal care for breech presentation, not applicable or unspecified: Secondary | ICD-10-CM | POA: Diagnosis not present

## 2021-04-25 DIAGNOSIS — Z3A37 37 weeks gestation of pregnancy: Secondary | ICD-10-CM

## 2021-04-25 DIAGNOSIS — O09523 Supervision of elderly multigravida, third trimester: Secondary | ICD-10-CM

## 2021-04-25 DIAGNOSIS — Z302 Encounter for sterilization: Secondary | ICD-10-CM

## 2021-04-25 DIAGNOSIS — O3433 Maternal care for cervical incompetence, third trimester: Secondary | ICD-10-CM

## 2021-04-25 LAB — CBC
HCT: 33.1 % — ABNORMAL LOW (ref 36.0–46.0)
Hemoglobin: 10.4 g/dL — ABNORMAL LOW (ref 12.0–15.0)
MCH: 25.7 pg — ABNORMAL LOW (ref 26.0–34.0)
MCHC: 31.4 g/dL (ref 30.0–36.0)
MCV: 81.7 fL (ref 80.0–100.0)
Platelets: 285 10*3/uL (ref 150–400)
RBC: 4.05 MIL/uL (ref 3.87–5.11)
RDW: 14.1 % (ref 11.5–15.5)
WBC: 9 10*3/uL (ref 4.0–10.5)
nRBC: 0 % (ref 0.0–0.2)

## 2021-04-25 SURGERY — Surgical Case
Anesthesia: Epidural

## 2021-04-25 MED ORDER — BUPIVACAINE ON-Q PAIN PUMP (FOR ORDER SET NO CHG)
INJECTION | Status: DC
  Filled 2021-04-25: qty 1

## 2021-04-25 MED ORDER — LIDOCAINE-EPINEPHRINE (PF) 1.5 %-1:200000 IJ SOLN
INTRAMUSCULAR | Status: DC | PRN
  Administered 2021-04-25: 4 mL via EPIDURAL
  Administered 2021-04-25: 3 mL via EPIDURAL

## 2021-04-25 MED ORDER — PHENYLEPHRINE 40 MCG/ML (10ML) SYRINGE FOR IV PUSH (FOR BLOOD PRESSURE SUPPORT): 80.0000 ug | PREFILLED_SYRINGE | INTRAVENOUS | Status: DC | PRN

## 2021-04-25 MED ORDER — OXYTOCIN-SODIUM CHLORIDE 30-0.9 UT/500ML-% IV SOLN
2.5000 [IU]/h | INTRAVENOUS | Status: AC
  Administered 2021-04-25 – 2021-04-26 (×2): 2.5 [IU]/h via INTRAVENOUS
  Filled 2021-04-25: qty 500

## 2021-04-25 MED ORDER — DEXTROSE 5 % IV SOLN
INTRAVENOUS | Status: DC | PRN
  Administered 2021-04-25: 2 g via INTRAVENOUS

## 2021-04-25 MED ORDER — PHENYLEPHRINE HCL-NACL 20-0.9 MG/250ML-% IV SOLN
INTRAVENOUS | Status: DC | PRN
  Administered 2021-04-25: 15 ug/min via INTRAVENOUS

## 2021-04-25 MED ORDER — KETOROLAC TROMETHAMINE 30 MG/ML IJ SOLN
30.0000 mg | Freq: Four times a day (QID) | INTRAMUSCULAR | Status: DC
Start: 2021-04-25 — End: 2021-04-26
  Administered 2021-04-25 – 2021-04-26 (×3): 30 mg via INTRAVENOUS
  Filled 2021-04-25 (×3): qty 1

## 2021-04-25 MED ORDER — MORPHINE SULFATE (PF) 0.5 MG/ML IJ SOLN
INTRAMUSCULAR | Status: AC
  Filled 2021-04-25: qty 10

## 2021-04-25 MED ORDER — DEXAMETHASONE SODIUM PHOSPHATE 4 MG/ML IJ SOLN
INTRAMUSCULAR | Status: DC | PRN
  Administered 2021-04-25: 8 mg via INTRAVENOUS

## 2021-04-25 MED ORDER — IBUPROFEN 600 MG PO TABS: 600.0000 mg | ORAL_TABLET | Freq: Four times a day (QID) | ORAL | Status: DC

## 2021-04-25 MED ORDER — EPHEDRINE 5 MG/ML INJ: 10.0000 mg | INTRAVENOUS | Status: DC | PRN

## 2021-04-25 MED ORDER — FENTANYL CITRATE (PF) 100 MCG/2ML IJ SOLN
INTRAMUSCULAR | Status: DC | PRN
  Administered 2021-04-25: 100 ug via EPIDURAL

## 2021-04-25 MED ORDER — FENTANYL-BUPIVACAINE-NACL 0.5-0.125-0.9 MG/250ML-% EP SOLN
12.0000 mL/h | EPIDURAL | Status: DC | PRN
  Administered 2021-04-25: 12 mL/h via EPIDURAL

## 2021-04-25 MED ORDER — OXYTOCIN-SODIUM CHLORIDE 30-0.9 UT/500ML-% IV SOLN
INTRAVENOUS | Status: DC | PRN
  Administered 2021-04-25: 250 mL/h via INTRAVENOUS

## 2021-04-25 MED ORDER — BUPIVACAINE 0.25 % ON-Q PUMP DUAL CATH 400 ML
400.0000 mL | INJECTION | Status: DC
  Filled 2021-04-25: qty 400

## 2021-04-25 MED ORDER — ONDANSETRON HCL 4 MG/2ML IJ SOLN
INTRAMUSCULAR | Status: DC | PRN
  Administered 2021-04-25: 4 mg via INTRAVENOUS

## 2021-04-25 MED ORDER — OXYTOCIN-SODIUM CHLORIDE 30-0.9 UT/500ML-% IV SOLN
INTRAVENOUS | Status: AC
  Filled 2021-04-25: qty 500

## 2021-04-25 MED ORDER — DIPHENHYDRAMINE HCL 50 MG/ML IJ SOLN: 12.5000 mg | INTRAMUSCULAR | Status: DC | PRN

## 2021-04-25 MED ORDER — MORPHINE SULFATE (PF) 2 MG/ML IV SOLN: 1.0000 mg | INTRAVENOUS | Status: DC | PRN

## 2021-04-25 MED ORDER — SODIUM CHLORIDE 0.9 % IV SOLN
INTRAVENOUS | Status: AC
  Filled 2021-04-25: qty 2

## 2021-04-25 MED ORDER — SOD CITRATE-CITRIC ACID 500-334 MG/5ML PO SOLN
ORAL | Status: AC
Start: 2021-04-25 — End: 2021-04-25
  Administered 2021-04-25: 30 mL via ORAL
  Filled 2021-04-25: qty 15

## 2021-04-25 MED ORDER — LIDOCAINE HCL (PF) 1 % IJ SOLN
INTRAMUSCULAR | Status: DC | PRN
  Administered 2021-04-25 (×2): 3 mL via SUBCUTANEOUS

## 2021-04-25 MED ORDER — LIDOCAINE HCL (PF) 2 % IJ SOLN
INTRAMUSCULAR | Status: DC | PRN
  Administered 2021-04-25: 60 mg via EPIDURAL
  Administered 2021-04-25 (×2): 100 mg via EPIDURAL

## 2021-04-25 MED ORDER — MORPHINE SULFATE (PF) 0.5 MG/ML IJ SOLN
INTRAMUSCULAR | Status: DC | PRN
  Administered 2021-04-25 (×2): 2 mg via EPIDURAL

## 2021-04-25 MED ORDER — BUPIVACAINE HCL (PF) 0.25 % IJ SOLN
INTRAMUSCULAR | Status: DC | PRN
  Administered 2021-04-25 (×2): 5 mL via EPIDURAL
  Administered 2021-04-25: 4 mL via EPIDURAL
  Administered 2021-04-25 (×2): 3 mL via EPIDURAL
  Administered 2021-04-25: 5 mL via EPIDURAL

## 2021-04-25 MED ORDER — FENTANYL CITRATE (PF) 100 MCG/2ML IJ SOLN
INTRAMUSCULAR | Status: AC
  Filled 2021-04-25: qty 2

## 2021-04-25 MED ORDER — LACTATED RINGERS IV SOLN
500.0000 mL | Freq: Once | INTRAVENOUS | Status: AC
  Administered 2021-04-25: 500 mL via INTRAVENOUS

## 2021-04-25 MED ORDER — BUPIVACAINE HCL (PF) 0.5 % IJ SOLN
INTRAMUSCULAR | Status: AC
  Administered 2021-04-25: 50 mg
  Filled 2021-04-25: qty 10

## 2021-04-25 MED ORDER — FENTANYL-BUPIVACAINE-NACL 0.5-0.125-0.9 MG/250ML-% EP SOLN
EPIDURAL | Status: AC
  Filled 2021-04-25: qty 250

## 2021-04-25 MED ORDER — BUTORPHANOL TARTRATE 1 MG/ML IJ SOLN
1.0000 mg | INTRAMUSCULAR | Status: DC | PRN
  Administered 2021-04-25: 1 mg via INTRAVENOUS
  Filled 2021-04-25: qty 2

## 2021-04-25 SURGICAL SUPPLY — 30 items
BACTOSHIELD CHG 4% 4OZ (MISCELLANEOUS) ×1
CATH KIT ON-Q SILVERSOAK 5 (CATHETERS) ×2 IMPLANT
CATH KIT ON-Q SILVERSOAK 5IN (CATHETERS) ×4 IMPLANT
CHLORAPREP W/TINT 26 (MISCELLANEOUS) ×4 IMPLANT
DERMABOND ADVANCED (GAUZE/BANDAGES/DRESSINGS) ×1
DERMABOND ADVANCED .7 DNX12 (GAUZE/BANDAGES/DRESSINGS) ×1 IMPLANT
DRESSING SURGICEL FIBRLLR 1X2 (HEMOSTASIS) IMPLANT
DRSG SURGICEL FIBRILLAR 1X2 (HEMOSTASIS) ×2
ELECT CAUTERY BLADE 6.4 (BLADE) ×2 IMPLANT
ELECT REM PT RETURN 9FT ADLT (ELECTROSURGICAL) ×2
ELECTRODE REM PT RTRN 9FT ADLT (ELECTROSURGICAL) ×1 IMPLANT
GLOVE SURG NEOPR MICRO LF SZ8 (GLOVE) ×2 IMPLANT
GOWN STRL REUS W/ TWL LRG LVL3 (GOWN DISPOSABLE) ×1 IMPLANT
GOWN STRL REUS W/ TWL XL LVL3 (GOWN DISPOSABLE) ×2 IMPLANT
GOWN STRL REUS W/TWL LRG LVL3 (GOWN DISPOSABLE) ×1
GOWN STRL REUS W/TWL XL LVL3 (GOWN DISPOSABLE) ×2
MANIFOLD NEPTUNE II (INSTRUMENTS) ×2 IMPLANT
MAT PREVALON FULL STRYKER (MISCELLANEOUS) ×2 IMPLANT
NS IRRIG 1000ML POUR BTL (IV SOLUTION) ×2 IMPLANT
PACK C SECTION AR (MISCELLANEOUS) ×2 IMPLANT
PAD OB MATERNITY 4.3X12.25 (PERSONAL CARE ITEMS) ×2 IMPLANT
PAD PREP 24X41 OB/GYN DISP (PERSONAL CARE ITEMS) ×2 IMPLANT
SCRUB CHG 4% DYNA-HEX 4OZ (MISCELLANEOUS) ×1 IMPLANT
SUT MAXON ABS #0 GS21 30IN (SUTURE) ×4 IMPLANT
SUT PLAIN GUT 2-0 30 C14 SG823 (SUTURE) ×2
SUT VIC AB 1 CT1 36 (SUTURE) ×7 IMPLANT
SUT VIC AB 2-0 CT1 36 (SUTURE) ×2 IMPLANT
SUT VIC AB 4-0 FS2 27 (SUTURE) ×2 IMPLANT
SUTURE PLN GUT2-0 30 C14 SG823 (SUTURE) IMPLANT
WATER STERILE IRR 500ML POUR (IV SOLUTION) ×2 IMPLANT

## 2021-04-25 NOTE — Op Note (Signed)
Cesarean Section Procedure Note ?Indications: malpresentation: BREECH and term intrauterine pregnancy, Desire for permanent sterility ? ?Pre-operative Diagnosis: Intrauterine pregnancy [redacted]w[redacted]d ;  malpresentation: BREECH and term intrauterine pregnancy, Desire for permanent sterility ?Post-operative Diagnosis: same, delivered. ?Procedure: Low Transverse Cesarean Section, Bilateral Tubal Ligation ?Surgeon: Annamarie Major, MD ?Anesthesia: Epidural anesthesia ?Estimated Blood Loss:1000 ?Complications: None; patient tolerated the procedure well. ?Disposition: PACU - hemodynamically stable. ?Condition: stable ? ?Findings: ?A female infant in the breech (Complete) presentation. ?Amniotic fluid - Clear  ?Birth weight 5 lbs 15 oz.  ?Apgars of 8 and 9.  ?Intact placenta with a three-vessel cord. Grossly normal uterus, tubes and ovaries bilaterally. No intraabdominal adhesions were noted. ? ?Procedure Details  ? ?The patient was taken to Operating Room, identified as the correct patient and the procedure verified as C-Section Delivery. A Time Out was held and the above information confirmed. ?After induction of anesthesia, the patient was draped and prepped in the usual sterile manner. A Pfannenstiel incision was made and carried down through the subcutaneous tissue to the fascia. Fascial incision was made and extended transversely with the Mayo scissors. The fascia was separated from the underlying rectus tissue superiorly and inferiorly. The peritoneum was identified and entered bluntly. Peritoneal incision was extended longitudinally. The utero-vesical peritoneal reflection was incised transversely and a bladder flap was created digitally.  ?A low transverse hysterotomy was made. The fetus was delivered breech atraumatically. The umbilical cord was clamped x2 and cut and the infant was handed to the awaiting pediatricians. The placenta was removed intact and appeared normal with a 3-vessel cord.  ?The uterus was exteriorized and  cleared of all clot and debris. The hysterotomy was closed with running sutures of 0 Vicryl suture. A second imbricating layer was placed with the same suture. Excellent hemostasis was observed.  ? ?The left Fallopian tube was identified, grasped with the Babcock clamps, lifted to the skin incision and followed out distally to the fimbriae. An avascular midsection of the tube approximately 3-4cm from the cornua was grasped with the babcock clamps and brought into a knuckle at the skin incision. The tube was double ligated with 2-0 Vicryl suture and the intervening portion of tube was transected and removed. Excellent hemostasis was noted and the tube was returned to the abdomen. Attention was then turned to the right fallopian tube after confirmation of identification by tracing the tube out to the fimbriae. The same procedure was then performed on the right Fallopian tube. Again, excellent hemostasis was noted at the end of the procedure. ? ?The uterus was returned to the abdomen. The pelvis was irrigated and again, excellent hemostasis was noted.  ?The On Q Pain pump System was then placed.  Trocars were placed through the abdominal wall into the subfascial space and these were used to thread the silver soaker cathaters into place.The rectus fascia was then reapproximated with running sutures of Maxon, with careful placement not to incorporate the cathaters. Subcutaneous tissues are then irrigated with saline and hemostasis assured.  Skin is then closed with 4-0 vicryl suture in a subcuticular fashion followed by skin adhesive. ?The cathaters are flushed each with 5 mL of Bupivicaine and stabilized into place with dressing. ?Instrument, sponge, and needle counts were correct prior to the abdominal closure and at the conclusion of the case.  ?The patient tolerated the procedure well and was transferred to the recovery room in stable condition.  ? ?Annamarie Major, MD, FACOG ?[facility], [facility] ?04/25/2021  7:13 PM ? ?

## 2021-04-25 NOTE — Anesthesia Procedure Notes (Signed)
Epidural ?Patient location during procedure: OB ?Start time: 04/25/2021 12:28 PM ?End time: 04/25/2021 12:47 PM ? ?Staffing ?Anesthesiologist: Foye Deer, MD ?Resident/CRNA: Jeanine Luz, CRNA ?Performed: resident/CRNA  ? ?Preanesthetic Checklist ?Completed: patient identified, IV checked, site marked, risks and benefits discussed, surgical consent, monitors and equipment checked and pre-op evaluation ? ?Epidural ?Patient position: sitting ?Prep: ChloraPrep ?Patient monitoring: heart rate, continuous pulse ox and blood pressure ?Approach: midline ?Location: L3-L4 ?Injection technique: LOR saline ? ?Needle:  ?Needle type: Tuohy  ?Needle gauge: 17 G ?Needle length: 9 cm and 9 ?Needle insertion depth: 9 cm ?Catheter type: closed end flexible ?Catheter size: 19 Gauge ?Catheter at skin depth: 15 cm ?Test dose: negative and 1.5% lidocaine with Epi 1:200 K ? ?Assessment ?Events: blood not aspirated, injection not painful, no injection resistance, no paresthesia and negative IV test ? ?Additional Notes ?1 attempt ?Pt. Evaluated and documentation done after procedure finished. ?Patient identified. Risks/Benefits/Options discussed with patient including but not limited to bleeding, infection, nerve damage, paralysis, failed block, incomplete pain control, headache, blood pressure changes, nausea, vomiting, reactions to medication both or allergic, itching and postpartum back pain. Confirmed with bedside nurse the patient's most recent platelet count. Confirmed with patient that they are not currently taking any anticoagulation, have any bleeding history or any family history of bleeding disorders. Patient expressed understanding and wished to proceed. All questions were answered. Sterile technique was used throughout the entire procedure. Please see nursing notes for vital signs. Test dose was given through epidural catheter and negative prior to continuing to dose epidural or start infusion. Warning signs of high  block given to the patient including shortness of breath, tingling/numbness in hands, complete motor block, or any concerning symptoms with instructions to call for help. Patient was given instructions on fall risk and not to get out of bed. All questions and concerns addressed with instructions to call with any issues or inadequate analgesia.   ? ?Patient tolerated the insertion well without immediate complications.Reason for block:procedure for pain ? ? ? ?

## 2021-04-25 NOTE — Progress Notes (Signed)
? ?  Subjective:  ?Resting quietly  ? ?Objective:  ? ?Vitals: Blood pressure (!) 109/50, pulse 66, temperature 98.1 ?F (36.7 ?C), temperature source Oral, resp. rate 18, height 5\' 2"  (1.575 m), weight 112 kg, last menstrual period 08/08/2020. ?General:  ?Abdomen: ?Cervical Exam:  ?Dilation: 4 ?Effacement (%): Thick ?Cervical Position: Anterior ?Station: -3 ?Exam by:: L. Bertie Mcconathy CNM ? ?FHT: baseline 125, moderate variability, pos accel, no deccel  ?Toco:irregular  ?Pitocin at 2 milli-units  ?No results found for this or any previous visit (from the past 24 hour(s)). ? ?Assessment:  ? 41 y.o. 24 [redacted]w[redacted]d admitted for IOL secondary to [facility] ? ?Plan:  ? ?1) Labor --FB placed around 1000, Pitocin stopped at 0300, now infusing, AROM when able.  ? ?2) Fetus - Category 1 tracing ?Variables noted just before 3am, Pitocin turned off and IVF bolus given at that time, 1 prolonged decel at 0344 with return to baseline.  Tracing now category 1.  ? ?3) GBS negative, membranes intact ?  ?4) Pain management: received stadol around 0150  ? ?[facility], CNM  ?Carie Caddy, [facility]  ?@TODAY @  ?5:25 AM  ? ?

## 2021-04-25 NOTE — Anesthesia Preprocedure Evaluation (Addendum)
Anesthesia Evaluation  ?Patient identified by MRN, date of birth, ID band ?Patient awake ? ? ? ?Reviewed: ?Allergy & Precautions, NPO status , Patient's Chart, lab work & pertinent test results ? ?Airway ?Mallampati: III ? ?TM Distance: >3 FB ?Neck ROM: full ? ? ? Dental ? ?(+) Chipped ?  ?Pulmonary ?neg pulmonary ROS,  ?  ?Pulmonary exam normal ? ? ? ? ? ? ? Cardiovascular ?Exercise Tolerance: Good ?hypertension, negative cardio ROS ?Normal cardiovascular exam ? ? ?  ?Neuro/Psych ?  ? GI/Hepatic ?negative GI ROS,   ?Endo/Other  ?Morbid obesity ? Renal/GU ?  ?negative genitourinary ?  ?Musculoskeletal ? ? Abdominal ?(+) + obese,   ?Peds ? Hematology ?negative hematology ROS ?(+)   ?Anesthesia Other Findings ?41 y.o. [id number] [redacted]w[redacted]d admitted for IOL secondary to [facility]. AMA  ? ?Past Medical History: ?No date: Anxiety ?No date: Back pain ?No date: Chest pain ?No date: Constipation ?No date: Edema, lower extremity ?No date: GERD (gastroesophageal reflux disease) ?No date: High cholesterol ?No date: History of PCOS ?No date: Hypertension ?No date: Infertility, female ?No date: Pre-diabetes ?No date: Prediabetes ?No date: Shortness of breath ? ?Past Surgical History: ?11/15/2020: CERVICAL CERCLAGE; N/A ?    Comment:  Procedure: CERCLAGE CERVICAL;  Surgeon: Jerene Pitch,  ?             Jaquelyn Bitter, MD;  Location: [facility] ORS;  Service:  ?             Gynecology;  Laterality: N/A; ?No date: DILATION AND CURETTAGE OF UTERUS ? ?BMI   ? Body Mass Index: 45.18 kg/m?  ?  ? ? Reproductive/Obstetrics ?(+) Pregnancy ? ?  ? ? ? ? ? ? ? ? ? ? ? ? ? ?  ?  ? ? ? ? ? ? ? ? ?Anesthesia Physical ?Anesthesia Plan ? ?ASA: 2 ? ?Anesthesia Plan: Epidural  ? ?Post-op Pain Management:   ? ?Induction:  ? ?PONV Risk Score and Plan:  ? ?Airway Management Planned:  ? ?Additional Equipment:  ? ?Intra-op Plan:  ? ?Post-operative Plan:  ? ?Informed Consent:  ? ?Plan Discussed with: Anesthesiologist and CRNA ? ?Anesthesia Plan  Comments:   ? ? ? ? ? ? ?Anesthesia Quick Evaluation ? ?

## 2021-04-25 NOTE — Progress Notes (Signed)
?  Labor Progress Note  ? ?41 y.o. [id number] @ [redacted]w[redacted]d , admitted for  ?Pregnancy, Labor Management. [facility] ? ?Subjective:  ?Having low pelvic pain with epidural ? ?Objective:  ?BP 127/68   Pulse 82   Temp 97.6 ?F (36.4 ?C) (Oral)   Resp 18   Ht 5\' 2"  (1.575 m)   Wt 112 kg   LMP 08/08/2020   SpO2 100%   BMI 45.18 kg/m?  ?Abd: gravid, ND, FHT present, mild tenderness on exam ?Extr: trace to 1+ bilateral pedal edema ?SVE: CERVIX: 4 cm dilated, 60 effaced, -3,-2 station ?IUPC placed ? ?EFM: FHR: 140 bpm, variability: moderate,  accelerations:  Present,  decelerations:  Absent ?Toco: Frequency: Every 3-4 minutes ?Labs: I have reviewed the patient's lab results. ? ? ?Assessment & Plan:  ?08/10/2020 @ [redacted]w[redacted]d, admitted for  ?Pregnancy and Labor/Delivery Management ? ?1. Pain management: epidural. Call anesthesia to assess. ?2. FWB: FHT category I.  ?3. ID: GBS negative ?4. Labor management: continue pitocin titration, now has IUPC ? ?All discussed with patient, see orders ? ? ?[redacted]w[redacted]d, CNM ?[facility] ?04/25/2021  10:02 AM ? ?

## 2021-04-25 NOTE — Progress Notes (Signed)
? ?  Subjective:  ?Getting rest, contractions not as strong as they were previously.  ? ?Objective:  ? ?Vitals: Blood pressure (!) 110/48, pulse 79, temperature 98.2 ?F (36.8 ?C), temperature source Oral, resp. rate 16, height 5\' 2"  (1.575 m), weight 112 kg, last menstrual period 08/08/2020. ?General: NAD ?Abdomen:NON tender  ?Cervical Exam:  ?Dilation: 4 ?Effacement (%): Thick ?Cervical Position: Anterior ?Station: -3 ?Exam by:: Anael Rosch CNM ? ?FHT: baseline 130, moderate variability, pos accel, neg decel  ?Toco:per RN q 2-3 ?Pitocin at  14 milli-units/min  ? ?No results found for this or any previous visit (from the past 24 hour(s)). ? ?Assessment:  ? 41 y.o. [id number] [redacted]w[redacted]d admitted for IOL secondary to [facility] ? ?Plan:  ? ?1) Labor -FB placed around 1000, Pitocin infusing, AROM when able.  ?  ?2) Fetus - Category 1 tracing ?  ?3) GBS negative, membranes intact ?  ?4) Pain management: aware of all options, will ask if desired ? ?Roberto Scales, CNM  ?Mosetta Pigeon, [place]  ?@TODAY @  ?1:45 AM  ? ?  ?

## 2021-04-25 NOTE — Progress Notes (Signed)
?  Labor Progress Note  ? ?41 y.o. [id number] @ [redacted]w[redacted]d , admitted for  ?Pregnancy, Labor Management. [facility]  ? ?Subjective:  ?Now comfortable with epidural/spinal ? ?Objective:  ?BP 126/74   Pulse 93   Temp 98.1 ?F (36.7 ?C) (Oral)   Resp 18   Ht 5\' 2"  (1.575 m)   Wt 112 kg   LMP 08/08/2020   SpO2 100%   BMI 45.18 kg/m?  ?Abd: gravid, ND, FHT present, mild tenderness on exam ?Extr: trace to 1+ bilateral pedal edema ?SVE: CERVIX: 7 cm dilated, 80 effaced, -2,-1 station ? ?EFM: FHR: 130 bpm, variability: moderate,  accelerations:  Present,  decelerations:  Present variables ?Toco: Frequency: Every 1-4 minutes ?Labs: I have reviewed the patient's lab results. ? ? ?Assessment & Plan:  ?08/10/2020 @ [redacted]w[redacted]d, admitted for  ?Pregnancy and Labor/Delivery Management ? ?1. Pain management: epidural/spinal. ?2. FWB: FHT category II.  ?3. ID: GBS negative ?4. Labor management: continue pitocin titration ? ?All discussed with patient, see orders ? ? ?[redacted]w[redacted]d, CNM ?[facility] ?[facility] ?04/25/2021  1:15 PM ? ?

## 2021-04-25 NOTE — Progress Notes (Signed)
?  Labor Progress Note  ? ?41 y.o. [id number] @ [redacted]w[redacted]d , admitted for  ?Pregnancy, Labor Management.  ? ?Subjective:  ?Resting ? ?Objective:  ?BP 100/71   Pulse (!) 125   Temp 98.4 ?F (36.9 ?C) (Oral)   Resp 18   Ht 5\' 2"  (1.575 m)   Wt 112 kg   LMP 08/08/2020   SpO2 100%   BMI 45.18 kg/m?  ?Abd: gravid, ND, FHT present, mild tenderness on exam ?Extr: trace to 1+ bilateral pedal edema ?SVE: C/C/+1 Breech ?FHT is 150s with a change in variability to a more sinusidal pattern ? ?[place]- confirms BREECH ? ?Assessment & Plan:  ?[id number] @ [redacted]w[redacted]d, admitted for  ?Pregnancy and Labor/Delivery Management ? ?1. Pain management: epidural. ?2. FWB: FHT category 2.  ?3. ID: GBS negative ?4. Labor management: As is now found to be breech, I have discussed CS delivery as preferred mode of delivery.  Risks of vaginal breech delivery discussed.  She is comfortable and not feeling urge to push. ? ?Pt desires tubal ligation.  Signed papers 04/04/21. Still has full desire for permanent sterility. ?As she was induced, she was not planning delivery this soon (<30 days from signage of consent).  However, since she is delivering early we can proceed w tubal at time of CS.  She prefers [place] do this. ? ?The patient has been fully informed about all methods of contraception, both temporary and permanent. She understands that tubal ligation is meant to be permanent, absolute and irreversible. She was told that there is an approximately 1 in 400 chance of a pregnancy in the future after tubal ligation. She was told the short and long term complications of tubal ligation. She understands the risks from this surgery include, but are not limited to, the risks of anesthesia, hemorrhage, infection, perforation, and injury to adjacent structures, bowel, bladder and blood vessels.  ? ?The risks of cesarean section discussed with the patient included but were not limited to: bleeding which may require transfusion or reoperation; infection which may require  antibiotics; injury to bowel, bladder, ureters or other surrounding organs; injury to the fetus; need for additional procedures including hysterectomy in the event of a life-threatening hemorrhage; placental abnormalities wth subsequent pregnancies, incisional problems, thromboembolic phenomenon and other postoperative/anesthesia complications. The patient concurred with the proposed plan, giving informed written consent for the procedure.  ? ? ?All discussed with patient, see orders ? ?Barnett Applebaum, MD, FACOG ?[facility], [place] ?04/25/2021  5:59 PM ? ?

## 2021-04-25 NOTE — Progress Notes (Signed)
Pt's painful contractions returned after the bolus. Her level is present to ice T8 on R and T10 on L but not dense and patient is in distress. The catheter has likely migrated partially out. The epidural was replaced.   ?

## 2021-04-25 NOTE — Transfer of Care (Signed)
Immediate Anesthesia Transfer of Care Note ? ?Patient: Sarah Burke ? ?Procedure(s) Performed: CESAREAN SECTION ? ?Patient Location: PACU and Mother/Baby ? ?Anesthesia Type:Epidural ? ?Level of Consciousness: awake ? ?Airway & Oxygen Therapy: Patient Spontanous Breathing ? ?Post-op Assessment: Report given to RN ? ?Post vital signs: stable ? ?Last Vitals:  ?Vitals Value Taken Time  ?BP 98/71   ?Temp    ?Pulse 123   ?Resp 16   ?SpO2 99   ? ? ?Last Pain:  ?Vitals:  ? 04/25/21 1735  ?TempSrc: Oral  ?PainSc:   ?   ? ?Patients Stated Pain Goal: 0 (04/25/21 0943) ? ?Complications: No notable events documented. ?

## 2021-04-25 NOTE — Anesthesia Procedure Notes (Addendum)
Epidural ?Patient location during procedure: OB ?Start time: 04/25/2021 8:47 AM ?End time: 04/25/2021 8:49 AM ? ?Staffing ?Anesthesiologist: Foye Deer, MD ?Resident/CRNA: Karoline Caldwell, CRNA ?Performed: resident/CRNA  ? ?Preanesthetic Checklist ?Completed: patient identified, IV checked, site marked, risks and benefits discussed, surgical consent, monitors and equipment checked, pre-op evaluation and timeout performed ? ?Epidural ?Patient position: sitting ?Prep: ChloraPrep ?Patient monitoring: heart rate, continuous pulse ox and blood pressure ?Approach: midline ?Location: L3-L4 ?Injection technique: LOR saline ? ?Needle:  ?Needle type: Tuohy  ?Needle gauge: 17 G ?Needle length: 9 cm and 9 ?Needle insertion depth: 7 cm ?Catheter type: closed end flexible ?Catheter size: 19 Gauge ?Catheter at skin depth: 12 cm ?Test dose: negative and 1.5% lidocaine with Epi 1:200 K ? ?Assessment ?Sensory level: T10 ?Events: blood not aspirated, injection not painful, no injection resistance, no paresthesia and negative IV test ? ?Additional Notes ?1 attempt ?Pt. Evaluated and documentation done after procedure finished. ?Patient identified. Risks/Benefits/Options discussed with patient including but not limited to bleeding, infection, nerve damage, paralysis, failed block, incomplete pain control, headache, blood pressure changes, nausea, vomiting, reactions to medication both or allergic, itching and postpartum back pain. Confirmed with bedside nurse the patient's most recent platelet count. Confirmed with patient that they are not currently taking any anticoagulation, have any bleeding history or any family history of bleeding disorders. Patient expressed understanding and wished to proceed. All questions were answered. Sterile technique was used throughout the entire procedure. Please see nursing notes for vital signs. Test dose was given through epidural catheter and negative prior to continuing to dose epidural or  start infusion. Warning signs of high block given to the patient including shortness of breath, tingling/numbness in hands, complete motor block, or any concerning symptoms with instructions to call for help. Patient was given instructions on fall risk and not to get out of bed. All questions and concerns addressed with instructions to call with any issues or inadequate analgesia.   ? ?Patient tolerated the insertion well without immediate complications.Reason for block:procedure for pain ? ? ? ?

## 2021-04-25 NOTE — Progress Notes (Signed)
Pt complaining of abdomen pain with contractions. HDS. No motor blockade. Nursing staff reports level to ice at umbilicus bilaterally. Pt pushing pain button with no effect. Epidural line intact. It is not possible to see the depth of epidural due to depth. Bolused with 10cc .25% bupi. Advised patient that a replacement epidural would be beneficial if her pain comes back as the catheter has a chance of migrating out of the epidural space.  ?Daron Offer MD Anesthesia ?

## 2021-04-26 ENCOUNTER — Encounter: Payer: Self-pay | Admitting: Obstetrics & Gynecology

## 2021-04-26 LAB — CBC
HCT: 26.5 % — ABNORMAL LOW (ref 36.0–46.0)
Hemoglobin: 8.5 g/dL — ABNORMAL LOW (ref 12.0–15.0)
MCH: 26.3 pg (ref 26.0–34.0)
MCHC: 32.1 g/dL (ref 30.0–36.0)
MCV: 82 fL (ref 80.0–100.0)
Platelets: 259 10*3/uL (ref 150–400)
RBC: 3.23 MIL/uL — ABNORMAL LOW (ref 3.87–5.11)
RDW: 14 % (ref 11.5–15.5)
WBC: 18.6 10*3/uL — ABNORMAL HIGH (ref 4.0–10.5)
nRBC: 0 % (ref 0.0–0.2)

## 2021-04-26 MED ORDER — SIMETHICONE 80 MG PO CHEW
80.0000 mg | CHEWABLE_TABLET | Freq: Three times a day (TID) | ORAL | Status: DC
  Administered 2021-04-26 – 2021-04-27 (×2): 80 mg via ORAL
  Filled 2021-04-26 (×4): qty 1

## 2021-04-26 MED ORDER — PRENATAL MULTIVITAMIN CH
1.0000 | ORAL_TABLET | Freq: Every day | ORAL | Status: DC
  Administered 2021-04-26: 1 via ORAL
  Filled 2021-04-26: qty 1

## 2021-04-26 MED ORDER — IBUPROFEN 600 MG PO TABS
600.0000 mg | ORAL_TABLET | Freq: Four times a day (QID) | ORAL | Status: DC
  Administered 2021-04-26 – 2021-04-27 (×3): 600 mg via ORAL
  Filled 2021-04-26 (×3): qty 1

## 2021-04-26 MED ORDER — DIBUCAINE (PERIANAL) 1 % EX OINT
1.0000 | TOPICAL_OINTMENT | CUTANEOUS | Status: DC | PRN
Start: 2021-04-26 — End: 2021-04-27

## 2021-04-26 MED ORDER — OXYCODONE HCL 5 MG PO TABS
5.0000 mg | ORAL_TABLET | ORAL | Status: DC | PRN
  Administered 2021-04-27: 10 mg via ORAL
  Administered 2021-04-27: 5 mg via ORAL
  Filled 2021-04-26: qty 1
  Filled 2021-04-26: qty 2

## 2021-04-26 MED ORDER — ZOLPIDEM TARTRATE 5 MG PO TABS: 5.0000 mg | ORAL_TABLET | Freq: Every evening | ORAL | Status: DC | PRN

## 2021-04-26 MED ORDER — SENNOSIDES-DOCUSATE SODIUM 8.6-50 MG PO TABS
2.0000 | ORAL_TABLET | ORAL | Status: DC
  Administered 2021-04-26: 2 via ORAL
  Filled 2021-04-26: qty 2

## 2021-04-26 MED ORDER — TETANUS-DIPHTH-ACELL PERTUSSIS 5-2.5-18.5 LF-MCG/0.5 IM SUSY
0.5000 mL | PREFILLED_SYRINGE | Freq: Once | INTRAMUSCULAR | Status: DC
  Filled 2021-04-26: qty 0.5

## 2021-04-26 MED ORDER — MENTHOL 3 MG MT LOZG
1.0000 | LOZENGE | OROMUCOSAL | Status: DC | PRN
  Filled 2021-04-26: qty 9

## 2021-04-26 MED ORDER — COCONUT OIL OIL: 1.0000 "application " | TOPICAL_OIL | Status: DC | PRN

## 2021-04-26 MED ORDER — WITCH HAZEL-GLYCERIN EX PADS: 1.0000 "application " | MEDICATED_PAD | CUTANEOUS | Status: DC | PRN

## 2021-04-26 MED ORDER — SIMETHICONE 80 MG PO CHEW
80.0000 mg | CHEWABLE_TABLET | ORAL | Status: DC | PRN
  Administered 2021-04-26 (×3): 80 mg via ORAL
  Filled 2021-04-26: qty 1

## 2021-04-26 MED ORDER — KETOROLAC TROMETHAMINE 30 MG/ML IJ SOLN
30.0000 mg | Freq: Four times a day (QID) | INTRAMUSCULAR | Status: AC
  Administered 2021-04-26: 30 mg via INTRAVENOUS
  Filled 2021-04-26: qty 1

## 2021-04-26 MED ORDER — ACETAMINOPHEN 325 MG PO TABS
650.0000 mg | ORAL_TABLET | ORAL | Status: DC | PRN
  Administered 2021-04-26: 650 mg via ORAL
  Filled 2021-04-26: qty 2

## 2021-04-26 MED ORDER — LACTATED RINGERS IV SOLN: INTRAVENOUS | Status: DC

## 2021-04-26 MED ORDER — DIPHENHYDRAMINE HCL 25 MG PO CAPS: 25.0000 mg | ORAL_CAPSULE | Freq: Four times a day (QID) | ORAL | Status: DC | PRN

## 2021-04-26 NOTE — Anesthesia Postprocedure Evaluation (Signed)
Anesthesia Post Note ? ?Patient: Sarah Burke ? ?Procedure(s) Performed: CESAREAN SECTION ? ?Patient location during evaluation: Mother Baby ?Anesthesia Type: Epidural ?Level of consciousness: oriented and awake and alert ?Pain management: pain level controlled ?Vital Signs Assessment: post-procedure vital signs reviewed and stable ?Respiratory status: spontaneous breathing and respiratory function stable ?Cardiovascular status: blood pressure returned to baseline and stable ?Postop Assessment: no headache, no backache, no apparent nausea or vomiting and able to ambulate ?Anesthetic complications: no ? ? ?No notable events documented. ? ? ?Last Vitals:  ?Vitals:  ? 04/26/21 0651 04/26/21 6440  ?BP:    ?Pulse: 84 89  ?Resp:    ?Temp:    ?SpO2: 97% 96%  ?  ?Last Pain:  ?Vitals:  ? 04/26/21 0400  ?TempSrc: Oral  ?PainSc: 0-No pain  ? ? ?  ?  ?  ?  ?  ?  ? ?Stormy Fabian A ? ? ? ? ?

## 2021-04-26 NOTE — Lactation Note (Signed)
This note was copied from a baby's chart. ?Lactation Consultation Note ? ?Patient Name: Sarah Burke ?Today's Date: 04/26/2021 ?Reason for consult: Initial assessment;Early term 37-38.6wks ?Age:40 hours ? ?Maternal Data ?Does the patient have breastfeeding experience prior to this delivery?: Yes ?How long did the patient breastfeed?: approx 1 mth ? ?Feeding ?Mother's Current Feeding Choice: Breast Milk and Formula ?Nipple Type: Slow - flow ? ?LATCH Score ?Latch:  (I did not observe a feeding) ? ? Baby had recently fed at breast, encouraged mom to offer breast frequently to encourage milk production as she had low supply with last child ? ?  ? ?  ? ?  ? ?  ? ? ?Lactation Tools Discussed/Used ? [name] name and no written on white board ? ?Interventions ?Interventions: Breast feeding basics reviewed;Education ? ?Discharge ?Pump: Personal ?[place] Program: Yes ? ?Consult Status ?Consult Status: PRN ? ? ? ?Ferol Luz ?04/26/2021, 1:43 PM ? ? ? ?

## 2021-04-26 NOTE — Progress Notes (Signed)
Obstetric Postpartum/PostOperative Daily Progress Note ?Subjective:  ?41 y.o. [id number] post-operative day # 1 status post primary cesarean delivery.  She is ambulating, is tolerating po- currently eating breakfast, is voiding spontaneously.  Her pain is well controlled on PO pain medications and On Q pump. Her lochia is less than menses. She is both breast and formula feeding. ?  ?Medications ?SCHEDULED MEDICATIONS  ? ketorolac  30 mg Intravenous Q6H  ? Followed by  ? ibuprofen  600 mg Oral Q6H  ? prenatal multivitamin  1 tablet Oral Q1200  ? senna-docusate  2 tablet Oral Q24H  ? simethicone  80 mg Oral TID PC  ? Tdap  0.5 mL Intramuscular Once  ?  ?MEDICATION INFUSIONS  ? bupivacaine 0.25 % ON-Q pump DUAL CATH 400 mL    ? bupivacaine ON-Q pain pump Stopped (04/25/21 2000)  ? lactated ringers    ? oxytocin 2.5 Units/hr (04/26/21 1308)  ?  ?PRN MEDICATIONS  ?acetaminophen, coconut oil, witch hazel-glycerin **AND** dibucaine, diphenhydrAMINE, menthol-cetylpyridinium, morphine injection, oxyCODONE, simethicone, zolpidem  ? ? ?Objective:  ? ?Vitals:  ? 04/26/21 0650 04/26/21 6578 04/26/21 4696 04/26/21 0759  ?BP:    117/75  ?Pulse: 87 84 89 85  ?Resp:    18  ?Temp:    98 ?F (36.7 ?C)  ?TempSrc:    Oral  ?SpO2: 97% 97% 96% 100%  ?Weight:      ?Height:      ? ? Current Vital Signs 24h Vital Sign Ranges  ?T 98 ?F (36.7 ?C) Temp  Avg: 98.4 ?F (36.9 ?C)  Min: 97.6 ?F (36.4 ?C)  Max: 99 ?F (37.2 ?C)  ?BP 117/75 BP  Min: 76/37  Max: 152/73  ?HR 85 Pulse  Avg: 91.4  Min: 67  Max: 152  ?RR 18 Resp  Avg: 21.2  Min: 14  Max: 32  ?SaO2 100 % Room Air SpO2  Avg: 97.1 %  Min: 86 %  Max: 100 %  ?    ? 24 Hour I/O Current Shift I/O  ?Time ?Ins ?Outs 03/16 0701 - 03/17 0700 ?In: 450 [I.V.:400] ?Out: 2860 [Urine:1750] No intake/output data recorded.  ?General: NAD ?Pulmonary: no increased work of breathing ?Abdomen: non-distended, non-tender, fundus firm at level of umbilicus ?Inc: Clean/dry/intact, On Q intact ?Extremities: no edema, no  erythema, no tenderness ? ?Labs:  ?Recent Labs  ?Lab 04/23/21 ?2952 04/25/21 ?8413 04/26/21 ?0450  ?WBC 6.2 9.0 18.6*  ?HGB 10.8* 10.4* 8.5*  ?HCT 34.3* 33.1* 26.5*  ?PLT 310 285 259  ? ? ? ?Assessment:  ? 41 y.o. K4M0102 postoperative day # 1 status post primary cesarean section, lactating ? ?Plan:  ?1) Acute blood loss anemia - hemodynamically stable and asymptomatic ?- po ferrous sulfate ? ?2) A POS / Rubella 1.38 (09/06 0953)/ Varicella Immune ? ?3) TDAP status Up to date ? ?4) Breast and Formula feeding ? ?5) Contraception = bilateral tubal ligation done at time of c/s ? ?6) CHTN: BP is currently normal, start Procardia as needed ? ?7) Disposition: continue current care ? ? ?Tresea Mall, CNM ?04/26/2021 9:13 AM  ?  ?

## 2021-04-27 MED ORDER — IRON POLYSACCH CMPLX-B12-FA 150-0.025-1 MG PO CAPS: ORAL_CAPSULE | ORAL | 0 refills | Status: DC

## 2021-04-27 MED ORDER — OXYCODONE-ACETAMINOPHEN 5-325 MG PO TABS: 1.0000 | ORAL_TABLET | ORAL | 0 refills | Status: AC | PRN

## 2021-04-27 NOTE — Plan of Care (Signed)
Patient Appropriate for discharge  ?

## 2021-04-27 NOTE — Discharge Instructions (Signed)
Also, take COLACE twice daily for stool softener and to prevent constipation ?   Take prenatal vitamin daily ?

## 2021-04-27 NOTE — Discharge Summary (Signed)
?OB Discharge Summary  ?   ?Patient Name: Sarah Burke ?DOB: [birth year] ?MRN: [id number] ? ?Date of admission: 04/23/2021 ?Delivering MD: Hoyt Koch, MD  ?Date of Delivery: 04/23/2021  ?Date of discharge: 04/27/2021 ? ?Admitting diagnosis: Encounter for induction of labor [Z34.90] ?Intrauterine pregnancy: [redacted]w[redacted]d    ?Secondary diagnosis:  Breech, Chronic Hypertension, Cervical incompetence, Obesity ?    ?Discharge diagnosis: Term Pregnancy Delivered and CHTN, Reasons for cesarean section  Malpresentation breech , Cervical incompetence, Obesity                     ? ?Hospital course:  Induction of Labor With Cesarean Section   ?41y.o. yo G3P2102 at 337w2das admitted to the hospital 04/23/2021 for induction of labor. Patient had a labor course significant for finding that she was breech during the course of labor management. The patient went for cesarean section due to Malpresentation. Delivery details are as follows: ?Membrane Rupture Time/Date: 7:13 AM ,04/25/2021   ?Delivery Method:C-Section, Low Transverse  ?Details of operation can be found in separate operative Note.  Patient had an uncomplicated postpartum course. She is ambulating, tolerating a regular diet, passing flatus, and urinating well.  Patient is discharged home in stable condition on 04/27/21.     ? ?Newborn Data: ?Birth date:04/25/2021  ?Birth time:6:23 PM  ?Gender:Female  ?Living status:Living  ?Apgars:8 ,9  ?Weight:2680 g                                                                                              ? ?Post partum procedures:none ? ?Complications: None ? ?Physical exam on 04/27/2021: ?Vitals:  ? 04/26/21 2053 04/27/21 0008 04/27/21 0345 04/27/21 0815  ?BP: (!) 141/81 133/69 129/73 122/64  ?Pulse: 98 82 77 85  ?Resp: _0 ?Temp: 97.6 ?F (36.4 ?C) 98.5 ?F (36.9 ?C) 98 ?F (36.7 ?C) 98.3 ?F (36.8 ?C)  ?TempSrc: Oral Oral Oral Oral  ?SpO2: 100% 100% 100% 98%  ?Weight:      ?Height:      ? ?General: alert, cooperative, and no  distress ?Lochia: appropriate ?Uterine Fundus: firm ?Incision: Healing well with no significant drainage, No significant erythema, Dressing is clean, dry, and intact ?DVT Evaluation: No evidence of DVT seen on physical exam. ?Negative Homan's sign. ?No cords or calf tenderness. ?No significant calf/ankle edema. ? ?Labs: ?Lab Results  ?Component Value Date  ? WBC 18.6 (H) 04/26/2021  ? HGB 8.5 (L) 04/26/2021  ? HCT 26.5 (L) 04/26/2021  ? MCV 82.0 04/26/2021  ? PLT 259 04/26/2021  ? ?CMP Latest Ref Rng & Units 04/23/2021  ?Glucose 70 - 99 mg/dL 98  ?BUN 6 - 20 mg/dL 7  ?Creatinine 0.44 - 1.00 mg/dL 0.59  ?Sodium 135 - 145 mmol/L 134(L)  ?Potassium 3.5 - 5.1 mmol/L 3.8  ?Chloride 98 - 111 mmol/L 106  ?CO2 22 - 32 mmol/L 22  ?Calcium 8.9 - 10.3 mg/dL 9.1  ?Total Protein 6.5 - 8.1 g/dL 7.1  ?Total Bilirubin 0.3 - 1.2 mg/dL 0.5  ?Alkaline Phos 38 - 126 U/L 107  ?AST 15 - 41 U/L  14(L)  ?ALT 0 - 44 U/L 13  ? ? ?Discharge instruction: per After Visit Summary. ? ?Medications:  ?Allergies as of 04/27/2021   ?No Known Allergies ?  ? ?  ?Medication List  ?  ? ?STOP taking these medications   ? ?Accu-Chek Guide Me w/Device Kit ?  ?Accu-Chek Guide test strip ?Generic drug: glucose blood ?  ?Accu-Chek Softclix Lancets lancets ?  ?aspirin EC 81 MG tablet ?  ? ?  ? ?TAKE these medications   ? ?acetaminophen 500 MG tablet ?Commonly known as: TYLENOL ?Take 500 mg by mouth every 8 (eight) hours as needed for moderate pain. ?  ?Iron Polysacch Cmplx-B12-FA 150-0.025-1 MG Caps ?Take on pill po daily ?  ?oxyCODONE-acetaminophen 5-325 MG tablet ?Commonly known as: Percocet ?Take 1 tablet by mouth every 4 (four) hours as needed for severe pain. ?  ?prenatal multivitamin Tabs tablet ?Take 1 tablet by mouth daily. ?  ?valACYclovir 500 MG tablet ?Commonly known as: VALTREX ?Take 1 tablet (500 mg total) by mouth 2 (two) times daily. ?  ? ?  ? ?  ?  ? ? ?  ?Discharge Care Instructions  ?(From admission, onward)  ?  ? ? ?  ? ?  Start     Ordered  ?  04/27/21 0000  If the dressing is still on your incision site when you go home, remove it on the third day after your surgery date. Remove dressing if it begins to fall off, or if it is dirty or damaged before the third day.       ? 04/27/21 1029  ? ?  ?  ? ?  ? ? ?Diet: routine diet ? ?Activity: Advance as tolerated. Pelvic rest for 6 weeks.  ? ?Outpatient follow up: ? Follow-up Information   ? ? Gae Dry, MD. Schedule an appointment as soon as possible for a visit in 2 week(s).   ?Specialty: Obstetrics and Gynecology ?Why: For Post Op ?Contact information: ?[address] ?[place] [address] ?[phone] ? ? ?  ?  ? ?  ?  ? ?  ?   ? ?Postpartum contraception: Tubal Ligation ?Rhogam Given postpartum: no ?Rubella vaccine given postpartum: no ?Varicella vaccine given postpartum: no ?TDaP given antepartum or postpartum: Yes ? ?Newborn Data: ?Live born female  ?Birth Weight: 5 lb 14.5 oz (2680 g) ?APGAR: 8, 9 ? ?Newborn Delivery   ?Birth date/time: 04/25/2021 18:23:00 ?Delivery type: C-Section, Low Transverse ?Trial of labor: Yes ?C-section categorization: Primary ?  ?  ? ? ?Baby Feeding: Breast ? ?Disposition:home with mother ? ?SIGNED: ?Hoyt Koch, MD ?04/27/2021 10:35 AM  ? ?

## 2021-04-29 ENCOUNTER — Telehealth: Payer: Self-pay

## 2021-04-29 LAB — SURGICAL PATHOLOGY

## 2021-04-29 NOTE — Telephone Encounter (Signed)
Transition Care Management Follow-up Telephone Call ?Date of discharge and from where: 04/27/2021 from [facility] ?How have you been since you were released from the hospital? Patient stated that she is feeling okay but has noticed some swelling in her feet and thighs. Encouraged to reach out to [facility] for recommendations. Patient is eating and drinking without issue and able to void and have BM's without issues.  ?Any questions or concerns? No ? ?Items Reviewed: ?Did the pt receive and understand the discharge instructions provided? Yes  ?Medications obtained and verified? Yes  ?Other? No  ?Any new allergies since your discharge? No  ?Dietary orders reviewed? No ?Do you have support at home? Yes  ? ?Functional Questionnaire: (I = Independent and D = Dependent) ?ADLs: I ? ?Bathing/Dressing- I ? ?Meal Prep- I ? ?Eating- I ? ?Maintaining continence- I ? ?Transferring/Ambulation- I ? ?Managing Meds- I ? ? ?Follow up appointments reviewed: ? ?PCP Hospital f/u appt confirmed? No   ?Specialist Hospital f/u appt confirmed? Yes  Scheduled to see Velora Mediate, MD on 05/02/2021 @ 10:35am. ?Are transportation arrangements needed? No  ?If their condition worsens, is the pt aware to call PCP or go to the Emergency Dept.? Yes ?Was the patient provided with contact information for the PCP's office or ED? Yes ?Was to pt encouraged to call back with questions or concerns? Yes ? ?

## 2021-05-01 ENCOUNTER — Encounter: Payer: Medicaid Other | Admitting: Family Medicine

## 2021-05-02 ENCOUNTER — Ambulatory Visit (INDEPENDENT_AMBULATORY_CARE_PROVIDER_SITE_OTHER): Payer: Medicaid Other | Admitting: Obstetrics & Gynecology

## 2021-05-02 ENCOUNTER — Other Ambulatory Visit: Payer: Self-pay

## 2021-05-02 ENCOUNTER — Encounter: Payer: Self-pay | Admitting: Obstetrics & Gynecology

## 2021-05-02 VITALS — BP 120/80 | Ht 62.0 in | Wt 245.0 lb

## 2021-05-02 DIAGNOSIS — Z98891 History of uterine scar from previous surgery: Secondary | ICD-10-CM | POA: Insufficient documentation

## 2021-05-02 DIAGNOSIS — Z9851 Tubal ligation status: Secondary | ICD-10-CM | POA: Insufficient documentation

## 2021-05-02 NOTE — Progress Notes (Signed)
?  Postoperative Follow-up ?Patient presents post op from recent Cesarean Section performed for Elective repeat and Tubal for sterility , 1 week ago. ? ? ?Subjective: ?Patient reports some improvement in her immediate post op symptoms. Eating a regular diet without difficulty. Pain is controlled without any medications.  Activity: normal activities of daily living. Patient reports additional symptom's since surgery of appropriate lochia, no signs of depression, and no signs of mastitis. ? ?Objective: ?BP 120/80   Ht 5\' 2"  (1.575 m)   Wt 245 lb (111.1 kg)   LMP 08/08/2020   Breastfeeding Yes   BMI 44.81 kg/m?  ?Physical Exam ?Constitutional:   ?   General: She is not in acute distress. ?   Appearance: She is well-developed.  ?Cardiovascular:  ?   Rate and Rhythm: Normal rate.  ?Pulmonary:  ?   Effort: Pulmonary effort is normal.  ?Abdominal:  ?   General: There is no distension.  ?   Palpations: Abdomen is soft.  ?   Tenderness: There is no abdominal tenderness.  ?   Comments: Incision Healing Well ?  ?Musculoskeletal:     ?   General: Normal range of motion.  ?Neurological:  ?   Mental Status: She is alert and oriented to person, place, and time.  ?   Cranial Nerves: No cranial nerve deficit.  ?Skin: ?   General: Skin is warm and dry.  ? ? ?Assessment: ?s/p : Cesarean Section for Elective repeat and tubal for sterility  progressing well ? ?Plan: ?Patient has done well after her Cesarean Section with no apparent complications.  I have discussed the post-operative course to date, and the expected progress moving forward.  The patient understands what complications to be concerned about.  I will see the patient in routine follow up, or sooner if needed.   ? ?Activity plan: No heavy lifting.08/10/2020  Pelvic rest. ?She desires  (has had BTL)  for postpartum contraception. ?No s/sx PPD.  Edinburgh 0. ?Breast feeding, going well. ? ?Marland Kitchen ?05/02/2021, 11:01 AM ? ? ?

## 2021-05-08 ENCOUNTER — Encounter: Payer: Medicaid Other | Admitting: Obstetrics & Gynecology

## 2021-05-11 DIAGNOSIS — Z419 Encounter for procedure for purposes other than remedying health state, unspecified: Secondary | ICD-10-CM | POA: Diagnosis not present

## 2021-06-07 ENCOUNTER — Ambulatory Visit (INDEPENDENT_AMBULATORY_CARE_PROVIDER_SITE_OTHER): Payer: Medicaid Other | Admitting: Licensed Practical Nurse

## 2021-06-07 DIAGNOSIS — Z131 Encounter for screening for diabetes mellitus: Secondary | ICD-10-CM

## 2021-06-07 DIAGNOSIS — D508 Other iron deficiency anemias: Secondary | ICD-10-CM

## 2021-06-07 NOTE — Progress Notes (Signed)
?Postpartum Visit  ?Chief Complaint:  ?Chief Complaint  ?Patient presents with  ? Postpartum Care  ? ? ?History of Present Illness: Patient is a 41 y.o. K2H0623 presents for postpartum visit. ? ?Date of delivery: 04/25/2021 ?Type of delivery: c/s with BTL ?Episiotomy No.  ?Laceration: low transverse incision   ?Pregnancy or labor problems:  breech at 9 cm ?Any problems since the delivery:  no ?Bled for a few days after birth then stopped, has had heavy bleeding x 2days, she is wearing depends, is ok with this amount of bleeding.  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 Was on Metformin for insulin resistance, was told a few years ago she probably has PCOS. May consider weight loss surgery at some point.  ?Breast Cancer: Her maternal grandmother and aunt had had breast cancer, unsure of age of diagnosis, her aunt had genetic testing that was negative.  ?Sleep: getting good sleep ?Mood: has been good, no concerns ?No concerns for voiding or BM's ? ?Newborn Details:  ?SINGLETON :  ?1. Baby's name: . Birth weight: 5lbs 15oz ?Maternal Details:  ?Breast Feeding:  no ?Post partum depression/anxiety noted:  no ?Edinburgh Post-Partum Depression Score:  0  ?Date of last PAP: 10/16/2020  normal  ? ?Past Medical History:  ?Diagnosis Date  ? Anxiety   ? Back pain   ? Chest pain   ? Constipation   ? Edema, lower extremity   ? GERD (gastroesophageal reflux disease)   ? High cholesterol   ? History of PCOS   ? Hypertension   ? Infertility, female   ? Pre-diabetes   ? Prediabetes   ? Shortness of breath   ? ? ?Past Surgical History:  ?Procedure Laterality Date  ? CERVICAL CERCLAGE N/A 11/15/2020  ? Procedure: CERCLAGE CERVICAL;  Surgeon: Natale Milch, MD;  Location: [facility] ORS;  Service: Gynecology;  Laterality: N/A;  ? CESAREAN SECTION  04/25/2021  ? Procedure: CESAREAN SECTION;   Surgeon: Nadara Mustard, MD;  Location: [facility] ORS;  Service: Obstetrics;;  ? DILATION AND CURETTAGE OF UTERUS    ? ? ?Prior to Admission medications   ?Medication Sig Start Date End Date Taking? Authorizing Provider  ?acetaminophen (TYLENOL) 500 MG tablet Take 500 mg by mouth every 8 (eight) hours as needed for moderate pain.   Yes [provider]  ?Iron Polysacch Cmplx-B12-FA 150-0.025-1 MG CAPS Take on pill po daily 04/27/21  Yes Nadara Mustard, MD  ?oxyCODONE-acetaminophen (PERCOCET) 5-325 MG tablet Take 1 tablet by mouth every 4 (four) hours as needed for severe pain. ?Patient not taking: Reported on 06/07/2021 04/27/21 04/27/22  Nadara Mustard, MD  ?Prenatal Vit-Fe Fumarate-FA (PRENATAL MULTIVITAMIN) TABS tablet Take 1 tablet by mouth daily. ?Patient not taking: Reported on 06/07/2021    [provider]  ?valACYclovir (VALTREX) 500 MG tablet Take 1 tablet (500 mg total) by mouth 2 (two) times daily. ?Patient not taking: Reported on 06/07/2021 03/21/21   Natale Milch, MD  ? ? ?No Known Allergies  ? ?Social History  ? ?Socioeconomic History  ? Marital status: Single  ?  Spouse name: Not on file  ? Number of children: 1  ? Years of education: Not on file  ? Highest education level: Associate degree: occupational, Scientist, product/process development, or vocational program  ?Occupational History  ? Occupation: homemaker  ?Tobacco Use  ?  Smoking status: Never  ? Smokeless tobacco: Never  ?Vaping Use  ? Vaping Use: Never used  ?Substance and Sexual Activity  ? Alcohol use: Not Currently  ?  Comment: occassionally, but not whilepreg  ? Drug use: Never  ? Sexual activity: Not Currently  ?  Partners: Male  ?  Birth control/protection: None  ?  Comment: BTL  ?Other Topics Concern  ? Not on file  ?Social History Narrative  ? 40yo daugher - Belva Crome   ? ?Social Determinants of Health  ? ?Financial Resource Strain: Not on file  ?Food Insecurity: Not on file  ?Transportation Needs: Not on file  ?Physical Activity: Not on file   ?Stress: Not on file  ?Social Connections: Not on file  ?Intimate Partner Violence: Not on file  ? ? ?Family History  ?Problem Relation Age of Onset  ? Hypertension Mother   ? Obesity Mother   ? Diabetes Brother   ? Hypertension Father   ? Hyperlipidemia Father   ? Heart disease Father   ? Hypertension Maternal Grandmother   ? Hypercholesterolemia Maternal Grandmother   ? Breast cancer Maternal Grandmother   ? Breast cancer Maternal Aunt   ? Breast cancer Maternal Aunt   ? ? ?Review of Systems  ?Constitutional:  Negative for chills, fever, malaise/fatigue and weight loss.  ?Respiratory: Negative.    ?Cardiovascular: Negative.   ?Gastrointestinal: Negative.   ?Genitourinary:   ?     Heavy vaginal bleeding   ?Neurological: Negative.   ?Endo/Heme/Allergies: Negative.   ?Psychiatric/Behavioral: Negative.     ? ?Physical Exam ?BP 120/74   Ht 5\' 2"  (1.575 m)   Wt 245 lb (111.1 kg)   LMP 06/05/2021 (Approximate)   Breastfeeding No   BMI 44.81 kg/m?   ?Physical Exam ?Constitutional:   ?   Appearance: Normal appearance.  ?Genitourinary:  ?   Genitourinary Comments: Declined exam   ?Cardiovascular:  ?   Rate and Rhythm: Normal rate and regular rhythm.  ?   Pulses: Normal pulses.  ?   Heart sounds: Normal heart sounds.  ?Pulmonary:  ?   Effort: Pulmonary effort is normal.  ?   Breath sounds: Normal breath sounds.  ?Chest:  ?   Comments: Breasts: soft, no masses or discoloration, nipples erect and intact bilaterally  ?Abdominal:  ?   General: Abdomen is flat.  ?   Tenderness: There is no abdominal tenderness.  ?   Comments: Uterus not palpable in abd. ?Incision well healed   ?Musculoskeletal:     ?   General: Normal range of motion.  ?   Cervical back: Normal range of motion.  ?Neurological:  ?   General: No focal deficit present.  ?   Mental Status: She is alert.  ?Skin: ?   General: Skin is warm.  ?Psychiatric:     ?   Mood and Affect: Mood normal.  ?  ? ? ?Assessment: 41 y.o. 24 presenting for 6 week postpartum  visit ? ?Plan: ?Problem List Items Addressed This Visit   ?None ?Visit Diagnoses   ? ? Postpartum exam    -  Primary  ? Relevant Orders  ? CBC w/Diff/Platelet  ? Hemoglobin A1c  ? Other iron deficiency anemia      ? Relevant Orders  ? CBC w/Diff/Platelet  ? Screening for diabetes mellitus      ? Relevant Orders  ? Hemoglobin A1c  ? ?  ? ? ? ?1) Contraception BTL. ? ?2)  Pap ?- ASCCP guidelines and  rational discussed.  Patient opts for 5 screening interval due 2027 ? ?3) Patient underwent screening for postpartum depression with NO concerns noted. ? ?4) Follow up 1 year for routine annual exam ? ?Carie CaddyLydia Darriel Utter, CNM  ?[facility]  ?06/07/21  ?1:00 PM  ? ?

## 2021-06-10 DIAGNOSIS — Z419 Encounter for procedure for purposes other than remedying health state, unspecified: Secondary | ICD-10-CM | POA: Diagnosis not present

## 2021-07-03 DIAGNOSIS — N898 Other specified noninflammatory disorders of vagina: Secondary | ICD-10-CM | POA: Diagnosis not present

## 2021-07-03 DIAGNOSIS — R350 Frequency of micturition: Secondary | ICD-10-CM | POA: Diagnosis not present

## 2021-07-10 ENCOUNTER — Ambulatory Visit (INDEPENDENT_AMBULATORY_CARE_PROVIDER_SITE_OTHER): Payer: Medicaid Other | Admitting: Licensed Practical Nurse

## 2021-07-10 ENCOUNTER — Other Ambulatory Visit (HOSPITAL_COMMUNITY)
Admission: RE | Admit: 2021-07-10 | Discharge: 2021-07-10 | Disposition: A | Discharge status: Home / Self Care | Payer: Medicaid Other | Source: Ambulatory Visit | Attending: Licensed Practical Nurse | Admitting: Licensed Practical Nurse

## 2021-07-10 VITALS — BP 122/70 | Ht 62.0 in | Wt 246.0 lb

## 2021-07-10 DIAGNOSIS — N898 Other specified noninflammatory disorders of vagina: Secondary | ICD-10-CM | POA: Insufficient documentation

## 2021-07-10 DIAGNOSIS — Z113 Encounter for screening for infections with a predominantly sexual mode of transmission: Secondary | ICD-10-CM

## 2021-07-10 DIAGNOSIS — Z131 Encounter for screening for diabetes mellitus: Secondary | ICD-10-CM | POA: Diagnosis not present

## 2021-07-10 DIAGNOSIS — D508 Other iron deficiency anemias: Secondary | ICD-10-CM

## 2021-07-10 DIAGNOSIS — R519 Headache, unspecified: Secondary | ICD-10-CM

## 2021-07-10 NOTE — Patient Instructions (Signed)
Prevent headache" Magnesium 400-600mg  daily Riboflavin 400mg  daily Co enzymes q 10 150mg  daily  For a bad headache: 2 500mg  tylenol, Sudafed or Benadryl, 1 can of cook or cup of coffee

## 2021-07-10 NOTE — Progress Notes (Signed)
Obstetrics & Gynecology Office Visit   Chief Complaint: No chief complaint on file.   History of Present Illness: Here for pelvic exam and STI screening, she was bleeding at her 6 wk visit so declined exam then  She has had vaginal irritation since May 10 and would like to be "checked out".  Around May 10 she had intense cramping and then passed something that looked like tissue, then the cramping stopped. She did have a menstrual cycle that started last Thursday and ended on Saturday.  She has had IC once, it was "fine". Denies any changes to her discharge or odor. She has had yeast and BV in the past, so wonders if she has one of those now.   Pt reports sleep is broken up d/t infant's sleep cycles, her partner tends to sleep through the night so she does all of the nighttime caring. Reports mood is good.   Has been getting frequent headaches, she feels they could be related to lack of sleep or stress.  Uses Tylenol occasionally with some relief.    Review of Systems: vaginal irritation since May 10  Denies concerns for depression   Past Medical History:  Past Medical History:  Diagnosis Date   Anxiety    Back pain    Chest pain    Constipation    Edema, lower extremity    GERD (gastroesophageal reflux disease)    High cholesterol    History of PCOS    Hypertension    Infertility, female    Pre-diabetes    Prediabetes    Shortness of breath     Past Surgical History:  Past Surgical History:  Procedure Laterality Date   CERVICAL CERCLAGE N/A 11/15/2020   Procedure: CERCLAGE CERVICAL;  Surgeon: Natale Milch, MD;  Location: [facility] ORS;  Service: Gynecology;  Laterality: N/A;   CESAREAN SECTION  04/25/2021   Procedure: CESAREAN SECTION;  Surgeon: Nadara Mustard, MD;  Location: [facility] ORS;  Service: Obstetrics;;   DILATION AND CURETTAGE OF UTERUS      Gynecologic History: Patient's last menstrual period was 08/08/2020.  Obstetric History: [id number]  Family  History:  Family History  Problem Relation Age of Onset   Hypertension Mother    Obesity Mother    Diabetes Brother    Hypertension Father    Hyperlipidemia Father    Heart disease Father    Hypertension Maternal Grandmother    Hypercholesterolemia Maternal Grandmother    Breast cancer Maternal Grandmother    Breast cancer Maternal Aunt    Breast cancer Maternal Aunt     Social History:  Social History   Socioeconomic History   Marital status: Single    Spouse name: Not on file   Number of children: 1   Years of education: Not on file   Highest education level: Associate degree: occupational, Scientist, product/process development, or vocational program  Occupational History   Occupation: homemaker  Tobacco Use   Smoking status: Never   Smokeless tobacco: Never  Vaping Use   Vaping Use: Never used  Substance and Sexual Activity   Alcohol use: Not Currently    Comment: occassionally, but not whilepreg   Drug use: Never   Sexual activity: Not Currently    Partners: Male    Birth control/protection: None    Comment: BTL  Other Topics Concern   Not on file  Social History Narrative   41yo daugher - Arts administrator    Social Determinants of Corporate investment banker  Strain: Not on file  Food Insecurity: Not on file  Transportation Needs: Not on file  Physical Activity: Not on file  Stress: Not on file  Social Connections: Not on file  Intimate Partner Violence: Not on file    Allergies:  No Known Allergies  Medications: Prior to Admission medications   Medication Sig Start Date End Date Taking? Authorizing Provider  acetaminophen (TYLENOL) 500 MG tablet Take 500 mg by mouth every 8 (eight) hours as needed for moderate pain.   Yes [provider]  Iron Polysacch Cmplx-B12-FA 150-0.025-1 MG CAPS Take on pill po daily 04/27/21  Yes Nadara Mustard, MD  oxyCODONE-acetaminophen (PERCOCET) 5-325 MG tablet Take 1 tablet by mouth every 4 (four) hours as needed for severe pain. Patient not  taking: Reported on 06/07/2021 04/27/21 04/27/22  Nadara Mustard, MD  Prenatal Vit-Fe Fumarate-FA (PRENATAL MULTIVITAMIN) TABS tablet Take 1 tablet by mouth daily. Patient not taking: Reported on 06/07/2021    [provider]  valACYclovir (VALTREX) 500 MG tablet Take 1 tablet (500 mg total) by mouth 2 (two) times daily. Patient not taking: Reported on 06/07/2021 03/21/21   Natale Milch, MD    Physical Exam Vitals:  Vitals:   07/10/21 1054  BP: 122/70   Patient's last menstrual period was 08/08/2020.  General: NAD HEENT: normocephalic, anicteric Abdomen: NABS, soft, non-tender, non-distended.  Umbilicus without lesions.  No hepatomegaly, splenomegaly or masses palpable. No evidence of hernia  Genitourinary:  External: Normal external female genitalia.  Normal urethral meatus, normal  Bartholin's and Skene's glands.    Vagina: Normal vaginal mucosa, no evidence of prolapse.    Cervix: Grossly normal in appearance, brown old-blood like discharge noted   Uterus: Non-enlarged, mobile, normal contour.  No CMT  Adnexa: ovaries non-enlarged, no adnexal masses  Rectal: deferred  Lymphatic: no evidence of inguinal lymphadenopathy Extremities: no edema, erythema, or tenderness Neurologic: Grossly intact Psychiatric: mood appropriate, affect full   Assessment: 41 y.o. [id number] here for pelvic exam    Plan: Problem List Items Addressed This Visit   None Visit Diagnoses     Screening examination for venereal disease    -  Primary   Relevant Orders   Cervicovaginal ancillary only   HEP, RPR, HIV Panel   Vaginal irritation       Relevant Orders   Cervicovaginal ancillary only   Screening for diabetes mellitus       Postpartum exam       Other iron deficiency anemia          Headaches: information for prevention and treatment given, encouraged pt to keep diary of headaches including frequency, timing and foods that  she eats. Encouraged pt to speak with partner about  caring for infant so that she can some time to herself and to rest. IF headaches persists, please return to the office.   Will call pt with abnormal results   Carie Caddy, CNM  Domingo Pulse, [facility]  07/10/21  1:27 PM

## 2021-07-11 DIAGNOSIS — Z419 Encounter for procedure for purposes other than remedying health state, unspecified: Secondary | ICD-10-CM | POA: Diagnosis not present

## 2021-07-11 LAB — CERVICOVAGINAL ANCILLARY ONLY
Bacterial Vaginitis (gardnerella): POSITIVE — AB
Candida Glabrata: NEGATIVE
Candida Vaginitis: NEGATIVE
Chlamydia: NEGATIVE
Comment: NEGATIVE
Comment: NEGATIVE
Comment: NEGATIVE
Comment: NEGATIVE
Comment: NEGATIVE
Comment: NORMAL
Neisseria Gonorrhea: NEGATIVE
Trichomonas: NEGATIVE

## 2021-07-11 LAB — CBC WITH DIFFERENTIAL/PLATELET
Basophils Absolute: 0 10*3/uL (ref 0.0–0.2)
Basos: 1 %
EOS (ABSOLUTE): 0.1 10*3/uL (ref 0.0–0.4)
Eos: 2 %
Hematocrit: 36 % (ref 34.0–46.6)
Hemoglobin: 11.3 g/dL (ref 11.1–15.9)
Immature Grans (Abs): 0 10*3/uL (ref 0.0–0.1)
Immature Granulocytes: 0 %
Lymphocytes Absolute: 1.6 10*3/uL (ref 0.7–3.1)
Lymphs: 33 %
MCH: 24.5 pg — ABNORMAL LOW (ref 26.6–33.0)
MCHC: 31.4 g/dL — ABNORMAL LOW (ref 31.5–35.7)
MCV: 78 fL — ABNORMAL LOW (ref 79–97)
Monocytes Absolute: 0.3 10*3/uL (ref 0.1–0.9)
Monocytes: 7 %
Neutrophils Absolute: 2.7 10*3/uL (ref 1.4–7.0)
Neutrophils: 57 %
Platelets: 439 10*3/uL (ref 150–450)
RBC: 4.61 x10E6/uL (ref 3.77–5.28)
RDW: 14.5 % (ref 11.7–15.4)
WBC: 4.7 10*3/uL (ref 3.4–10.8)

## 2021-07-11 LAB — HEP, RPR, HIV PANEL
HIV Screen 4th Generation wRfx: NONREACTIVE
Hepatitis B Surface Ag: NEGATIVE
RPR Ser Ql: NONREACTIVE

## 2021-07-11 LAB — HEMOGLOBIN A1C
Est. average glucose Bld gHb Est-mCnc: 120 mg/dL
Hgb A1c MFr Bld: 5.8 % — ABNORMAL HIGH (ref 4.8–5.6)

## 2021-07-12 ENCOUNTER — Other Ambulatory Visit: Payer: Self-pay | Admitting: Licensed Practical Nurse

## 2021-07-12 ENCOUNTER — Telehealth: Payer: Self-pay

## 2021-07-12 DIAGNOSIS — B3731 Acute candidiasis of vulva and vagina: Secondary | ICD-10-CM

## 2021-07-12 DIAGNOSIS — N760 Acute vaginitis: Secondary | ICD-10-CM

## 2021-07-12 MED ORDER — FLUCONAZOLE 150 MG PO TABS: 150.0000 mg | ORAL_TABLET | ORAL | 0 refills | Status: AC

## 2021-07-12 MED ORDER — METRONIDAZOLE 500 MG PO TABS: 500.0000 mg | ORAL_TABLET | Freq: Two times a day (BID) | ORAL | 0 refills | Status: DC

## 2021-07-12 NOTE — Progress Notes (Signed)
TC to [employer] reviewed. Familiar with BV, aware to not consume alcohol during and for 72 hours after treatment. Your HA1C is in the prediabetic range. Sarah Burke aware, she has been seen for weight loss in the past, she was on metformin for a while.  Sarah Burke now desires gastric sleeve surgery.  Script for Flagyl sent Referral for gastric surgery made Carie Caddy, CNM  Domingo Pulse, [facility]  07/12/21  9:32 AM

## 2021-07-12 NOTE — Telephone Encounter (Signed)
Patient contacted office stating that she spoke with Isabelle Course this morning and states that she was prescribed medication to treat for infection, patient states that when she is normal on medication she does develop a yeast infection and is requesting that we send in prescription as well to treat, patient states that she uses [facility] pharmacy in [place]. Renette Butters

## 2021-07-12 NOTE — Progress Notes (Signed)
Pt requesting script for Diflucan as she is prone to yeast infections while on antibiotics. Currently on Flagyl for BV.   Script sent Roberto Scales, [place], [facility]  07/12/21  4:00 PM

## 2021-07-30 ENCOUNTER — Other Ambulatory Visit: Payer: Self-pay | Admitting: Licensed Practical Nurse

## 2021-08-01 NOTE — Telephone Encounter (Signed)
Patient is going to wait until after she's off her cycle and then call the office.

## 2021-08-10 DIAGNOSIS — Z419 Encounter for procedure for purposes other than remedying health state, unspecified: Secondary | ICD-10-CM | POA: Diagnosis not present

## 2021-09-10 DIAGNOSIS — Z419 Encounter for procedure for purposes other than remedying health state, unspecified: Secondary | ICD-10-CM | POA: Diagnosis not present

## 2021-09-18 ENCOUNTER — Ambulatory Visit (INDEPENDENT_AMBULATORY_CARE_PROVIDER_SITE_OTHER): Payer: Medicaid Other

## 2021-09-18 ENCOUNTER — Encounter (INDEPENDENT_AMBULATORY_CARE_PROVIDER_SITE_OTHER): Payer: Self-pay

## 2021-09-18 ENCOUNTER — Other Ambulatory Visit (HOSPITAL_COMMUNITY)
Admission: RE | Admit: 2021-09-18 | Discharge: 2021-09-18 | Disposition: A | Discharge status: Home / Self Care | Payer: Medicaid Other | Source: Ambulatory Visit | Attending: Licensed Practical Nurse | Admitting: Licensed Practical Nurse

## 2021-09-18 VITALS — BP 120/76 | HR 91 | Ht 62.0 in | Wt 247.0 lb

## 2021-09-18 DIAGNOSIS — B9689 Other specified bacterial agents as the cause of diseases classified elsewhere: Secondary | ICD-10-CM | POA: Diagnosis not present

## 2021-09-18 DIAGNOSIS — N898 Other specified noninflammatory disorders of vagina: Secondary | ICD-10-CM | POA: Insufficient documentation

## 2021-09-18 DIAGNOSIS — N760 Acute vaginitis: Secondary | ICD-10-CM | POA: Diagnosis not present

## 2021-09-18 MED ORDER — METRONIDAZOLE 0.75 % VA GEL: 1.0000 | Freq: Every day | VAGINAL | 0 refills | Status: DC

## 2021-09-18 NOTE — Progress Notes (Signed)
SUBJECTIVE:  41 y.o. female complains of white, foul, and watery vaginal discharge for 2-3 week(s). Denies abnormal vaginal bleeding or significant pelvic pain or fever. No UTI symptoms. Denies history of known exposure to STD.  No LMP recorded.  OBJECTIVE:  She appears well, afebrile.  ASSESSMENT:  bacterial vaginosis  PLAN:  GC and chlamydia DNA  probe sent to lab. Treatment: MetroGel daily x 5 days and abstain from coitus during course of treatment ROV prn if symptoms persist or worsen.

## 2021-09-19 ENCOUNTER — Other Ambulatory Visit: Payer: Self-pay | Admitting: Licensed Practical Nurse

## 2021-09-19 ENCOUNTER — Encounter: Payer: Self-pay | Admitting: Licensed Practical Nurse

## 2021-09-19 DIAGNOSIS — A599 Trichomoniasis, unspecified: Secondary | ICD-10-CM

## 2021-09-19 LAB — CERVICOVAGINAL ANCILLARY ONLY
Bacterial Vaginitis (gardnerella): NEGATIVE
Candida Glabrata: NEGATIVE
Candida Vaginitis: NEGATIVE
Chlamydia: NEGATIVE
Comment: NEGATIVE
Comment: NEGATIVE
Comment: NEGATIVE
Comment: NEGATIVE
Comment: NEGATIVE
Comment: NORMAL
Neisseria Gonorrhea: NEGATIVE
Trichomonas: POSITIVE — AB

## 2021-09-19 MED ORDER — METRONIDAZOLE 500 MG PO TABS: ORAL_TABLET | ORAL | 0 refills | Status: DC

## 2021-09-19 MED ORDER — FLUCONAZOLE 150 MG PO TABS: 150.0000 mg | ORAL_TABLET | Freq: Once | ORAL | 0 refills | Status: AC

## 2021-09-19 NOTE — Progress Notes (Signed)
Labs shows + for trich Called LVM and mychart message sent Script sent to pharmacy on file Carie Caddy, [place]  [facility]  09/19/21  1:48 PM

## 2021-10-11 DIAGNOSIS — Z419 Encounter for procedure for purposes other than remedying health state, unspecified: Secondary | ICD-10-CM | POA: Diagnosis not present

## 2021-11-10 DIAGNOSIS — Z419 Encounter for procedure for purposes other than remedying health state, unspecified: Secondary | ICD-10-CM | POA: Diagnosis not present

## 2021-12-11 DIAGNOSIS — Z419 Encounter for procedure for purposes other than remedying health state, unspecified: Secondary | ICD-10-CM | POA: Diagnosis not present

## 2022-01-10 DIAGNOSIS — Z419 Encounter for procedure for purposes other than remedying health state, unspecified: Secondary | ICD-10-CM | POA: Diagnosis not present

## 2022-02-10 DIAGNOSIS — Z419 Encounter for procedure for purposes other than remedying health state, unspecified: Secondary | ICD-10-CM | POA: Diagnosis not present

## 2022-03-13 DIAGNOSIS — Z419 Encounter for procedure for purposes other than remedying health state, unspecified: Secondary | ICD-10-CM | POA: Diagnosis not present

## 2022-04-11 DIAGNOSIS — Z419 Encounter for procedure for purposes other than remedying health state, unspecified: Secondary | ICD-10-CM | POA: Diagnosis not present

## 2022-05-12 DIAGNOSIS — Z419 Encounter for procedure for purposes other than remedying health state, unspecified: Secondary | ICD-10-CM | POA: Diagnosis not present

## 2022-06-11 DIAGNOSIS — Z419 Encounter for procedure for purposes other than remedying health state, unspecified: Secondary | ICD-10-CM | POA: Diagnosis not present

## 2022-07-04 DIAGNOSIS — H5213 Myopia, bilateral: Secondary | ICD-10-CM | POA: Diagnosis not present

## 2022-07-12 DIAGNOSIS — Z419 Encounter for procedure for purposes other than remedying health state, unspecified: Secondary | ICD-10-CM | POA: Diagnosis not present

## 2022-08-11 DIAGNOSIS — Z419 Encounter for procedure for purposes other than remedying health state, unspecified: Secondary | ICD-10-CM | POA: Diagnosis not present

## 2022-09-11 DIAGNOSIS — Z419 Encounter for procedure for purposes other than remedying health state, unspecified: Secondary | ICD-10-CM | POA: Diagnosis not present

## 2022-09-29 DIAGNOSIS — N760 Acute vaginitis: Secondary | ICD-10-CM | POA: Diagnosis not present

## 2022-10-12 DIAGNOSIS — Z419 Encounter for procedure for purposes other than remedying health state, unspecified: Secondary | ICD-10-CM | POA: Diagnosis not present

## 2022-10-22 DIAGNOSIS — N898 Other specified noninflammatory disorders of vagina: Secondary | ICD-10-CM | POA: Diagnosis not present

## 2022-10-22 DIAGNOSIS — R350 Frequency of micturition: Secondary | ICD-10-CM | POA: Diagnosis not present

## 2022-11-09 IMAGING — US US MFM OB DETAIL+14 WK
1 series · 13 of 28 positions shown · non-contrast
Comparison: none

[Series 1: us mfm ob detail+14 wk · 13 of 115 slices shown]
[im 5/115]
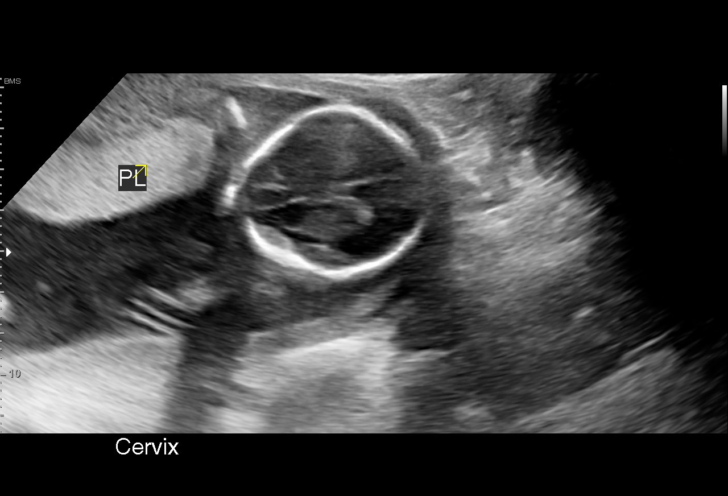
[im 13/115]
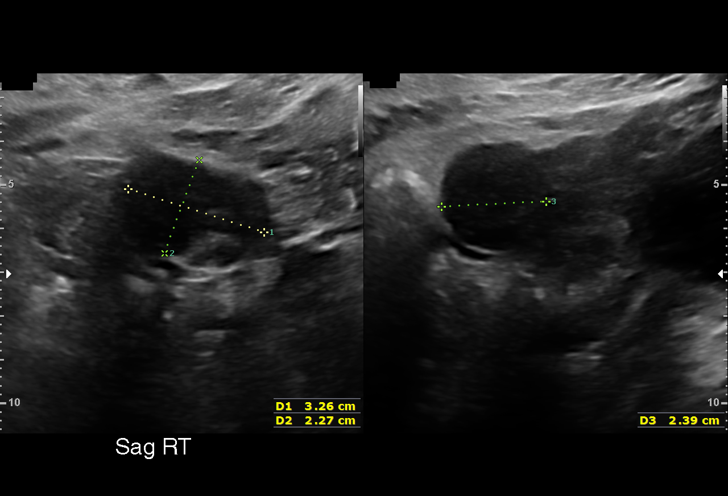
[im 22/115]
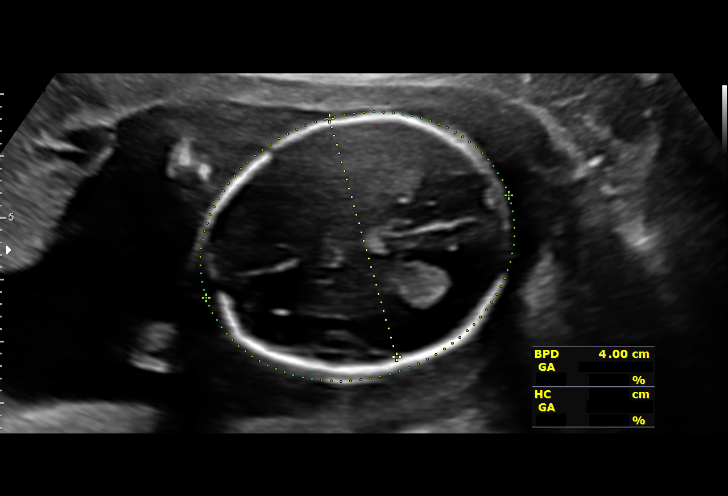
[im 30/115]
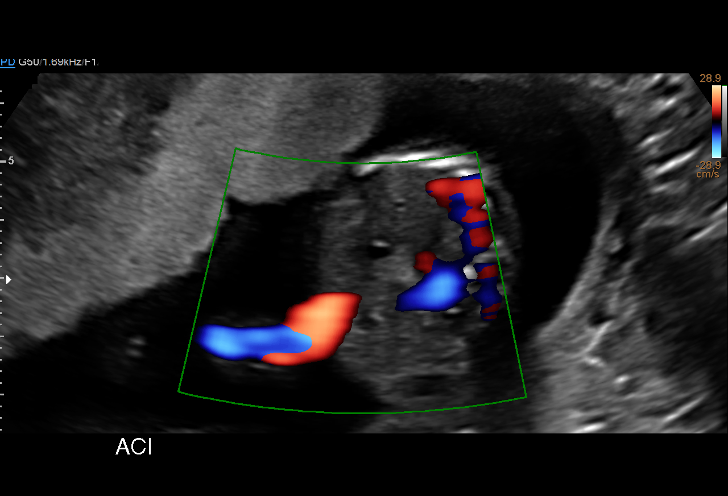
[im 39/115]
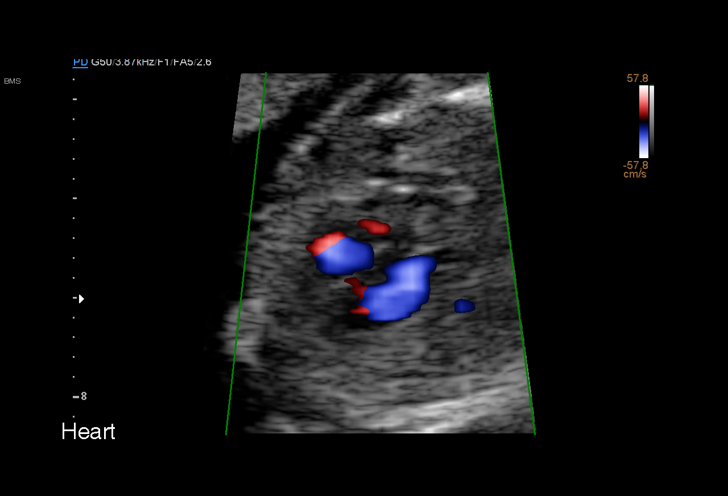
[im 47/115]
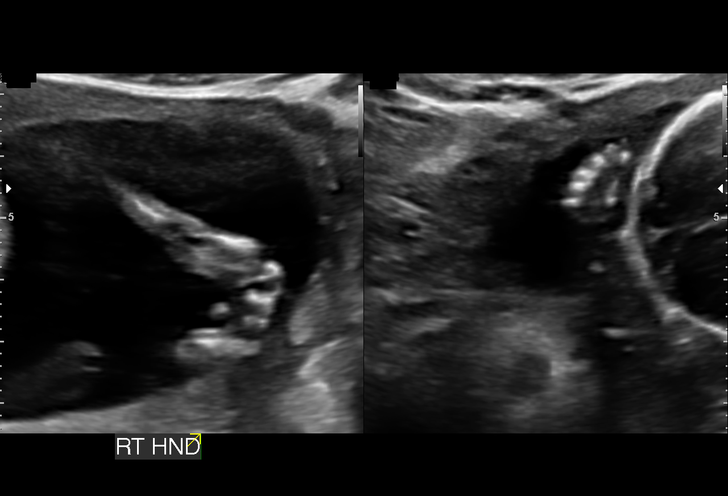
[im 60/115]
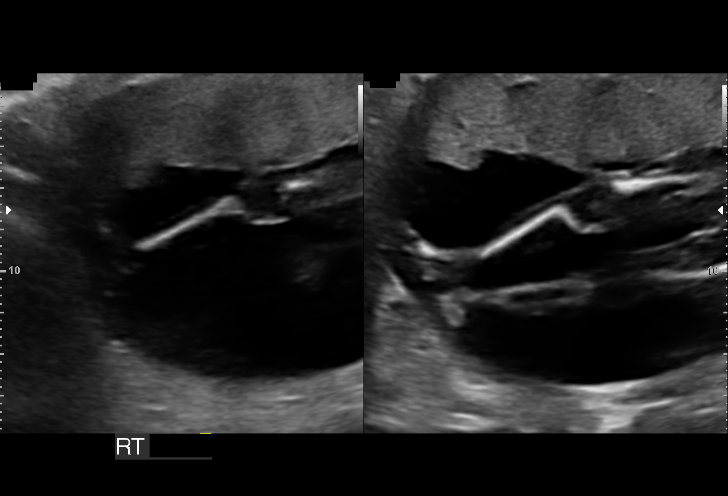
[im 68/115]
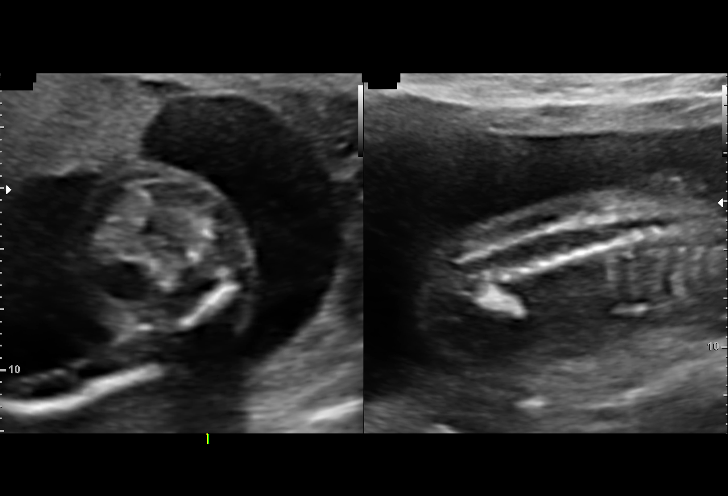
[im 77/115]
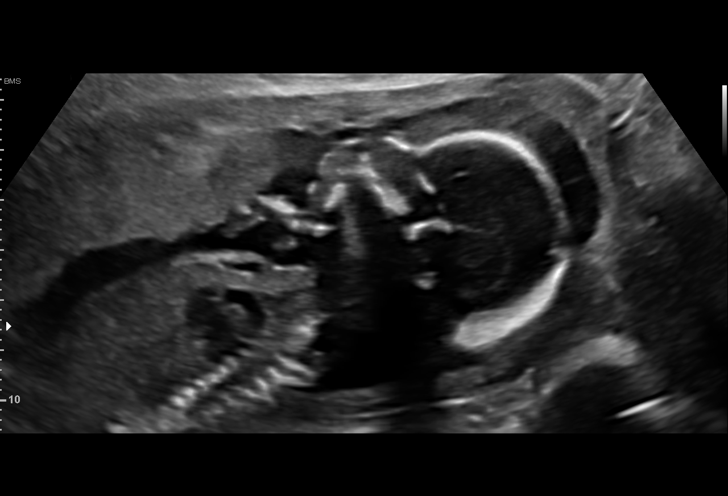
[im 85/115]
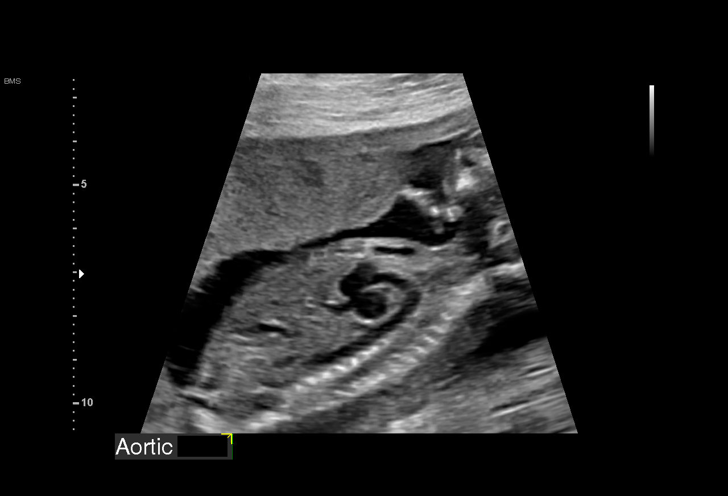
[im 93/115]
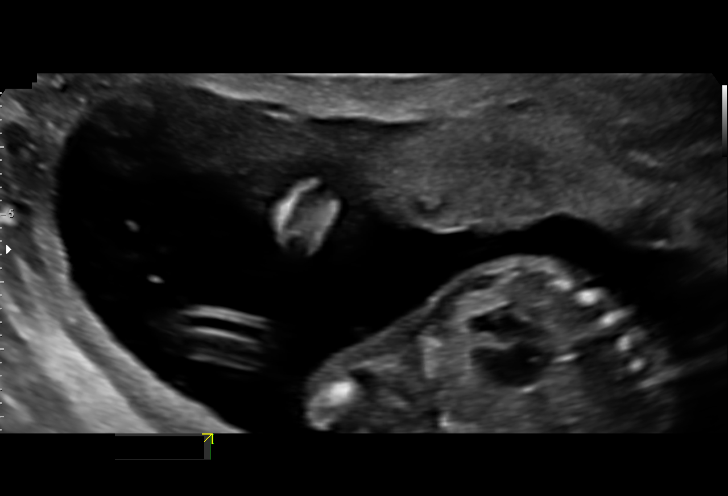
[im 102/115]
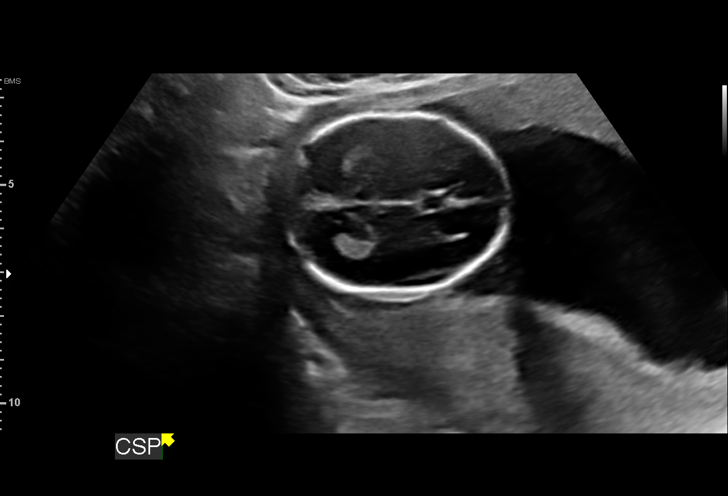
[im 110/115]
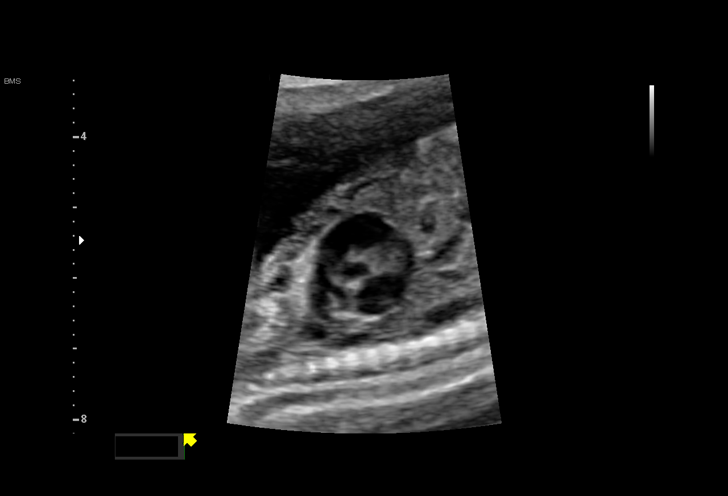

[13 of 28 positions shown; findings below may reference images not displayed]

GETER

Indications

 Advanced maternal age multigravida 35+,
 first trimester
 Hypertension - Chronic/Pre-existing (No
 meds)
 Cervical cerclage suture present, second
 trimester
 18 weeks gestation of pregnancy
 Poor obstetrical history (19-week loss)
 Medical complication of pregnancy
 (Metabolic syndrome)
 Obesity complicating pregnancy, first
 trimester (BMI 45)
 Medical complication of pregnancy (Danii
 Palsy)
 LR NIPS
Fetal Evaluation

 Num Of Fetuses:         1
 Fetal Heart Rate(bpm):  129
 Cardiac Activity:       Observed
 Presentation:           Cephalic
 Placenta:               Anterior
 P. Cord Insertion:      Visualized, central
 Amniotic Fluid
 AFI FV:      Within normal limits

                             Largest Pocket(cm)

Biometry

 BPD:        41  mm     G. Age:  18w 3d         57  %    CI:        76.06   %    70 - 86
                                                         FL/HC:      22.1   %    15.8 - 18
 HC:       149   mm     G. Age:  18w 0d         28  %    HC/AC:      1.20        1.07 -
 AC:      124.6  mm     G. Age:  18w 1d         40  %    FL/BPD:     80.5   %
 FL:         33  mm     G. Age:  20w 2d         96  %    FL/AC:      26.5   %    20 - 24
 HUM:        28  mm     G. Age:  19w 0d         74  %
 CER:      18.7  mm     G. Age:  18w 3d         54  %
 NFT:       2.3  mm
 LV:          5  mm
 CM:        5.1  mm

 Est. FW:     269  gm      0 lb 9 oz     86  %
Gestational Age

 LMP:           18w 1d        Date:  08/08/20                 EDD:   05/15/21
 U/S Today:     18w 5d                                        EDD:   05/11/21
 Best:          18w 2d     Det. By:  Early Ultrasound         EDD:   05/14/21
                                     (10/16/20)
Anatomy

 Cranium:               Appears normal         Aortic Arch:            Appears normal
 Cavum:                 Appears normal         Ductal Arch:            Appears normal
 Ventricles:            Appears normal         Diaphragm:              Appears normal
 Choroid Plexus:        Appears normal         Stomach:                Appears normal, left
                                                                       sided
 Cerebellum:            Appears normal         Abdomen:                Appears normal
 Posterior Fossa:       Appears normal         Abdominal Wall:         Appears nml (cord
                                                                       insert, abd wall)
 Nuchal Fold:           Appears normal         Cord Vessels:           Appears normal (3
                                                                       vessel cord)
 Face:                  Appears normal         Kidneys:                Appear normal
                        (orbits and profile)
 Lips:                  Appears normal         Bladder:                Appears normal
 Thoracic:              Appears normal         Spine:                  Appears normal
 Heart:                 Not well visualized    Upper Extremities:      Appears normal
 RVOT:                  Appears normal         Lower Extremities:      Appears normal
 LVOT:                  Appears normal

 Other:  Technically difficult due to maternal habitus and fetal position.
Cervix Uterus Adnexa

 Cervix
 Length:            3.6  cm.
 Normal appearance by transabdominal scan.
 Uterus
 Single fibroid noted, see table below.

 Right Ovary
 Not visualized.

 Left Ovary
 Not visualized.

 Cul De Sac
 No free fluid seen.

 Adnexa
 No adnexal mass visualized.
Myomas

 Site                     L(cm)      W(cm)      D(cm)       Location
 Right Anterior

 Blood Flow                  RI       PI       Comments

Impression

 Patient returned for fetal anatomical survey.  She has chronic
 hypertension that is well controlled without antihypertensives.
 Blood pressure today at her office is 130s/76 mmHg.
 History of midtrimester pregnancy loss.  Patient had
 prophylactic cerclage placed on 11/15/2020.  She has no
 symptoms of uterine contractions or vaginal bleeding.

 We performed fetal anatomy scan. No makers of
 aneuploidies or fetal structural defects are seen. Fetal
 biometry is consistent with her previously-established dates.
 Amniotic fluid is normal and good fetal activity is seen.
 Patient understands the limitations of ultrasound in detecting
 fetal anomalies.
 We performed transvaginal ultrasound to evaluate the cervix.
 The cervix measures 3.6 cm, which is within normal range.
 The cerclage is in place and is about 9 mm from the external
 os.

 I reassured the patient of the findings.
Recommendations

 -An appointment was made for her to return in 4 weeks for
 completion of fetal anatomy.
                 Evcimen, Levini

## 2022-11-11 DIAGNOSIS — Z419 Encounter for procedure for purposes other than remedying health state, unspecified: Secondary | ICD-10-CM | POA: Diagnosis not present

## 2022-12-12 DIAGNOSIS — Z419 Encounter for procedure for purposes other than remedying health state, unspecified: Secondary | ICD-10-CM | POA: Diagnosis not present

## 2022-12-25 DIAGNOSIS — N760 Acute vaginitis: Secondary | ICD-10-CM | POA: Diagnosis not present

## 2023-01-11 DIAGNOSIS — Z419 Encounter for procedure for purposes other than remedying health state, unspecified: Secondary | ICD-10-CM | POA: Diagnosis not present

## 2023-02-11 DIAGNOSIS — Z419 Encounter for procedure for purposes other than remedying health state, unspecified: Secondary | ICD-10-CM | POA: Diagnosis not present

## 2023-03-08 IMAGING — US US MFM FETAL BPP W/O NON-STRESS
1 series · 14 of 28 positions shown · non-contrast
Comparison: none

[Series 1: us mfm fetal bpp w/o non-stress · 36 acquisitions, 14 frames shown]
[im 2/36]
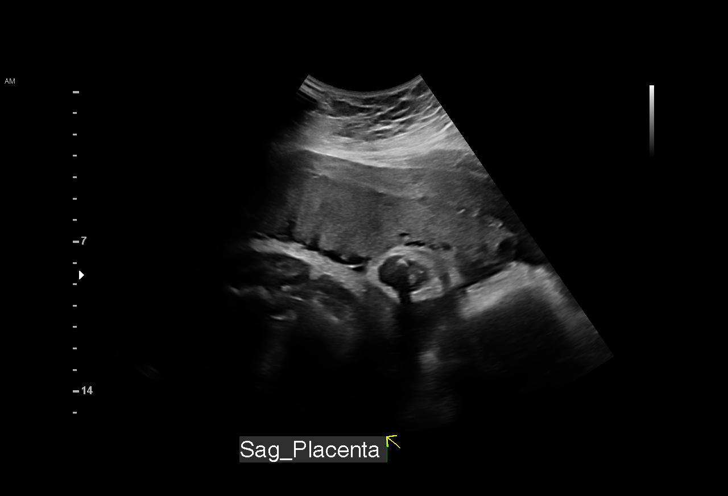
[im 4/36]
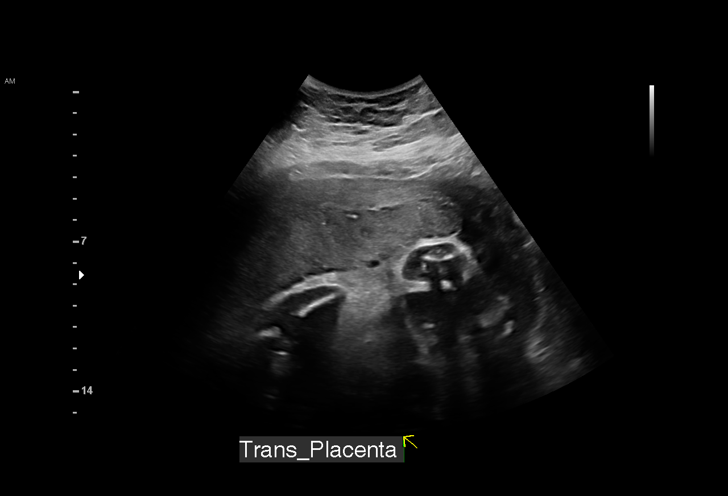
[im 7/36]
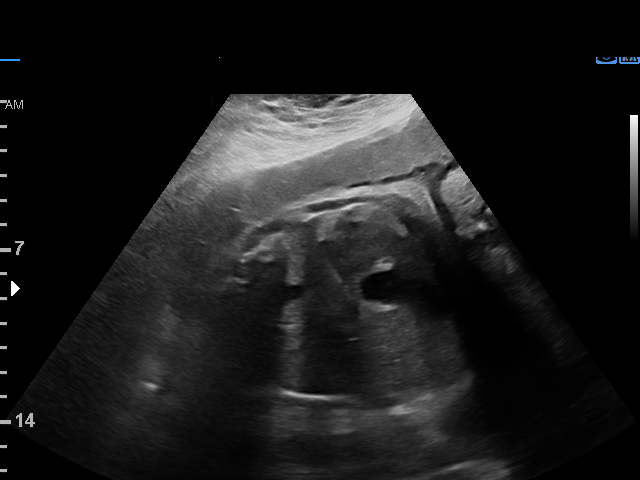
[im 10/36]
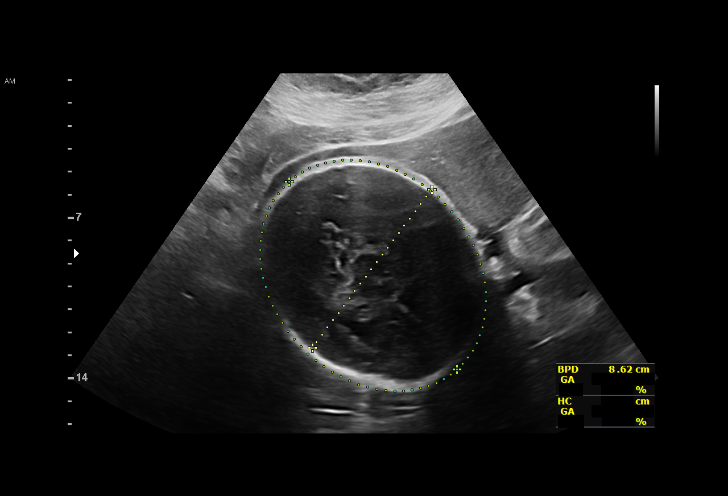
[im 12/36]
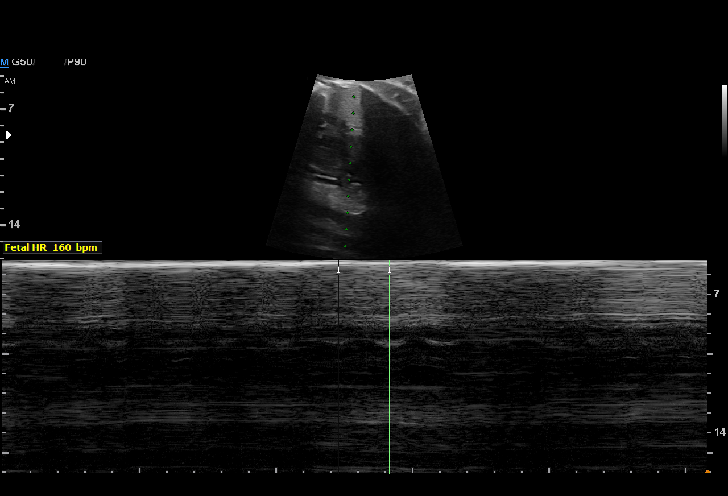
[im 15/36]
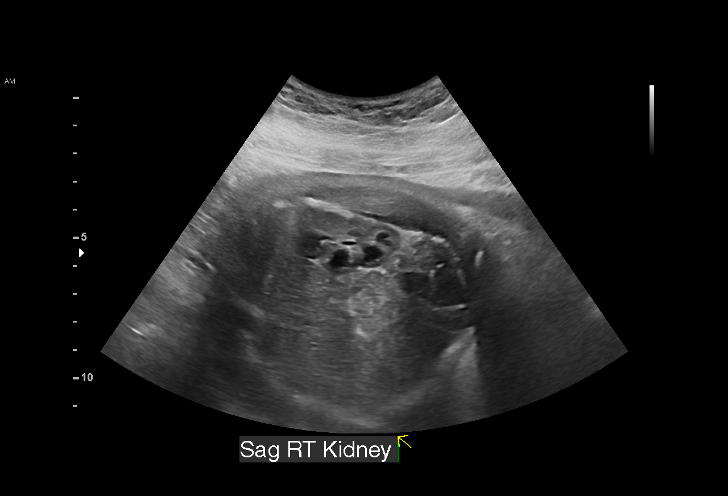
[im 17/36]
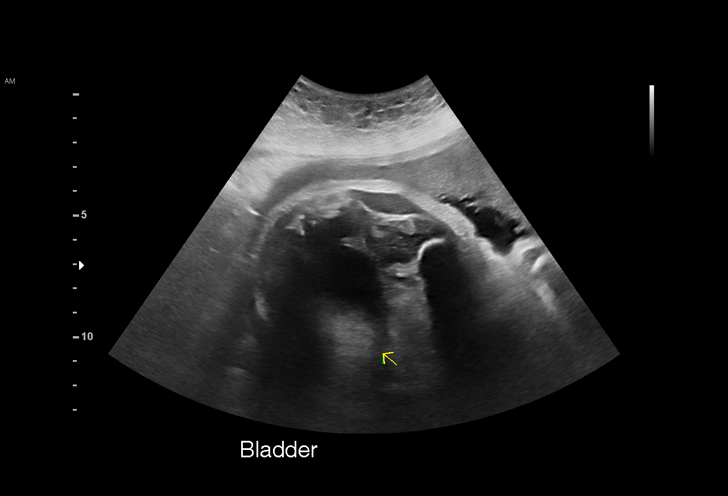
[im 20/36]
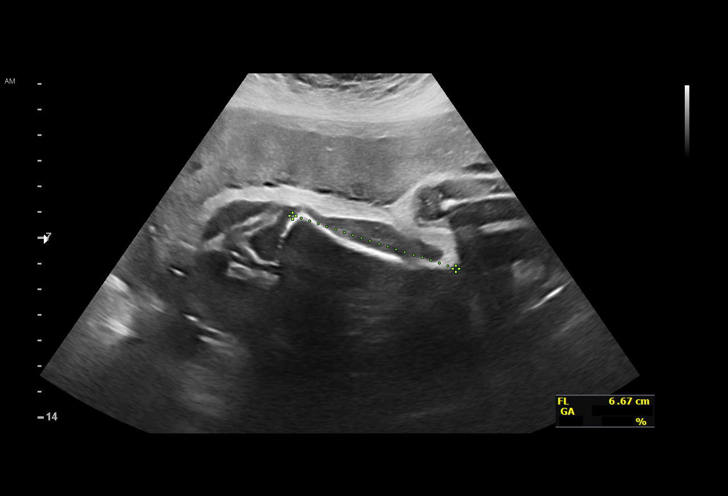
[im 23/36]
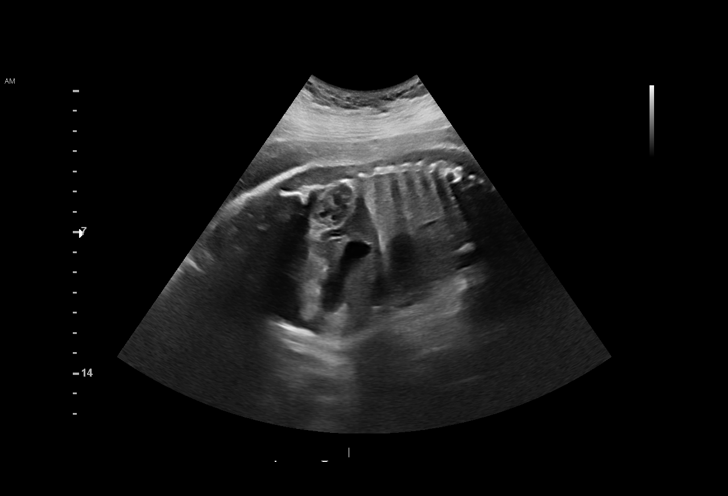
[im 25/36]
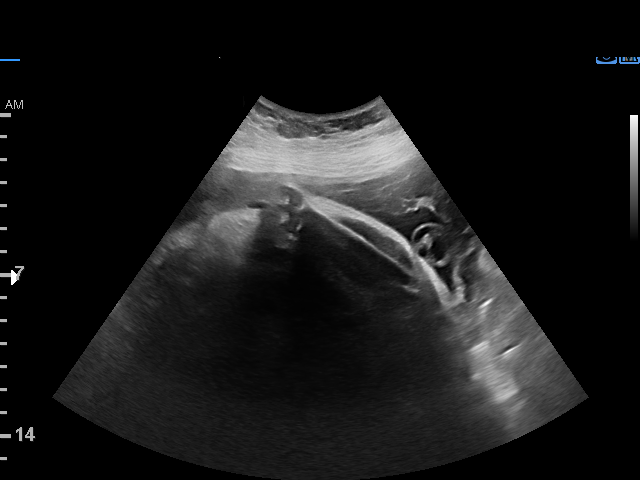
[im 28/36]
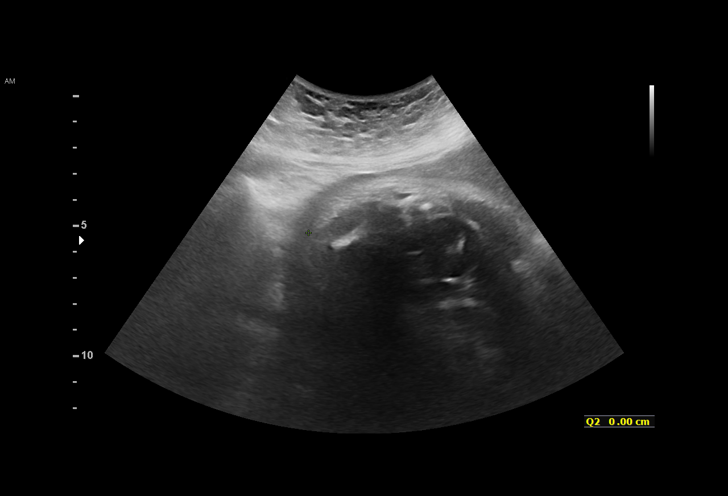
[im 30/36]
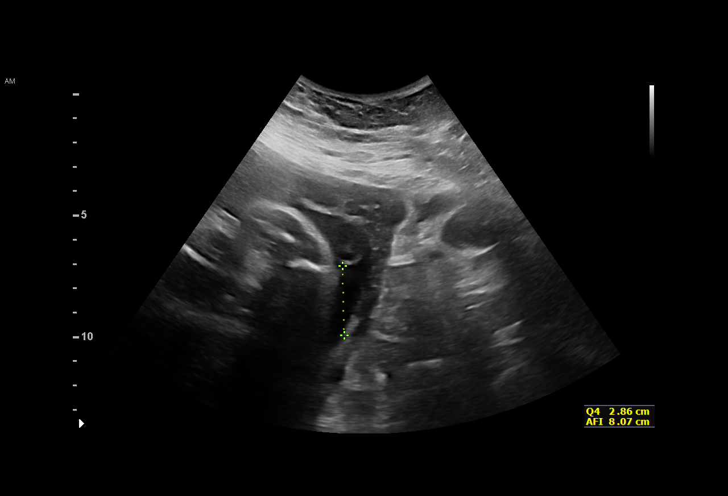
[im 33/36]
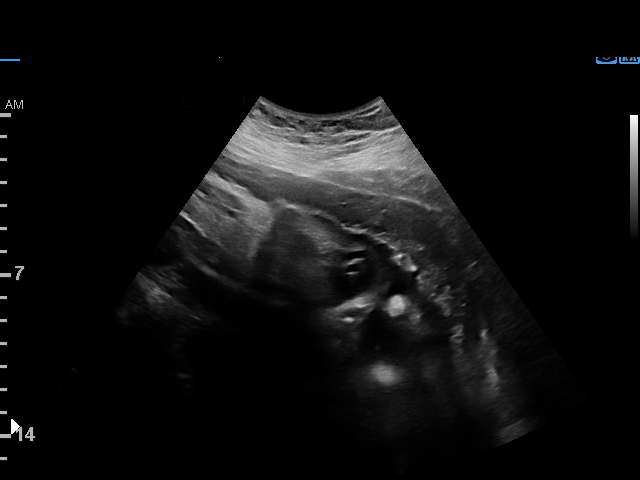
[im 36/36]
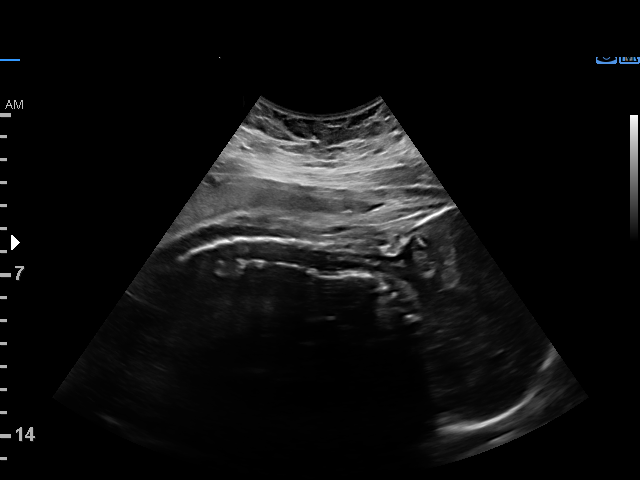

[14 of 28 positions shown; findings below may reference images not displayed]

[place]ON

Indications

 35 weeks gestation of pregnancy
 Hypertension - Chronic/Pre-existing
 Advanced maternal age multigravida 35+,
 third trimester
 Obesity complicating pregnancy, third
 trimester BMI 35
 Cervical cerclage suture present, third
 trimester
 Poor obstetrical history (19-week loss)
 Medical complication of pregnancy
 (Metabolic syndrome)
 Medical complication of pregnancy (Cardin
 Palsy)
 LR NIPS
Fetal Evaluation

 Num Of Fetuses:         1
 Fetal Heart Rate(bpm):  160
 Cardiac Activity:       Observed
 Presentation:           Cephalic
 Placenta:               Anterior
 P. Cord Insertion:      Previously Visualized
 Amniotic Fluid
 AFI FV:      Within normal limits

 AFI Sum(cm)     %Tile       Largest Pocket(cm)
 8.1             7

 RUQ(cm)       RLQ(cm)       LUQ(cm)        LLQ(cm)
 0             2.9           0
Biophysical Evaluation

 Amniotic F.V:   Within normal limits       F. Tone:        Observed
 F. Movement:    Observed                   Score:          [DATE]
 F. Breathing:   Observed
Biometry

 BPD:      86.3  mm     G. Age:  34w 6d         40  %    CI:        75.86   %    70 - 86
                                                         FL/HC:      20.9   %    20.1 -
 HC:      314.1  mm     G. Age:  35w 2d         17  %    HC/AC:      1.02        0.93 -
 AC:      308.9  mm     G. Age:  34w 6d         44  %    FL/BPD:     75.9   %    71 - 87
 FL:       65.5  mm     G. Age:  33w 5d         11  %    FL/AC:      21.2   %    20 - 24

 Est. FW:    6151  gm      5 lb 7 oz     28  %
Gestational Age

 LMP:           35w 1d        Date:  08/08/20                 EDD:   05/15/21
 U/S Today:     34w 5d                                        EDD:   05/18/21
 Best:          35w 2d     Det. By:  Early Ultrasound         EDD:   05/14/21
                                     (10/16/20)
Anatomy

 Cranium:               Appears normal         LVOT:                   Previously seen
 Cavum:                 Previously seen        Aortic Arch:            Previously seen
 Ventricles:            Previously seen        Ductal Arch:            Previously seen
 Choroid Plexus:        Previously seen        Diaphragm:              Appears normal
 Cerebellum:            Previously seen        Stomach:                Appears normal, left
                                                                       sided
 Posterior Fossa:       Previously seen        Abdomen:                Appears normal
 Nuchal Fold:           Previously seen        Abdominal Wall:         Previously seen
 Face:                  Orbits appear          Cord Vessels:           Previously seen
                        normal
 Lips:                  Previously seen        Kidneys:                Appear normal
 Palate:                Previously seen        Bladder:                Appears normal
 Thoracic:              Previously seen        Spine:                  Previously seen
 Heart:                 Previously seen        Upper Extremities:      Previously seen
 RVOT:                  Previously seen        Lower Extremities:      Previously seen

 Other:  Technically difficult due to maternal habitus and fetal position.
Cervix Uterus Adnexa

 Cervix
 Not visualized (advanced GA >21wks)
 Uterus
 No abnormality visualized.

 Right Ovary
 Not visualized.

 Left Ovary
 Not visualized.
Impression

 Follow up growth due to chronic hypertension, elevated BMI
 and AMA
 Normal interval growth with measurements consistent with
 dates
 Good fetal movement and amniotic fluid volume
 Biophysical profile [DATE]

 Blood pressure 131/75 mmgH.
Recommendations

 Continue weekly testing
 Delivery between 37-39 weeks.

## 2023-03-14 DIAGNOSIS — Z419 Encounter for procedure for purposes other than remedying health state, unspecified: Secondary | ICD-10-CM | POA: Diagnosis not present

## 2023-04-11 DIAGNOSIS — Z419 Encounter for procedure for purposes other than remedying health state, unspecified: Secondary | ICD-10-CM | POA: Diagnosis not present

## 2023-05-23 DIAGNOSIS — Z419 Encounter for procedure for purposes other than remedying health state, unspecified: Secondary | ICD-10-CM | POA: Diagnosis not present

## 2023-06-11 ENCOUNTER — Encounter: Payer: Self-pay | Admitting: Family Medicine

## 2023-06-11 ENCOUNTER — Ambulatory Visit: Admitting: Family Medicine

## 2023-06-11 VITALS — BP 129/84 | HR 83 | Temp 98.5°F | Resp 18 | Ht 62.0 in | Wt 267.0 lb

## 2023-06-11 DIAGNOSIS — F32A Depression, unspecified: Secondary | ICD-10-CM | POA: Diagnosis not present

## 2023-06-11 DIAGNOSIS — R7303 Prediabetes: Secondary | ICD-10-CM | POA: Diagnosis not present

## 2023-06-11 DIAGNOSIS — F419 Anxiety disorder, unspecified: Secondary | ICD-10-CM | POA: Diagnosis not present

## 2023-06-11 DIAGNOSIS — Z1231 Encounter for screening mammogram for malignant neoplasm of breast: Secondary | ICD-10-CM

## 2023-06-11 LAB — POCT GLYCOSYLATED HEMOGLOBIN (HGB A1C): Hemoglobin A1C: 6.4 % — AB (ref 4.0–5.6)

## 2023-06-11 MED ORDER — HYDROXYZINE HCL 10 MG PO TABS: 10.0000 mg | ORAL_TABLET | Freq: Three times a day (TID) | ORAL | 0 refills | Status: DC | PRN

## 2023-06-11 MED ORDER — OZEMPIC (0.25 OR 0.5 MG/DOSE) 2 MG/1.5ML ~~LOC~~ SOPN: PEN_INJECTOR | SUBCUTANEOUS | 1 refills | Status: DC

## 2023-06-11 NOTE — Assessment & Plan Note (Signed)
 A1c is 6.4%.  Discussed diabetes inn terms of changing diet and daily exercise.  Her insurance may allow her to have a GLP-1 with this reading.

## 2023-06-11 NOTE — Assessment & Plan Note (Signed)
 Trial of hydroxyzine  10mg  for episodic anxiety.

## 2023-06-11 NOTE — Progress Notes (Signed)
 New Patient Office Visit  Subjective    Patient ID: Sarah Burke, female    DOB: 1981-01-11  Age: 43 y.o. MRN: [id number]  CC:  Chief Complaint  Patient presents with   Establish Care   Anxiety   Weight Management Screening    HPI Sarah Burke presents to establish care Delightful 42-yo woman with anxiety/depression, obesity, GERD, borderline diabetes, Hx PCOS, Hx of Bell's palsy.  She stated "I need to get back on track with my health". She currently does not take any medication for anxiety or depression.  She would like some medicine to help her with anxiety.  She recently moved and that caused stress. She has a small claims court case coming up and feels anxiety about this. She does mediatation and that seems to help her anxiety.  She would appreciate medication to help with her anxiety.   She worked with [facility] and was able to get pregnant.  She was eating a low carb, high protein diet.  Now she has gained her weight back.  She is interested in a GLP-1.  She is also thought about getting a gastric sleeve if she cannot get on a GLP-1. She has never had a mammogram but is ready to do this.  Outpatient Encounter Medications as of 06/11/2023  Medication Sig   acetaminophen  (TYLENOL ) 500 MG tablet Take 500 mg by mouth every 8 (eight) hours as needed for moderate pain.   hydrOXYzine  (ATARAX ) 10 MG tablet Take 1 tablet (10 mg total) by mouth 3 (three) times daily as needed.   Semaglutide ,0.25 or 0.5MG /DOS, (OZEMPIC , 0.25 OR 0.5 MG/DOSE,) 2 MG/1.5ML SOPN Inject 0.25 mg subq qwk for 4 wk then inject 0.5 mg subq qwk x4 wk, then go to full dose pen qwk   Iron  Polysacch Cmplx-B12-FA 150-0.025-1 MG CAPS Take on pill po daily (Patient not taking: Reported on 06/11/2023)   Prenatal Vit-Fe Fumarate-FA (PRENATAL MULTIVITAMIN) TABS tablet Take 1 tablet by mouth daily. (Patient not taking: Reported on 06/07/2021)   valACYclovir  (VALTREX ) 500 MG tablet Take 1 tablet (500 mg  total) by mouth 2 (two) times daily. (Patient not taking: Reported on 06/11/2023)   [DISCONTINUED] metroNIDAZOLE  (FLAGYL ) 500 MG tablet Take two tablets by mouth twice a day, for one day.  Or you can take all four tablets at once if you can tolerate it. (Patient not taking: Reported on 06/11/2023)   No facility-administered encounter medications on file as of 06/11/2023.    Past Medical History:  Diagnosis Date   Anxiety    Back pain    Chest pain    Constipation    Edema, lower extremity    GERD (gastroesophageal reflux disease)    High cholesterol    History of PCOS    Hypertension    Infertility, female    Pre-diabetes    Prediabetes    Shortness of breath     Past Surgical History:  Procedure Laterality Date   CERVICAL CERCLAGE N/A 11/15/2020   Procedure: CERCLAGE CERVICAL;  Surgeon: Heron Lord, MD;  Location: [facility] ORS;  Service: Gynecology;  Laterality: N/A;   CESAREAN SECTION  04/25/2021   Procedure: CESAREAN SECTION;  Surgeon: Alben Alma, MD;  Location: [facility] ORS;  Service: Obstetrics;;   DILATION AND CURETTAGE OF UTERUS      Family History  Problem Relation Age of Onset   Hypertension Mother    Obesity Mother    Diabetes Brother    Hypertension Father  Hyperlipidemia Father    Heart disease Father    Hypertension Maternal Grandmother    Hypercholesterolemia Maternal Grandmother    Breast cancer Maternal Grandmother    Breast cancer Maternal Aunt    Breast cancer Maternal Aunt     Social History   Socioeconomic History   Marital status: Single    Spouse name: Not on file   Number of children: 1   Years of education: Not on file   Highest education level: Associate degree: occupational, Scientist, product/process development, or vocational program  Occupational History   Occupation: homemaker  Tobacco Use   Smoking status: Never    Passive exposure: Never   Smokeless tobacco: Never  Vaping Use   Vaping status: Never Used  Substance and Sexual Activity   Alcohol use:  Not Currently    Comment: occassionally, but not whilepreg   Drug use: Never   Sexual activity: Not Currently    Partners: Male    Birth control/protection: None    Comment: BTL  Other Topics Concern   Not on file  Social History Narrative   42yo daugher - Robert Chimes    Social Drivers of Health   Financial Resource Strain: Low Risk  (12/31/2017)   Overall Financial Resource Strain (CARDIA)    Difficulty of Paying Living Expenses: Not hard at all  Food Insecurity: No Food Insecurity (12/31/2017)   Hunger Vital Sign    Worried About Running Out of Food in the Last Year: Never true    Ran Out of Food in the Last Year: Never true  Transportation Needs: No Transportation Needs (12/31/2017)   PRAPARE - Administrator, Civil Service (Medical): No    Lack of Transportation (Non-Medical): No  Physical Activity: Inactive (12/31/2017)   Exercise Vital Sign    Days of Exercise per Week: 0 days    Minutes of Exercise per Session: 0 min  Stress: No Stress Concern Present (12/31/2017)   [employer] of Occupational Health - Occupational Stress Questionnaire    Feeling of Stress : Not at all  Social Connections: Somewhat Isolated (12/31/2017)   Social Connection and Isolation Panel [NHANES]    Frequency of Communication with Friends and Family: More than three times a week    Frequency of Social Gatherings with Friends and Family: More than three times a week    Attends Religious Services: 1 to 4 times per year    Active Member of [employer] or Organizations: No    Attends Banker Meetings: Never    Marital Status: Never married  Intimate Partner Violence: Not At Risk (12/31/2017)   Humiliation, Afraid, Rape, and Kick questionnaire    Fear of Current or Ex-Partner: No    Emotionally Abused: No    Physically Abused: No    Sexually Abused: No    ROS      Objective   BP 129/84 (BP Location: Left Arm, Patient Position: Sitting, Cuff Size: Large)   Pulse 83   Temp  98.5 F (36.9 C) (Oral)   Resp 18   Ht 5\' 2"  (1.575 m)   Wt 267 lb (121.1 kg)   LMP 05/31/2023 (Exact Date)   SpO2 97%   BMI 48.83 kg/m    Physical Exam Vitals and nursing note reviewed.  Constitutional:      Appearance: Normal appearance.  HENT:     Head: Normocephalic and atraumatic.  Eyes:     Conjunctiva/sclera: Conjunctivae normal.  Cardiovascular:     Rate and Rhythm: Normal rate  and regular rhythm.  Pulmonary:     Effort: Pulmonary effort is normal.     Breath sounds: Normal breath sounds.  Musculoskeletal:     Right lower leg: No edema.     Left lower leg: No edema.  Skin:    General: Skin is warm and dry.  Neurological:     Mental Status: She is alert and oriented to person, place, and time.  Psychiatric:        Mood and Affect: Mood normal.        Behavior: Behavior normal.        Thought Content: Thought content normal.        Judgment: Judgment normal.            The ASCVD Risk score (Arnett DK, et al., 2019) failed to calculate for the following reasons:   Cannot find a previous HDL lab   Cannot find a previous total cholesterol lab     Assessment & Plan:  Anxiety and depression -     hydrOXYzine  HCl; Take 1 tablet (10 mg total) by mouth 3 (three) times daily as needed.  Dispense: 30 tablet; Refill: 0  Encounter for screening mammogram for malignant neoplasm of breast -     3D Screening Mammogram, Left and Right; Future  Borderline diabetes Assessment & Plan: A1c is 6.4%.  Discussed diabetes inn terms of changing diet and daily exercise.  Her insurance may allow her to have a GLP-1 with this reading.    Orders: -     Ozempic  (0.25 or 0.5 MG/DOSE); Inject 0.25 mg subq qwk for 4 wk then inject 0.5 mg subq qwk x4 wk, then go to full dose pen qwk  Dispense: 3 mL; Refill: 1 -     POCT glycosylated hemoglobin (Hb A1C)  Anxiety Assessment & Plan: Trial of hydroxyzine  10mg  for episodic anxiety.       Return in about 4 weeks (around  07/09/2023).   Gearold Wainer K Aashi Derrington, MD

## 2023-06-12 ENCOUNTER — Telehealth: Payer: Self-pay

## 2023-06-12 NOTE — Telephone Encounter (Signed)
(  Key: [id number])  Rx #: [id number]  Ozempic  (0.25 or 0.5 MG/DOSE) 2MG /3ML pen-injectors  Form  [employer] [place]  Electronic Prior Authorization Request Form [phone] NCPDP)

## 2023-06-15 ENCOUNTER — Other Ambulatory Visit: Payer: Self-pay | Admitting: Family Medicine

## 2023-06-15 ENCOUNTER — Encounter: Payer: Self-pay | Admitting: Family Medicine

## 2023-06-15 DIAGNOSIS — Z1231 Encounter for screening mammogram for malignant neoplasm of breast: Secondary | ICD-10-CM

## 2023-06-15 MED ORDER — WEGOVY 0.25 MG/0.5ML ~~LOC~~ SOAJ: 0.2500 mg | SUBCUTANEOUS | 0 refills | Status: DC

## 2023-06-16 ENCOUNTER — Telehealth: Payer: Self-pay

## 2023-06-16 NOTE — Telephone Encounter (Signed)
 Key: [facility])  Rx #: [id number]  Wegovy  0.25MG /0.5ML auto-injectors  Form  [employer] [place]  Electronic Prior Authorization Request Form [phone] NCPDP)

## 2023-06-19 ENCOUNTER — Ambulatory Visit
Admission: RE | Admit: 2023-06-19 | Discharge: 2023-06-19 | Disposition: A | Discharge status: Home / Self Care | Source: Ambulatory Visit

## 2023-06-19 DIAGNOSIS — Z1231 Encounter for screening mammogram for malignant neoplasm of breast: Secondary | ICD-10-CM

## 2023-06-22 DIAGNOSIS — Z419 Encounter for procedure for purposes other than remedying health state, unspecified: Secondary | ICD-10-CM | POA: Diagnosis not present

## 2023-07-09 ENCOUNTER — Ambulatory Visit: Admitting: Family Medicine

## 2023-07-10 ENCOUNTER — Ambulatory Visit (INDEPENDENT_AMBULATORY_CARE_PROVIDER_SITE_OTHER): Admitting: Family Medicine

## 2023-07-10 ENCOUNTER — Encounter: Payer: Self-pay | Admitting: Family Medicine

## 2023-07-10 DIAGNOSIS — F32A Depression, unspecified: Secondary | ICD-10-CM

## 2023-07-10 DIAGNOSIS — F419 Anxiety disorder, unspecified: Secondary | ICD-10-CM

## 2023-07-10 MED ORDER — HYDROXYZINE HCL 10 MG PO TABS: 10.0000 mg | ORAL_TABLET | Freq: Three times a day (TID) | ORAL | 3 refills | Status: DC | PRN

## 2023-07-10 MED ORDER — WEGOVY 0.5 MG/0.5ML ~~LOC~~ SOAJ
0.5000 mg | SUBCUTANEOUS | 0 refills | Status: DC
Start: 2023-07-10 — End: 2023-08-07

## 2023-07-10 NOTE — Progress Notes (Signed)
 Established Patient Office Visit  Subjective   Patient ID: Sarah Burke, female    DOB: [birth year]  Age: 43 y.o. MRN: [id number]  Chief Complaint  Patient presents with   Anxiety   Depression    Anxiety    Depression        Past medical history includes anxiety.   Discussed the use of AI scribe software for clinical note transcription with the patient, who gave verbal consent to proceed.  History of Present Illness   Sarah Burke is a 43 year old female who presents for follow-up on anxiety and weight management.  She has been taking hydroxyzine  10 mg at night for acute anxiety, which she finds helpful, especially for sleep when her mind is active. It also alleviates her allergy symptoms.  She was prescribed Wegovy  for weight management after insurance denied coverage for Ozempic . She completed the initial 0.25 mg dose without side effects. She manages constipation with dietary fiber from foods like oatmeal and cereal. She is mindful of her diet, focusing on low carbohydrates and high protein, and is increasing her physical activity by walking more. She monitors her sugar intake, opting for sugar-free alternatives and limiting soda consumption to one zero-calorie soda per day.  She recently had a mammogram, which showed no abnormalities.          Review of Systems  Psychiatric/Behavioral:  Positive for depression.       Objective:     BP 131/87 (BP Location: Left Arm, Patient Position: Sitting, Cuff Size: Normal)   Pulse 72   Temp 98.3 F (36.8 C) (Oral)   Resp 18   Ht 5\' 2"  (1.575 m)   Wt 256 lb (116.1 kg)   LMP 05/31/2023 (Exact Date)   SpO2 98%   BMI 46.82 kg/m    Physical Exam Vitals and nursing note reviewed.  Constitutional:      Appearance: Normal appearance.  HENT:     Head: Normocephalic and atraumatic.  Eyes:     Conjunctiva/sclera: Conjunctivae normal.  Cardiovascular:     Rate and Rhythm: Normal rate and regular rhythm.  Pulmonary:      Effort: Pulmonary effort is normal.     Breath sounds: Normal breath sounds.  Musculoskeletal:     Right lower leg: No edema.     Left lower leg: No edema.  Skin:    General: Skin is warm and dry.  Neurological:     Mental Status: She is alert and oriented to person, place, and time.  Psychiatric:        Mood and Affect: Mood normal.        Behavior: Behavior normal.        Thought Content: Thought content normal.        Judgment: Judgment normal.          No results found for any visits on 07/10/23.    The ASCVD Risk score (Arnett DK, et al., 2019) failed to calculate for the following reasons:   Cannot find a previous HDL lab   Cannot find a previous total cholesterol lab    Assessment & Plan:  Morbid obesity (HCC) -     Wegovy ; Inject 0.5 mg into the skin once a week. Use this dose for 1 month (4 shots) and then increase to next higher dose.  Dispense: 2 mL; Refill: 0  Anxiety and depression -     hydrOXYzine  HCl; Take 1 tablet (10 mg total) by mouth 3 (three) times  daily as needed.  Dispense: 90 tablet; Refill: 3  Assessment and Plan    Obesity Initiated Wegovy  (semaglutide ) after insurance approval. Completed 0.25 mg dose without side effects. Discussed mechanism of action and potential side effects. Emphasized lifestyle modifications. - Increase Wegovy  to 0.5 mg weekly. - Adopt low carbohydrate, high protein diet. - Engage in daily exercise. - Monitor for side effects such as nausea and constipation. - Rotate injection sites to prevent soreness.  Acute anxiety Managed with hydroxyzine  10 mg at night, improving symptoms and sleep. Also aids allergies. - Continue hydroxyzine  10 mg at night for anxiety and allergies.         Return in about 4 weeks (around 08/07/2023).    Sarah Burke K Sarah Melcher, MD

## 2023-07-23 DIAGNOSIS — Z419 Encounter for procedure for purposes other than remedying health state, unspecified: Secondary | ICD-10-CM | POA: Diagnosis not present

## 2023-08-07 ENCOUNTER — Ambulatory Visit (INDEPENDENT_AMBULATORY_CARE_PROVIDER_SITE_OTHER): Admitting: Family Medicine

## 2023-08-07 ENCOUNTER — Encounter: Payer: Self-pay | Admitting: Family Medicine

## 2023-08-07 VITALS — BP 123/84 | HR 81 | Temp 98.3°F | Resp 18 | Ht 62.0 in | Wt 251.0 lb

## 2023-08-07 DIAGNOSIS — R110 Nausea: Secondary | ICD-10-CM | POA: Diagnosis not present

## 2023-08-07 MED ORDER — WEGOVY 1 MG/0.5ML ~~LOC~~ SOAJ: 1.0000 mg | SUBCUTANEOUS | 0 refills | Status: DC

## 2023-08-07 MED ORDER — ONDANSETRON HCL 4 MG PO TABS: 4.0000 mg | ORAL_TABLET | Freq: Three times a day (TID) | ORAL | 0 refills | Status: DC | PRN

## 2023-08-07 NOTE — Progress Notes (Unsigned)
 Established Patient Office Visit  Subjective   Patient ID: Sarah Burke, female    DOB: May 25, 1980  Age: 43 y.o. MRN: [id number]  Chief Complaint  Patient presents with   Medical Management of Chronic Issues    HPI Discussed the use of AI scribe software for clinical note transcription with the patient, who gave verbal consent to proceed.  History of Present Illness   Sarah Burke is a 43 year old female who presents for weight management and follow-up on Wegovy  treatment.  She has experienced a total weight loss of sixteen pounds, with a five-pound reduction since her last visit. Her appetite tends to increase the day before her Wegovy  injection. She is currently on a 0.5 mg dose of Wegovy .  She adheres to a sugar-free diet and has increased her protein intake. She is considering further reducing her carbohydrate intake, particularly avoiding rice. She engages in daily walking for exercise but avoids outdoor activity when it is excessively hot.  She experiences occasional constipation, which is intermittent and manageable. She continues to take hydroxyzine  at bedtime, which assists with her allergies.  Her headaches have improved compared to the previous month. She manages them by resting and occasionally taking Tylenol  or ibuprofen . She is uncertain if the headaches are related to hunger or medication effects.              {History (Optional):23778}  ROS    Objective:     BP 123/84 (BP Location: Left Arm, Patient Position: Sitting, Cuff Size: Large)   Pulse 81   Temp 98.3 F (36.8 C) (Oral)   Resp 18   Ht 5' 2 (1.575 m)   Wt 251 lb (113.9 kg)   SpO2 98%   BMI 45.91 kg/m  {Vitals History (Optional):23777}  Physical Exam Vitals reviewed.  Constitutional:      Appearance: Normal appearance.  HENT:     Head: Normocephalic.   Eyes:     General:        Right eye: No discharge.        Left eye: No discharge.    Cardiovascular:     Rate and Rhythm:  Normal rate.  Pulmonary:     Effort: Pulmonary effort is normal.   Neurological:     Mental Status: She is alert and oriented to person, place, and time.   Psychiatric:        Mood and Affect: Mood normal.        Behavior: Behavior normal.        Thought Content: Thought content normal.        Judgment: Judgment normal.      {Perform Simple Foot Exam  Perform Detailed exam:1} {Insert foot Exam (Optional):30965}   No results found for any visits on 08/07/23.  {Labs (Optional):23779}  The ASCVD Risk score (Arnett DK, et al., 2019) failed to calculate for the following reasons:   Cannot find a previous HDL lab   Cannot find a previous total cholesterol lab    Assessment & Plan:  Nausea -     Ondansetron  HCl; Take 1 tablet (4 mg total) by mouth every 8 (eight) hours as needed for nausea or vomiting.  Dispense: 20 tablet; Refill: 0  Morbid obesity (HCC) -     Wegovy ; Inject 1 mg into the skin once a week. Use this dose for 1 month (4 shots) and then increase to next higher dose.  Dispense: 2 mL; Refill: 0     Return in about  4 weeks (around 09/04/2023).    Sheika Coutts K Terin Cragle, MD

## 2023-08-22 DIAGNOSIS — Z419 Encounter for procedure for purposes other than remedying health state, unspecified: Secondary | ICD-10-CM | POA: Diagnosis not present

## 2023-09-07 ENCOUNTER — Encounter: Payer: Self-pay | Admitting: Family Medicine

## 2023-09-07 ENCOUNTER — Ambulatory Visit (INDEPENDENT_AMBULATORY_CARE_PROVIDER_SITE_OTHER): Admitting: Family Medicine

## 2023-09-07 VITALS — BP 119/86 | HR 93 | Temp 98.6°F | Resp 18 | Ht 62.0 in | Wt 240.0 lb

## 2023-09-07 DIAGNOSIS — R110 Nausea: Secondary | ICD-10-CM | POA: Diagnosis not present

## 2023-09-07 DIAGNOSIS — Z6841 Body Mass Index (BMI) 40.0 and over, adult: Secondary | ICD-10-CM | POA: Diagnosis not present

## 2023-09-07 DIAGNOSIS — E66813 Obesity, class 3: Secondary | ICD-10-CM | POA: Diagnosis not present

## 2023-09-07 MED ORDER — WEGOVY 1 MG/0.5ML ~~LOC~~ SOAJ: 1.0000 mg | SUBCUTANEOUS | 1 refills | Status: DC

## 2023-09-07 MED ORDER — ONDANSETRON HCL 4 MG PO TABS
4.0000 mg | ORAL_TABLET | Freq: Three times a day (TID) | ORAL | 1 refills | Status: DC | PRN
Start: 2023-09-07 — End: 2023-10-15

## 2023-09-07 NOTE — Assessment & Plan Note (Signed)
 She has lost a total of 27 pounds and 11 pounds in the last month.  She is on Wegovy  1 mg weekly.  Lets continue with Wegovy  1 mg weekly for the next 4 weeks and then reassess.

## 2023-09-07 NOTE — Progress Notes (Signed)
 Established Patient Office Visit  Subjective   Patient ID: Sarah Burke, female    DOB: June 06, 1980  Age: 43 y.o. MRN: [id number]  Chief Complaint  Patient presents with   Medical Management of Chronic Issues    HPI delightful 43 year old woman with anxiety and obesity.    She is on Wegovy  1 mg daily and reports that she feels good and her appetite is decreased.  She is trying to eat high-protein foods with each meal she has noticed that she has nausea and she takes ondansetron  1 or 2 times a day for the first 2 to 3 days after she takes her Wegovy  injection.  She has been walking but has gotten too hot so now she is trying to go to the gym.  If she does not go to the gym she does hand weights in her house.  She has had no abdominal pain and denies constipation.  She has lost 11 pounds in the last month and a total of 27 pounds so far.  She feels good about her weight loss.     ROS    Objective:     BP 119/86 (BP Location: Left Arm, Patient Position: Sitting, Cuff Size: Large)   Pulse 93   Temp 98.6 F (37 C) (Oral)   Resp 18   Ht 5' 2 (1.575 m)   Wt 240 lb (108.9 kg)   SpO2 98%   BMI 43.90 kg/m    Physical Exam Vitals and nursing note reviewed.  Constitutional:      Appearance: Normal appearance.  HENT:     Head: Normocephalic and atraumatic.  Eyes:     Conjunctiva/sclera: Conjunctivae normal.  Cardiovascular:     Rate and Rhythm: Normal rate and regular rhythm.  Pulmonary:     Effort: Pulmonary effort is normal.     Breath sounds: Normal breath sounds.  Musculoskeletal:     Right lower leg: No edema.     Left lower leg: No edema.  Skin:    General: Skin is warm and dry.  Neurological:     Mental Status: She is alert and oriented to person, place, and time.  Psychiatric:        Mood and Affect: Mood normal.        Behavior: Behavior normal.        Thought Content: Thought content normal.        Judgment: Judgment normal.          No results  found for any visits on 09/07/23.    The ASCVD Risk score (Arnett DK, et al., 2019) failed to calculate for the following reasons:   Cannot find a previous HDL lab   Cannot find a previous total cholesterol lab    Assessment & Plan:  Class 3 severe obesity due to excess calories with serious comorbidity and body mass index (BMI) of 40.0 to 44.9 in adult Assessment & Plan: She has lost a total of 27 pounds and 11 pounds in the last month.  She is on Wegovy  1 mg weekly.  Lets continue with Wegovy  1 mg weekly for the next 4 weeks and then reassess.   Nausea -     Ondansetron  HCl; Take 1 tablet (4 mg total) by mouth every 8 (eight) hours as needed for nausea or vomiting.  Dispense: 20 tablet; Refill: 1  Morbid obesity (HCC) -     Wegovy ; Inject 1 mg into the skin once a week. Use this dose for 1  month (4 shots) and then increase to next higher dose.  Dispense: 2 mL; Refill: 1     Return in about 4 weeks (around 10/05/2023).    Todd Jelinski K Beau Ramsburg, MD

## 2023-09-22 DIAGNOSIS — Z419 Encounter for procedure for purposes other than remedying health state, unspecified: Secondary | ICD-10-CM | POA: Diagnosis not present

## 2023-09-24 ENCOUNTER — Encounter: Payer: Self-pay | Admitting: Family Medicine

## 2023-10-07 ENCOUNTER — Ambulatory Visit: Admitting: Family Medicine

## 2023-10-15 ENCOUNTER — Encounter: Payer: Self-pay | Admitting: Family Medicine

## 2023-10-15 ENCOUNTER — Ambulatory Visit (INDEPENDENT_AMBULATORY_CARE_PROVIDER_SITE_OTHER): Admitting: Family Medicine

## 2023-10-15 VITALS — BP 120/83 | HR 84 | Temp 98.3°F | Resp 18 | Ht 62.0 in | Wt 239.0 lb

## 2023-10-15 DIAGNOSIS — Z6841 Body Mass Index (BMI) 40.0 and over, adult: Secondary | ICD-10-CM

## 2023-10-15 DIAGNOSIS — R110 Nausea: Secondary | ICD-10-CM | POA: Diagnosis not present

## 2023-10-15 DIAGNOSIS — E66813 Obesity, class 3: Secondary | ICD-10-CM

## 2023-10-15 MED ORDER — WEGOVY 1.7 MG/0.75ML ~~LOC~~ SOAJ: 1.7000 mg | SUBCUTANEOUS | 0 refills | Status: DC

## 2023-10-15 MED ORDER — ONDANSETRON HCL 4 MG PO TABS
4.0000 mg | ORAL_TABLET | Freq: Three times a day (TID) | ORAL | 1 refills | Status: DC | PRN
Start: 2023-10-15 — End: 2023-11-30

## 2023-10-15 NOTE — Progress Notes (Signed)
 Established Patient Office Visit  Subjective   Patient ID: Sarah Burke, female    DOB: [birth year]  Age: 43 y.o. MRN: [id number]  Chief Complaint  Patient presents with   Medical Management of Chronic Issues    HPI Discussed the use of AI scribe software for clinical note transcription with the patient, who gave verbal consent to proceed.  History of Present Illness   Sarah Burke is a 43 year old female who presents for weight management follow-up.  She has experienced a weight loss of one pound over the past month, which she attributes to being behind on her Wegovy  dosage due to a week without medication. She took her last dose yesterday.  She mentions a lapse in her dietary habits, noting that she has not been meal prepping as before and has been eating more varied foods, including meals prepared by others. She has not been consuming fast food but has deviated from her previous diet of vegetables and protein. She has been keeping a food journal but admits to not being as diligent with meal preparation.  She describes feeling 'blocked' and not feeling well overall. Her nausea has been manageable, and she has been trying to ration her medication to avoid taking it constantly. She notes that she does not experience cravings as she did before starting the injections.       Objective:     BP 120/83 (BP Location: Left Arm, Patient Position: Sitting, Cuff Size: Large)   Pulse 84   Temp 98.3 F (36.8 C) (Oral)   Resp 18   Ht 5' 2 (1.575 m)   Wt 239 lb (108.4 kg)   SpO2 98%   BMI 43.71 kg/m    Physical Exam Vitals and nursing note reviewed.  Constitutional:      Appearance: Normal appearance.  HENT:     Head: Normocephalic and atraumatic.  Eyes:     Conjunctiva/sclera: Conjunctivae normal.  Cardiovascular:     Rate and Rhythm: Normal rate and regular rhythm.  Pulmonary:     Effort: Pulmonary effort is normal.     Breath sounds: Normal breath sounds.   Musculoskeletal:     Right lower leg: No edema.     Left lower leg: No edema.  Skin:    General: Skin is warm and dry.  Neurological:     Mental Status: She is alert and oriented to person, place, and time.  Psychiatric:        Mood and Affect: Mood normal.        Behavior: Behavior normal.        Thought Content: Thought content normal.        Judgment: Judgment normal.          No results found for any visits on 10/15/23.    The ASCVD Risk score (Arnett DK, et al., 2019) failed to calculate for the following reasons:   Cannot find a previous HDL lab   Cannot find a previous total cholesterol lab    Assessment & Plan:  Class 3 severe obesity due to excess calories with serious comorbidity and body mass index (BMI) of 40.0 to 44.9 in adult Assessment & Plan: Increase Wegovy  to 1.7mg  weekly.  FOLLOW-UP in a month.  Continue routine exercise and meal prep.    Orders: -     Wegovy ; Inject 1.7 mg into the skin once a week. Use this dose for 1 month (4 shots) and then increase to next higher dose.  Dispense: 3 mL; Refill: 0  Nausea -     Ondansetron  HCl; Take 1 tablet (4 mg total) by mouth every 8 (eight) hours as needed for nausea or vomiting.  Dispense: 20 tablet; Refill: 1     Return in about 4 weeks (around 11/12/2023).    Debhora Titus K Kenidy Crossland, MD

## 2023-10-15 NOTE — Assessment & Plan Note (Signed)
 Increase Wegovy  to 1.7mg  weekly.  FOLLOW-UP in a month.  Continue routine exercise and meal prep.

## 2023-10-23 DIAGNOSIS — Z419 Encounter for procedure for purposes other than remedying health state, unspecified: Secondary | ICD-10-CM | POA: Diagnosis not present

## 2023-10-27 DIAGNOSIS — N760 Acute vaginitis: Secondary | ICD-10-CM | POA: Diagnosis not present

## 2023-10-27 DIAGNOSIS — R300 Dysuria: Secondary | ICD-10-CM | POA: Diagnosis not present

## 2023-11-01 ENCOUNTER — Encounter: Payer: Self-pay | Admitting: Family Medicine

## 2023-11-02 ENCOUNTER — Other Ambulatory Visit: Payer: Self-pay | Admitting: Family Medicine

## 2023-11-02 ENCOUNTER — Telehealth: Payer: Self-pay

## 2023-11-02 DIAGNOSIS — E66813 Obesity, class 3: Secondary | ICD-10-CM

## 2023-11-02 MED ORDER — WEGOVY 2.4 MG/0.75ML ~~LOC~~ SOAJ: 2.4000 mg | SUBCUTANEOUS | 11 refills | Status: DC

## 2023-11-02 NOTE — Telephone Encounter (Signed)
(  [facility]) Rx #: [id number] Wegovy  2.4MG /0.75ML auto-injectors Form [employer] [place]  Electronic Prior Authorization Request Form [phone] NCPDP)

## 2023-11-03 NOTE — Telephone Encounter (Signed)
 Your prior authorization for Wegovy  has been approved!  Message from plan: Approved. This drug has been approved.  Approved quantity: 3 milliliters per 28 day(s).   You may fill up to a 34 day supply at a retail pharmacy. You may fill up to a 90 day supply for maintenance drugs, please refer to the formulary for details.   Authorization Expiration Date: November 01, 2024.

## 2023-11-26 ENCOUNTER — Ambulatory Visit: Admitting: Family Medicine

## 2023-11-30 ENCOUNTER — Telehealth: Payer: Self-pay

## 2023-11-30 ENCOUNTER — Encounter: Payer: Self-pay | Admitting: Family Medicine

## 2023-11-30 ENCOUNTER — Ambulatory Visit (INDEPENDENT_AMBULATORY_CARE_PROVIDER_SITE_OTHER): Admitting: Family Medicine

## 2023-11-30 VITALS — BP 139/85 | HR 74 | Temp 98.3°F | Resp 18 | Ht 62.0 in | Wt 232.0 lb

## 2023-11-30 DIAGNOSIS — E66813 Obesity, class 3: Secondary | ICD-10-CM | POA: Diagnosis not present

## 2023-11-30 DIAGNOSIS — Z6841 Body Mass Index (BMI) 40.0 and over, adult: Secondary | ICD-10-CM

## 2023-11-30 DIAGNOSIS — Z23 Encounter for immunization: Secondary | ICD-10-CM

## 2023-11-30 DIAGNOSIS — F419 Anxiety disorder, unspecified: Secondary | ICD-10-CM

## 2023-11-30 DIAGNOSIS — R110 Nausea: Secondary | ICD-10-CM

## 2023-11-30 MED ORDER — ONDANSETRON HCL 4 MG PO TABS: 4.0000 mg | ORAL_TABLET | Freq: Three times a day (TID) | ORAL | 1 refills | Status: DC | PRN

## 2023-11-30 MED ORDER — HYDROXYZINE HCL 10 MG PO TABS: 10.0000 mg | ORAL_TABLET | Freq: Three times a day (TID) | ORAL | 0 refills | Status: DC | PRN

## 2023-11-30 MED ORDER — WEGOVY 2.4 MG/0.75ML ~~LOC~~ SOAJ: 2.4000 mg | SUBCUTANEOUS | 11 refills | Status: DC

## 2023-11-30 NOTE — Telephone Encounter (Signed)
 Message from Plan  Denied. WEGOVY  Soln Auto-inj is an excluded product/service. The health plan does not cover this drug class for weight management.

## 2023-11-30 NOTE — Progress Notes (Unsigned)
 Established Patient Office Visit  Subjective   Patient ID: Sarah Burke, female    DOB: [birth year]  Age: 43 y.o. MRN: [id number]  Chief Complaint  Patient presents with   Weight Management Screening    HPI Discussed the use of AI scribe software for clinical note transcription with the patient, who gave verbal consent to proceed.  History of Present Illness   Sarah Burke is a 43 year old female who presents with stress and anxiety exacerbated by work and personal life challenges.  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 The stress has been exacerbated by the anniversary of her son's passing, leading to emotional challenges and her absence from work since last Monday.  She uses Adderall 10 mg, taking about two pills a day, which she finds effective. She also uses Atarax  for anxiety and is concerned about her Medicaid benefits being canceled, which she needs for her health issues.  She reports a lack of appetite and has not been eating properly, though she has not gained or lost weight. She acknowledges that not eating regularly is problematic and is trying to make herself eat despite the lack of appetite.  She experiences nausea and has only two Zofran  pills left, indicating a need for a refill.  She expresses a need for counseling, especially in light of the emotional challenges she is facing, including the recent anniversary of her son's death and the stress from her job and family responsibilities.       Objective:     BP 139/85 (BP Location: Right Arm, Patient Position: Sitting, Cuff Size: Normal)   Pulse 74   Temp 98.3 F (36.8 C) (Oral)   Resp 18   Ht 5' 2 (1.575 m)   Wt 232 lb (105.2 kg)   SpO2 100%   BMI 42.43 kg/m    Physical Exam Vitals and nursing note reviewed.  Constitutional:      Appearance: Normal appearance.  HENT:     Head: Normocephalic and  atraumatic.  Eyes:     Conjunctiva/sclera: Conjunctivae normal.  Cardiovascular:     Rate and Rhythm: Normal rate and regular rhythm.  Pulmonary:     Effort: Pulmonary effort is normal.     Breath sounds: Normal breath sounds.  Musculoskeletal:     Right lower leg: No edema.     Left lower leg: No edema.  Skin:    General: Skin is warm and dry.  Neurological:     Mental Status: She is alert and oriented to person, place, and time.  Psychiatric:        Mood and Affect: Mood normal.        Behavior: Behavior normal.        Thought Content: Thought content normal.        Judgment: Judgment normal.          No results found for any visits on 11/30/23.    The 10-year ASCVD risk score (Arnett DK, et al., 2019) is: 1.2%    Assessment & Plan:  Class 3 severe obesity due to excess calories with serious comorbidity and body mass index (BMI) of 40.0 to 44.9 in adult [facility]) Assessment & Plan: She reports no weight gain or loss in the last month.  She has not experiencing stress eating because of the stress she is under right now.   Increase Wegovy  to 2.4 mg weekly.  Encouraged  regular eating habits and exercise  Orders: -     Wegovy ; Inject 2.4 mg into the skin once a week.  Dispense: 3 mL; Refill: 11  Anxiety Assessment & Plan: Anxiety is exacerbated by recent stressors including the anniversary of her son's passing and work-related stress.  She reports feeling overwhelmed and in need of counseling.  Use hydroxyzine  10 mg 3 times a day as needed.  Recommend [facility] for online therapy.  Also check the mental health services on your insurance card for additional options.  Orders: -     hydrOXYzine  HCl; Take 1 tablet (10 mg total) by mouth 3 (three) times daily as needed.  Dispense: 180 tablet; Refill: 0  Nausea -     Ondansetron  HCl; Take 1 tablet (4 mg total) by mouth every 8 (eight) hours as needed for nausea or vomiting.  Dispense: 20 tablet; Refill: 1  Immunization  due Assessment & Plan: She declined the flu vaccine today.          Return in about 4 weeks (around 12/28/2023) for physical exam.    Denman Pichardo K Detrell Umscheid, MD

## 2023-11-30 NOTE — Telephone Encounter (Signed)
(  KeyBETHA CROUCH) Rx #: [id number] Wegovy  2.4MG /0.75ML auto-injectors Form [employer] [place]  Electronic Prior Authorization Request Form [phone] [place])

## 2023-12-02 ENCOUNTER — Encounter: Payer: Self-pay | Admitting: Family Medicine

## 2023-12-02 ENCOUNTER — Ambulatory Visit (INDEPENDENT_AMBULATORY_CARE_PROVIDER_SITE_OTHER): Admitting: Family Medicine

## 2023-12-02 VITALS — BP 134/79 | HR 77 | Temp 98.1°F | Resp 18 | Ht 62.0 in | Wt 232.0 lb

## 2023-12-02 DIAGNOSIS — R7303 Prediabetes: Secondary | ICD-10-CM

## 2023-12-02 DIAGNOSIS — N920 Excessive and frequent menstruation with regular cycle: Secondary | ICD-10-CM | POA: Diagnosis not present

## 2023-12-02 DIAGNOSIS — Z0000 Encounter for general adult medical examination without abnormal findings: Secondary | ICD-10-CM

## 2023-12-02 DIAGNOSIS — F419 Anxiety disorder, unspecified: Secondary | ICD-10-CM | POA: Diagnosis not present

## 2023-12-02 NOTE — Progress Notes (Signed)
 Complete physical exam  Patient: Sarah Burke   DOB: 1980-09-25   43 y.o. Female  MRN: [id number]  Subjective:    Chief Complaint  Patient presents with   Annual Exam    Sarah Burke is a 43 y.o. female who presents today for a complete physical exam. She reports consuming a general diet.   She generally feels overwhelmed. She reports sleeping fairly poorly.  She does have additional problems to discuss today.   Discussed the use of AI scribe software for clinical note transcription with the patient, who gave verbal consent to proceed.  History of Present Illness   Sarah Burke is a 43 year old female who presents for an annual physical exam.  She experiences anxiety and depression, which are exacerbated by her work environment. 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  She has taken time off the [facility] position and is on medication for anxiety, hydroxyzine , which she recently refilled and finds effective. She typically takes one or two a day.  She has sought mental health support through [facility], an on-line psychology site.  Her Medicaid benefits are in question and she is trying to ensure continued coverage. She refers to her [employer] position as  stressful and toxic environment contributing to her anxiety. She has taken mental health days to cope with the stress.  She may be getting an FMLA.  Bring this form to the office and we will get it completed.    Her menstrual periods occur monthly, though she believes she may have had two periods last month. Menstrual bleeding has eased somewhat since undergoing two dilation and curettage procedures, but she still experiences some pain and cramping. During the review of systems, she reports poor sleep quality. She has had a mammogram in the past, which was normal.  Last pap was 10/16/2020, NILM and transition zone present.    Her family history is significant for breast cancer, affecting her mother, grandmother, and two aunts, one of  whom is deceased. She has not undergone DNA testing for breast cancer risk and does not wish to pursue it at this time.       Most recent fall risk assessment:    09/07/2023    8:53 AM  Fall Risk   Falls in the past year? 0  Number falls in past yr: 0  Injury with Fall? 0  Risk for fall due to : No Fall Risks  Follow up Falls evaluation completed     Most recent depression screenings:    09/07/2023    8:53 AM 08/07/2023    9:27 AM  PHQ 2/9 Scores  PHQ - 2 Score 0 0  PHQ- 9 Score 2 1        Patient Care Team: Abilene Mcphee K, MD as PCP - General (Family Medicine)   Outpatient Medications Prior to Visit  Medication Sig   acetaminophen  (TYLENOL ) 500 MG tablet Take 500 mg by mouth every 8 (eight) hours as needed for moderate pain.   hydrOXYzine  (ATARAX ) 10 MG tablet Take 1 tablet (10 mg total) by mouth 3 (three) times daily as needed.   Iron  Polysacch Cmplx-B12-FA 150-0.025-1 MG CAPS Take on pill po daily   ondansetron  (ZOFRAN ) 4 MG tablet Take 1 tablet (4 mg total) by mouth every 8 (eight) hours as needed for nausea or vomiting.   valACYclovir  (VALTREX ) 500 MG tablet Take 1 tablet (500 mg total) by mouth 2 (two) times daily.  WEGOVY  2.4 MG/0.75ML SOAJ SQ injection Inject 2.4 mg into the skin once a week.   Prenatal Vit-Fe Fumarate-FA (PRENATAL MULTIVITAMIN) TABS tablet Take 1 tablet by mouth daily. (Patient not taking: Reported on 06/07/2021)   No facility-administered medications prior to visit.    ROS     Objective:    BP 134/79 (BP Location: Right Arm, Patient Position: Sitting, Cuff Size: Normal)   Pulse 77   Temp 98.1 F (36.7 C) (Oral)   Resp 18   Ht 5' 2 (1.575 m)   Wt 232 lb (105.2 kg)   SpO2 99%   BMI 42.43 kg/m    Physical Exam Vitals and nursing note reviewed. Exam conducted with a chaperone present.  Constitutional:      Appearance: Normal appearance.  HENT:     Head: Normocephalic and atraumatic.  Eyes:     Conjunctiva/sclera: Conjunctivae  normal.  Cardiovascular:     Rate and Rhythm: Normal rate and regular rhythm.  Pulmonary:     Effort: Pulmonary effort is normal.     Breath sounds: Normal breath sounds.  Chest:     Chest wall: No mass.  Breasts:    Right: Normal. No mass, nipple discharge or skin change.     Left: Normal. No mass, nipple discharge or skin change.  Musculoskeletal:     Right lower leg: No edema.     Left lower leg: No edema.  Lymphadenopathy:     Upper Body:     Right upper body: No supraclavicular, axillary or pectoral adenopathy.     Left upper body: No supraclavicular, axillary or pectoral adenopathy.  Skin:    General: Skin is warm and dry.  Neurological:     Mental Status: She is alert and oriented to person, place, and time.  Psychiatric:        Mood and Affect: Mood normal.        Behavior: Behavior normal.        Thought Content: Thought content normal.        Judgment: Judgment normal.      No results found for any visits on 12/02/23.   The ASCVD Risk score (Arnett DK, et al., 2019) failed to calculate for the following reasons:   Cannot find a previous HDL lab   Cannot find a previous total cholesterol lab     Assessment & Plan:    Routine Health Maintenance and Physical Exam  Health Maintenance  Topic Date Due   HPV Vaccine (1 - 3-dose SCDM series) Never done   Hepatitis B Vaccine (3 of 3 - 19+ 3-dose series) 09/05/2015   COVID-19 Vaccine (1 - 2025-26 season) Never done   Flu Shot  05/10/2024*   Breast Cancer Screening  06/18/2025   Pap with HPV screening  10/16/2025   DTaP/Tdap/Td vaccine (5 - Td or Tdap) 03/09/2031   Hepatitis C Screening  Completed   HIV Screening  Completed   Pneumococcal Vaccine  Aged Out   Meningitis B Vaccine  Aged Out  *Topic was postponed. The date shown is not the original due date.    Discussed health benefits of physical activity, and encouraged her to engage in regular exercise appropriate for her age and condition.  Prediabetes -      Lipid panel -     Comprehensive metabolic panel with GFR -     Hemoglobin A1c -     Microalbumin / creatinine urine ratio  Menorrhagia with regular cycle -  CBC with Differential/Platelet  Wellness examination Assessment & Plan: Appears healthy.  Pap due in 2 years.  Has a history of prediabetes.     Anxiety Assessment & Plan: Taking hydroxyzine  once or twice a day.  Likes this medication and feels it is adequate for her anxiety     Return in about 4 weeks (around 12/30/2023).     Kayman Snuffer K Arhum Peeples, MD

## 2023-12-02 NOTE — Assessment & Plan Note (Signed)
 Taking hydroxyzine  once or twice a day.  Likes this medication and feels it is adequate for her anxiety

## 2023-12-02 NOTE — Assessment & Plan Note (Signed)
 Appears healthy.  Pap due in 2 years.  Has a history of prediabetes.

## 2023-12-03 ENCOUNTER — Encounter: Payer: Self-pay | Admitting: Family Medicine

## 2023-12-03 DIAGNOSIS — Z23 Encounter for immunization: Secondary | ICD-10-CM | POA: Insufficient documentation

## 2023-12-03 LAB — COMPREHENSIVE METABOLIC PANEL WITH GFR
ALT: 10 IU/L (ref 0–32)
AST: 10 IU/L (ref 0–40)
Albumin: 4.5 g/dL (ref 3.9–4.9)
Alkaline Phosphatase: 75 IU/L (ref 41–116)
BUN/Creatinine Ratio: 8 — ABNORMAL LOW (ref 9–23)
BUN: 6 mg/dL (ref 6–24)
Bilirubin Total: 0.3 mg/dL (ref 0.0–1.2)
CO2: 22 mmol/L (ref 20–29)
Calcium: 9.6 mg/dL (ref 8.7–10.2)
Chloride: 105 mmol/L (ref 96–106)
Creatinine, Ser: 0.8 mg/dL (ref 0.57–1.00)
Globulin, Total: 2.8 g/dL (ref 1.5–4.5)
Glucose: 78 mg/dL (ref 70–99)
Potassium: 4.5 mmol/L (ref 3.5–5.2)
Sodium: 141 mmol/L (ref 134–144)
Total Protein: 7.3 g/dL (ref 6.0–8.5)
eGFR: 95 mL/min/1.73 (ref >59)

## 2023-12-03 LAB — CBC WITH DIFFERENTIAL/PLATELET
Basophils Absolute: 0 x10E3/uL (ref 0.0–0.2)
Basos: 0 %
EOS (ABSOLUTE): 0.1 x10E3/uL (ref 0.0–0.4)
Eos: 1 %
Hematocrit: 35.5 % (ref 34.0–46.6)
Hemoglobin: 11 g/dL — ABNORMAL LOW (ref 11.1–15.9)
Immature Grans (Abs): 0 x10E3/uL (ref 0.0–0.1)
Immature Granulocytes: 0 %
Lymphocytes Absolute: 1.5 x10E3/uL (ref 0.7–3.1)
Lymphs: 21 %
MCH: 26.8 pg (ref 26.6–33.0)
MCHC: 31 g/dL — ABNORMAL LOW (ref 31.5–35.7)
MCV: 87 fL (ref 79–97)
Monocytes Absolute: 0.6 x10E3/uL (ref 0.1–0.9)
Monocytes: 8 %
Neutrophils Absolute: 4.9 x10E3/uL (ref 1.4–7.0)
Neutrophils: 70 %
Platelets: 362 x10E3/uL (ref 150–450)
RBC: 4.1 x10E6/uL (ref 3.77–5.28)
RDW: 13.2 % (ref 11.7–15.4)
WBC: 7.1 x10E3/uL (ref 3.4–10.8)

## 2023-12-03 LAB — HEMOGLOBIN A1C
Est. average glucose Bld gHb Est-mCnc: 111 mg/dL
Hgb A1c MFr Bld: 5.5 % (ref 4.8–5.6)

## 2023-12-03 LAB — LIPID PANEL
Chol/HDL Ratio: 3.9 ratio (ref 0.0–4.4)
Cholesterol, Total: 175 mg/dL (ref 100–199)
HDL: 45 mg/dL (ref >39)
LDL Chol Calc (NIH): 116 mg/dL — ABNORMAL HIGH (ref 0–99)
Triglycerides: 74 mg/dL (ref 0–149)
VLDL Cholesterol Cal: 14 mg/dL (ref 5–40)

## 2023-12-03 LAB — MICROALBUMIN / CREATININE URINE RATIO
Creatinine, Urine: 335.1 mg/dL
Microalb/Creat Ratio: 4 mg/g{creat} (ref 0–29)
Microalbumin, Urine: 14.2 ug/mL

## 2023-12-03 NOTE — Assessment & Plan Note (Signed)
 She reports no weight gain or loss in the last month.  She has not experiencing stress eating because of the stress she is under right now.   Increase Wegovy  to 2.4 mg weekly.  Encouraged regular eating habits and exercise

## 2023-12-03 NOTE — Assessment & Plan Note (Signed)
 She declined the flu vaccine today.

## 2023-12-03 NOTE — Assessment & Plan Note (Signed)
 Anxiety is exacerbated by recent stressors including the anniversary of her son's passing and work-related stress.  She reports feeling overwhelmed and in need of counseling.  Use hydroxyzine  10 mg 3 times a day as needed.  Recommend [facility] for online therapy.  Also check the mental health services on your insurance card for additional options.

## 2023-12-07 ENCOUNTER — Ambulatory Visit: Payer: Self-pay | Admitting: Family Medicine

## 2023-12-28 ENCOUNTER — Encounter: Payer: Self-pay | Admitting: Family Medicine

## 2023-12-28 ENCOUNTER — Ambulatory Visit: Admitting: Family Medicine

## 2023-12-28 VITALS — BP 128/88 | HR 73 | Temp 98.2°F | Resp 18 | Ht 62.0 in | Wt 229.0 lb

## 2023-12-28 DIAGNOSIS — Z6841 Body Mass Index (BMI) 40.0 and over, adult: Secondary | ICD-10-CM | POA: Diagnosis not present

## 2023-12-28 DIAGNOSIS — F419 Anxiety disorder, unspecified: Secondary | ICD-10-CM

## 2023-12-28 DIAGNOSIS — E66813 Obesity, class 3: Secondary | ICD-10-CM

## 2023-12-28 MED ORDER — PHENTERMINE HCL 37.5 MG PO CAPS: ORAL_CAPSULE | ORAL | 0 refills | Status: DC

## 2023-12-28 NOTE — Progress Notes (Unsigned)
 Established Patient Office Visit  Subjective   Patient ID: Sarah Burke, female    DOB: 07/09/1980  Age: 43 y.o. MRN: [id number]  Chief Complaint  Patient presents with   Medical Management of Chronic Issues    HPI Discussed the use of AI scribe software for clinical note transcription with the patient, who gave verbal consent to proceed.  History of Present Illness   Sarah Burke is a 43 year old female who presents with anxiety and depression related to her work environment.  She reports experiencing anxiety and depression and describes her work environment at [employer] as toxic. She has been on medical accommodation, approved by HR, allowing her four episodes a month totaling one day per episode plus her doctor's appointments. However, her supervisor has not addressed these accommodations, leading her to decide to quit her job after the Thanksgiving holiday to receive holiday pay. She describes feeling 'cluttered' and has been decluttering her home to alleviate stress.  She takes hydroxyzine  10 mg as needed for anxiety, which she finds effective, particularly when working. Her job involves phone interactions with the public, which are often unpleasant, exacerbating her anxiety.  She is dealing with financial stress due to insufficient support from her son's father, who only contributed $500 towards bills in the past month without providing for other necessities like diapers and wipes. This has added to her stress.  Regarding weight management, she was previously on Wegovy , which is no longer available to her due to changes in the formulary and insurance coverage. She has not gained weight and is maintaining her fitness by working out with a friend and following a diet plan similar to what she used with Wegovy , focusing on protein intake and sugar-free drinks. She is awaiting a response from Medicaid regarding her health insurance status.       Objective:     BP 128/88 (BP Location:  Left Arm, Patient Position: Sitting, Cuff Size: Large)   Pulse 73   Temp 98.2 F (36.8 C) (Oral)   Resp 18   Ht 5' 2 (1.575 m)   Wt 229 lb (103.9 kg)   SpO2 100%   BMI 41.88 kg/m    Physical Exam Vitals and nursing note reviewed.  Constitutional:      Appearance: Normal appearance.  HENT:     Head: Normocephalic and atraumatic.  Eyes:     Conjunctiva/sclera: Conjunctivae normal.  Cardiovascular:     Rate and Rhythm: Normal rate and regular rhythm.  Pulmonary:     Effort: Pulmonary effort is normal.     Breath sounds: Normal breath sounds.  Musculoskeletal:     Right lower leg: No edema.     Left lower leg: No edema.  Skin:    General: Skin is warm and dry.  Neurological:     Mental Status: She is alert and oriented to person, place, and time.  Psychiatric:        Mood and Affect: Mood normal.        Behavior: Behavior normal.        Thought Content: Thought content normal.        Judgment: Judgment normal.          No results found for any visits on 12/28/23.    The 10-year ASCVD risk score (Arnett DK, et al., 2019) is: 1%    Assessment & Plan:  Class 3 severe obesity due to excess calories with serious comorbidity and body mass index (BMI) of 40.0  to 44.9 in adult [facility]) -     Phentermine HCl; One capsule by mouth qAM  Dispense: 30 capsule; Refill: 0  Morbid obesity (HCC) Assessment & Plan: She was using Wegovy  but is no longer able to do this as her insurance coverage has changed.  She is exercising routinely and working on diet modification including increased protein and reduction in sugar drinks.  Discussed phentermine as a 90-day supply of appetite suppression.  Follow-up in 1 month to check blood pressure and assess progress.   Anxiety Assessment & Plan: Anxiety and depression is managed with hydroxyzine  10 mg as needed which she finds effective.  She takes it primarily when working especially during interactions with the public.  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  Continue hydroxyzine  10 mg 3 times daily to 4 times daily as needed             Return in about 4 weeks (around 01/25/2024).    Neelam Tiggs K Stacee Earp, MD

## 2023-12-29 NOTE — Assessment & Plan Note (Signed)
 Anxiety and depression is managed with hydroxyzine  10 mg as needed which she finds effective.  She takes it primarily when working especially during interactions with the public.  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  Continue hydroxyzine  10 mg 3 times daily to 4 times daily as needed.

## 2023-12-29 NOTE — Assessment & Plan Note (Signed)
 She was using Wegovy  but is no longer able to do this as her insurance coverage has changed.  She is exercising routinely and working on diet modification including increased protein and reduction in sugar drinks.  Discussed phentermine as a 90-day supply of appetite suppression.  Follow-up in 1 month to check blood pressure and assess progress.

## 2024-01-27 ENCOUNTER — Ambulatory Visit: Admitting: Family Medicine

## 2024-02-24 ENCOUNTER — Ambulatory Visit: Payer: Self-pay

## 2024-02-24 ENCOUNTER — Ambulatory Visit: Payer: Self-pay | Admitting: Family Medicine

## 2024-02-24 DIAGNOSIS — R4586 Emotional lability: Secondary | ICD-10-CM

## 2024-02-24 DIAGNOSIS — Z113 Encounter for screening for infections with a predominantly sexual mode of transmission: Secondary | ICD-10-CM

## 2024-02-24 LAB — WET PREP FOR TRICH, YEAST, CLUE
Clue Cell Exam: NEGATIVE
Trichomonas Exam: NEGATIVE
Yeast Exam: NEGATIVE

## 2024-02-24 LAB — HM HIV SCREENING LAB: HM HIV Screening: NEGATIVE

## 2024-02-24 NOTE — Progress Notes (Signed)
 " [facility] Department STI clinic [address], [address] [place] [address] Main phone: [phone]  STI screening visit  Subjective:  Sarah Burke is a 44 y.o. female being seen today for an STI screening visit. The patient reports they do have symptoms.    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 She then had sex after her period ended and symptoms returned. Currently having mild discharge and irritation. Had mild abdominal pain last night.  Reproductive considerations Patient reports they are not pregnant  and not breastfeeding. They do not desire a pregnancy in the next year. Patient is currently using no method - no contraceptive precautions to prevent pregnancy. They reported they are not interested in discussing contraception today.    Patient's last menstrual period was 02/11/2024 (exact date).  Does the patient using douching products? No  Patient's routine cervical screening is up to date and next due September 2027.  See flowsheet for further details and programmatic requirements Hyperlink available at the top of the signed note in blue.  Flow sheet content below:  Pregnancy Intention Screening Does the  patient want to become pregnant in the next year?: No Does the patient's partner want to become pregnant in the next year?: N/A Would the patient like to discuss contraceptive options today?: No Reason For STD Screen STD Screening: Has symptoms Have you ever had an STD?: Yes History of Antibiotic use in the past 2 weeks?: No STD Symptoms Denies all: No Genital Itching: No Lower abdominal pain: Yes Discharge: Yes Dysuria: No Genital ulcer / lesion: No Rash: No Vaginal irritation: Yes Oral / Other skin ulcer: No Pain with sex: No Sore Throat: No Visual Changes: No Vaginal Bleeding: No Risk Factors for Hep B Household, sexual, or needle sharing contact of a person infected with Hep B: No Sexual contact with a person who uses drugs not as prescribed?: No Currently or Ever used drugs not as prescribed: No HIV Positive: No PRep Patient: No Men who have sex with men: No Have Hepatitis C: No History of Incarceration: No History of Homeslessness?: No Anal sex following anal drug use?: No Risk Factors for Hep C Currently using drugs not as prescribed: No Sexual partner(s) currently using drugs as not prescribed: No History of drug use: No HIV Positive: No People with a history of incarceration: No People born between the years of 58 and 7: No Abuse History Has patient ever been abused physically?: No Has patient ever been abused sexually?: No Does patient feel they have a problem with Anxiety?: Yes Does patient feel they have a problem with Depression?: Yes Counseling Patient counseled to use condoms with all sex: Condoms declined RTC in 2-3 weeks for test results: Yes Clinic will call if test  results abnormal before test result appt.: Yes Immunizations: Referred, Immunization history assessed Test results given to patient Patient counseled to use condoms with all sex: Condoms declined   Screening for MPX risk:  Unexplained rash?  No   MSM?  No   Multiple or  anonymous sex partners?  No   Any close or sexual contact with a person  diagnosed with MPX?  No   Any outside the [place]  where MPX is endemic?  No   High clinical suspicion for MPX?    -Unlikely to be chickenpox    -Lymphadenopathy    -Rash that presents in same phase of       evolution on any given body part  No   Does this patient meet CDC recommendations for vaccination against MPOX? No  You already have or anticipate having the following risks:  Your sex partner has the following risks: You're traveling to a county with a clade I MPOX outbreak and anticipate these risks: Occupational exposure  You had known or suspected exposure to someone with monkeypox You had a sex partner in the past 2 weeks who was diagnosed with monkeypox You are a gay, bisexual, or other man who has sex with men, or are transgender or nonbinary and in the past 6 months have had any of the following: - A new diagnosis of one or more sexually transmitted diseases (e.g., chlamydia, gonorrhea, or syphilis) - More than one sex partner You have had any of the following in the past 6 months: - Sex at a commercial sex venue (like a sex club or bathhouse) - Sex related to a large commercial event   or in a geographic area (city or county for example) where mpox virus transmission is occurring Sex with a new partner Sex at a commercial sex venue (e.g., a sex club or bathhouse) Sex in it consultant for money, goods, drugs, or other trade Sex in association with a large public event (e.g., a rave, party, or festival) i.e. certain people who work in a laboratory or healthcare facility   Infectious disease screenings: Vaccinated against HPV? No  HIV Ever had a positive? No Last test: 2023 Results in chart:  No results found for: HMHIVSCREEN  Lab Results  Component Value Date   HIV Non Reactive 07/10/2021   Hep B Hep B status: negative on 2023 Received HBV vaccination? Yes Received HBV testing for immunity? No Results  in chart:  No components found for: HMHEPBSCREEN  Do they qualify for HBV screening today? No Criteria:  -Household, sexual or needle sharing contact with HBV -History of drug use or homelessness -HIV positive -Those with known Hep C  Hep C Hep C status: negative on 2022 Results in chart:  No results found for: HMHEPCSCREEN No components found for: HEPC  Do they qualify for HCV screening today? No Criteria - since the last HCV result, does the patient have any of the following? - Current drug use - Have a partner with drug use - Has been incarcerated  Immunization history:  Immunization History  Administered Date(s) Administered   Hep A / Hep B 09/06/2007, 07/11/2015   Influenza,inj,Quad PF,6+ Mos 12/31/2017, 12/11/2020   Tdap 09/06/2007, 05/05/2013, 12/31/2017, 03/08/2021    The following portions of the patient's history were reviewed and updated as appropriate: allergies, current medications, past medical history, past social history, past surgical history and problem list.  Substance use screenings:  Uses tobacco products? No Uses vapes? No Uses alcohol? Yes, rare Uses non-injectable substances  that alter your mental status? No Uses non-prescribed injectable substances? No  Objective:  There were no vitals filed for this visit.  Physical Exam Vitals and nursing note reviewed.  Constitutional:      Appearance: Normal appearance.  HENT:     Head: Normocephalic and atraumatic.     Comments: No nits or hair loss on scalp, brows, and lashes    Mouth/Throat:     Mouth: Mucous membranes are moist.     Pharynx: Oropharynx is clear. No oropharyngeal exudate or posterior oropharyngeal erythema.  Eyes:     General:        Right eye: No discharge.        Left eye: No discharge.     Conjunctiva/sclera: Conjunctivae normal.     Right eye: Right conjunctiva is not injected.     Left eye: Left conjunctiva is not injected.  Pulmonary:     Effort: Pulmonary effort is  normal.  Abdominal:     Tenderness: There is no abdominal tenderness. There is no rebound.  Genitourinary:    Comments: Politely declined genital exam. Self collected pH: <4.5 Lymphadenopathy:     Cervical: No cervical adenopathy.     Upper Body:     Right upper body: No supraclavicular, axillary or epitrochlear adenopathy.     Left upper body: No supraclavicular, axillary or epitrochlear adenopathy.     Comments: Patient declines pelvic exam, inguinal lymph nodes not evaluated.  Skin:    General: Skin is warm and dry.     Findings: No lesion or rash.  Neurological:     Mental Status: She is alert and oriented to person, place, and time.  Psychiatric:        Mood and Affect: Mood normal.    Assessment and Plan:  Sarah Burke is a 44 y.o. female presenting to the [facility] Department for STI screening.  Patient accepted the following screenings: vaginal CT/GC swab, vaginal wet prep, HIV, and RPR  1. Screening examination for sexually transmitted disease  - WET PREP FOR TRICH, YEAST, CLUE - Chlamydia/Gonorrhea [facility] - HIV [facility] - Syphilis Serology, [facility]   2. Mood changes  - Ms. Hardcastle endorses anxiety and depression sx, situational - Accepted LCSW card   Counseling: Discussed time line for [facility] results and that patient will be called with positive results and encouraged patient to call if they had not Burke in 2 weeks.  Counseled to return or seek care for continued or worsening symptoms Recommended repeat testing in 3 months with positive results. Recommended condom use with all sex for STI prevention.   Return if symptoms worsen or fail to improve.  No future appointments.  Damien FORBES Satchel, NP "

## 2024-02-24 NOTE — Progress Notes (Signed)
 Pt is here for STD screening. Wet prep results reviewed with patient and requires no treatment per SO. Opportunity given to patient to ask questions for any clarifications. Questions answered.  Condoms declined. Kwadwo Tonishia Steffy,RN.

## 2024-03-15 ENCOUNTER — Telehealth: Payer: Self-pay | Admitting: Family Medicine
